# Patient Record
Sex: Male | Born: 1935
Health system: Southern US, Community
[De-identification: ages and names within clinical notes are randomized; demographics above are authoritative.]

## PROBLEM LIST (undated history)

## (undated) DIAGNOSIS — I4891 Unspecified atrial fibrillation: Secondary | ICD-10-CM

## (undated) DIAGNOSIS — R51 Headache: Secondary | ICD-10-CM

## (undated) DIAGNOSIS — K219 Gastro-esophageal reflux disease without esophagitis: Secondary | ICD-10-CM

## (undated) DIAGNOSIS — F419 Anxiety disorder, unspecified: Secondary | ICD-10-CM

## (undated) DIAGNOSIS — R200 Anesthesia of skin: Secondary | ICD-10-CM

## (undated) DIAGNOSIS — R202 Paresthesia of skin: Secondary | ICD-10-CM

## (undated) DIAGNOSIS — J984 Other disorders of lung: Secondary | ICD-10-CM

## (undated) DIAGNOSIS — I10 Essential (primary) hypertension: Secondary | ICD-10-CM

## (undated) DIAGNOSIS — Z720 Tobacco use: Secondary | ICD-10-CM

## (undated) DIAGNOSIS — J189 Pneumonia, unspecified organism: Secondary | ICD-10-CM

## (undated) DIAGNOSIS — J9601 Acute respiratory failure with hypoxia: Secondary | ICD-10-CM

## (undated) DIAGNOSIS — C3490 Malignant neoplasm of unspecified part of unspecified bronchus or lung: Secondary | ICD-10-CM

## (undated) DIAGNOSIS — J449 Chronic obstructive pulmonary disease, unspecified: Secondary | ICD-10-CM

## (undated) DIAGNOSIS — E7800 Pure hypercholesterolemia, unspecified: Secondary | ICD-10-CM

## (undated) DIAGNOSIS — C189 Malignant neoplasm of colon, unspecified: Secondary | ICD-10-CM

## (undated) DIAGNOSIS — J439 Emphysema, unspecified: Secondary | ICD-10-CM

## (undated) HISTORY — PX: EYE SURGERY: SHX253

## (undated) HISTORY — PX: APPENDECTOMY: SHX54

## (undated) HISTORY — PX: CERVICAL DISC SURGERY: SHX588

## (undated) HISTORY — DX: Malignant neoplasm of colon, unspecified: C18.9

## (undated) HISTORY — DX: Malignant neoplasm of unspecified part of unspecified bronchus or lung: C34.90

## (undated) HISTORY — PX: COLON SURGERY: SHX602

## (undated) HISTORY — PX: TONSILLECTOMY: SUR1361

---

## 1991-03-04 DIAGNOSIS — C189 Malignant neoplasm of colon, unspecified: Secondary | ICD-10-CM

## 1991-03-04 HISTORY — DX: Malignant neoplasm of colon, unspecified: C18.9

## 2002-08-16 ENCOUNTER — Ambulatory Visit (HOSPITAL_COMMUNITY): Admission: RE | Admit: 2002-08-16 | Discharge: 2002-08-16 | Payer: Self-pay | Admitting: Internal Medicine

## 2002-09-21 ENCOUNTER — Ambulatory Visit (HOSPITAL_COMMUNITY): Admission: RE | Admit: 2002-09-21 | Discharge: 2002-09-21 | Payer: Self-pay | Admitting: Family Medicine

## 2002-09-21 ENCOUNTER — Encounter: Payer: Self-pay | Admitting: Family Medicine

## 2002-10-17 ENCOUNTER — Encounter: Payer: Self-pay | Admitting: Neurosurgery

## 2002-10-18 ENCOUNTER — Inpatient Hospital Stay (HOSPITAL_COMMUNITY): Admission: RE | Admit: 2002-10-18 | Discharge: 2002-10-20 | Payer: Self-pay | Admitting: Neurosurgery

## 2002-10-18 ENCOUNTER — Encounter: Payer: Self-pay | Admitting: Neurosurgery

## 2003-09-29 ENCOUNTER — Ambulatory Visit (HOSPITAL_COMMUNITY): Admission: RE | Admit: 2003-09-29 | Discharge: 2003-09-29 | Payer: Self-pay | Admitting: Neurosurgery

## 2005-09-15 ENCOUNTER — Ambulatory Visit (HOSPITAL_COMMUNITY): Admission: RE | Admit: 2005-09-15 | Discharge: 2005-09-15 | Payer: Self-pay | Admitting: Family Medicine

## 2007-09-30 ENCOUNTER — Ambulatory Visit: Payer: Self-pay | Admitting: Internal Medicine

## 2007-09-30 ENCOUNTER — Ambulatory Visit (HOSPITAL_COMMUNITY): Admission: RE | Admit: 2007-09-30 | Discharge: 2007-09-30 | Payer: Self-pay | Admitting: Internal Medicine

## 2007-09-30 ENCOUNTER — Encounter: Payer: Self-pay | Admitting: Internal Medicine

## 2008-01-11 ENCOUNTER — Ambulatory Visit (HOSPITAL_COMMUNITY): Admission: RE | Admit: 2008-01-11 | Discharge: 2008-01-11 | Payer: Self-pay | Admitting: Ophthalmology

## 2008-01-25 ENCOUNTER — Ambulatory Visit (HOSPITAL_COMMUNITY): Admission: RE | Admit: 2008-01-25 | Discharge: 2008-01-25 | Payer: Self-pay | Admitting: Ophthalmology

## 2009-01-17 ENCOUNTER — Ambulatory Visit (HOSPITAL_COMMUNITY): Admission: RE | Admit: 2009-01-17 | Discharge: 2009-01-17 | Payer: Self-pay | Admitting: Family Medicine

## 2009-10-23 ENCOUNTER — Ambulatory Visit (HOSPITAL_COMMUNITY): Admission: RE | Admit: 2009-10-23 | Discharge: 2009-10-23 | Payer: Self-pay | Admitting: Ophthalmology

## 2009-12-18 ENCOUNTER — Ambulatory Visit (HOSPITAL_COMMUNITY): Admission: RE | Admit: 2009-12-18 | Discharge: 2009-12-18 | Payer: Self-pay | Admitting: Ophthalmology

## 2010-07-16 NOTE — Op Note (Signed)
NAME:  Adam Ward, Adam Ward                  ACCOUNT NO.:  [id number]   MEDICAL RECORD NO.:  [id number]          PATIENT TYPE:  AMB   LOCATION:  DAY                           FACILITY:  [facility]   PHYSICIAN:  R. Roetta Sessions, M.D. DATE OF BIRTH:  November 06, 1935   DATE OF PROCEDURE:  09/30/2007  DATE OF DISCHARGE:                               OPERATIVE REPORT   INDICATIONS FOR PROCEDURE:  A 75 year old gentleman with history of left-  sided colon cancer, status post segmental resection back in 1993 by Dr.  Malvin Johns.  He has done well, had surveillance examination in 2004 by Dr.  Ferne Reus without significant findings.  He has no lower GI tract symptoms.  Colonoscopy is now being done as a surveillance maneuver.  Risks,  benefits, alternatives, and limitations have been reviewed, and  questions were answered.  He is agreeable.  Please see the documentation  in the medical record.   PROCEDURE NOTE:  O2 saturation, blood pressure, pulse, and respirations  were monitored throughout the entire procedure.   CONSCIOUS SEDATION:  IV Versed and Demerol in incremental doses.   INSTRUMENT:  Pentax video chip system.   FINDINGS:  Digital rectal exam revealed no abnormalities.  Endoscopic  findings:  Prep was adequate.  Colon:  Colonic mucosa was surveyed from  the rectosigmoid junction through the colon all the way to the cecum.  The cecum, ileocecal valve, and appendiceal orifice were well seen and  photographed for the record.  Terminal ileum was then measured at 5 cm  from this level.  The scope was slowly and cautiously withdrawn.  All  previously mentioned mucosal surfaces were again seen.  The patient had  2 diminutive polyps just distal to the ileocecal valve, which were cold  biopsied/removed.  The patient had scattered distal diverticula.  The  surgical anatomosis was identified approximately at 25 cm, appeared  entirely normal as did the remainder of the colonic mucosa.  The scope  was pulled down to the  rectum with thorough examination of rectal mucosa  including retroflexed view of the anal verge demonstrated anal papilla.  The patient tolerated the procedure well and was reacted in Endoscopy.   IMPRESSION:  1. Anal papilla, otherwise normal rectum.  2. Distal colonic diverticula.  Surgical anatomosis, 25 cm diminutive      colonic polyps at the ileocecal valve, status post cold biopsy      removal.  3. Residual colonic mucosa and terminal ileum mucosa appeared normal.   RECOMMENDATIONS:  1. Diverticulosis literature provided to Adam Ward.  2. Follow up on path.  3. Further recommendations to follow.      Adam Ward, M.D.  Electronically Signed     RMR/MEDQ  D:  09/30/2007  T:  10/01/2007  Job:  [id number]   cc:   Patrica Duel, M.D.  Fax: [phone]   Pattricia Boss, M.D.  Fax: [phone]

## 2010-07-19 NOTE — Op Note (Signed)
NAME:  Adam Ward, Adam Ward                            ACCOUNT NO.:  [id number]   MEDICAL RECORD NO.:  [id number]                   PATIENT TYPE:  INP   LOCATION:  3008                                 FACILITY:  [facility]   PHYSICIAN:  Clydene Fake, M.D.               DATE OF BIRTH:  27-May-1935   DATE OF PROCEDURE:  10/18/2002  DATE OF DISCHARGE:                                 OPERATIVE REPORT   PREOPERATIVE DIAGNOSIS:  Herniated nucleus pulposus, spondylosis, stenosis  and cord compression with myelopathy, C3 through C7.   POSTOPERATIVE DIAGNOSIS:  Herniated nucleus pulposus, spondylosis, stenosis  and cord compression with myelopathy, C3 through C7.   OPERATION PERFORMED:  Anterior cervical decompression diskectomy and fusion  C3-4, 4-5, 5-6, 6-7 with LifeNet allograft bone and Premiere anterior  cervical plate from C3 through 7.   SURGEON:  Clydene Fake, M.D.   ASSISTANT:  Payton Doughty, M.D.   ANESTHESIA:  General endotracheal.   ESTIMATED BLOOD LOSS:  Minimal.   BLOOD REPLACED:  None.   DRAINS:  None.   COMPLICATIONS:  None.   INDICATIONS FOR PROCEDURE:  The patient is a 75 year old gentleman who has  been having trouble using the arms to walk with numbness and tingling in the  arms and legs progressing over the last couple of years with sloppiness in  his hands.  He did have a prior injury 30 years ago having 30 minutes of  paralysis of the legs.  Even after that he was always a little clumsy but he  was able to work and these new problems have been slowly progressive over  the last couple of years and over the last six months but progressively  more.  MRI was done showing spondylosis, cervical stenosis, cord  compression, with some cord change at 3-4 and significant changes also and  stenosis also at 4-5, 5-6 and 6-7.  Patient brought in for decompression and  fusion of the cervical spine.  Patient was myelopathic on exam.   DESCRIPTION OF PROCEDURE:  The patient was  brought to the operating room.  General anesthesia was induced.  Patient was placed in 10 pounds of halter  traction, prepped and draped in sterile fashion.  The site of incision was  injected with 19mL of 1% lidocaine with epinephrine.  Incision was then made  over the anterior border of the sternocleidomastoid muscle on the left side  of the neck in an oblique fashion.  Incision taken down to the platysma.  Hemostasis obtained with Bovie cautery.  Platysma was incised with a Bovie  and blunt dissection was taken through the anterior cervical fascia to the  anterior cervical spine.  Needle was placed in the two interspaces.  X-ray  was performed showing this was the 4-5 and 5-6 interspaces.  Disk spaces  were incised and partial diskectomy was performed with pituitary rongeurs as  the  needles were removed to mark these interspaces.  The longus colli muscle  was then reflected laterally on each side using the Bovie and self-retaining  retractor system was placed centered over the 3-4 and 4-5 interspaces to  start with.  Distraction pins were placed in C3 and C5, interspace  distracted, osteophytes removed with Leksell rongeurs and osteophyte  removers and high speed drill.  Interspaces then had diskectomy performed  with pituitary rongeurs and curets and high speed drill.  Posterior  osteophytes and very thickened posterior longitudinal ligament was removed  with 1 and 2 mm Kerrison punches and bilateral foraminotomies were  performed.  This was done at 3-4 and then at 4-5.  When we were finished, we  had good central decompression of the cord and bilateral foraminotomies.  A  high speed drill was used to remove the cartilaginous end plates at both  levels.  Interspaces were measured with LifeNet bone trials and they  measured to be 7 mm wide at both levels.  Wound irrigated with antibiotic  solution.  Two 7 mm LifeNet bone grafts were then tapped into place  countersunk a couple of  millimeters in both the 3-4 and 4-5 levels.  We  checked behind the bone graft and there was plenty of room between the bone  graft and dura at both levels.  Distraction pin was removed from C3 and  placed into C7, removed the retractor to recenter it over the 5-6 and 6-7  interspaces and then distracted over those two interspaces.  We incised  those two disk spaces with a 15 blade and partial diskectomy performed with  a pituitary rongeurs and curets.  A high speed drill was used to remove  cartilaginous end plates and drill  down to the posterior longitudinal  ligament then the 1 and 2 mm Kerrison punches were used to complete the  diskectomy and decompression to remove the posterior ligaments and perform  bilateral foraminotomies. When we were finished we had good central  decompression and good foraminotomies bilaterally at both levels.  We  measured the interspaces for size of bone grafts and they measured for 6 mm  grafts. A 6 mm graft was then tapped into the 5-6 space and then into the 6-  7 space, countersunk a couple of millimeters and we did check behind the  bone graft and there was plenty of room between bone and dura bilaterally.  We irrigated with antibiotic solution.  Hemostasis obtained with Gelfoam and  Thrombin and irrigated out.  Distraction pins were removed.  Hemostasis in  the bone obtained with Gelfoam and thrombin.  We removed some more  osteophytes so the plate would lie flat and then a Premiere anterior  cervical plate was placed over the anterior cervical spine and two screws  were placed in C7 and two screws were placed into C3.  We then placed two  screws into C4 and C6.  The locking mechanism was pushed back cephalad an  the locking screws tightened.  Wound was irrigated with antibiotic solution,  retraction devices were removed.  Hemostasis obtained with bipolar cautery. Wound was irrigated with antibiotic solution.  X-rays obtained showing good  position of   plate, screws, bone graft at C3-4, 4-5, 5-6 and 6-7.  When we  had good hemostasis the platysma was closed with 3-0 Vicryl interrupted  suture.  The subcutaneous tissue was closed with the same and the skin  closed with benzoin and Steri-Strips.  Dressing was placed.  The patient was  placed into a hard cervical collar, awakened from anesthesia and transferred  to the recovery room in stable condition.                                                Clydene Fake, M.D.    JRH/MEDQ  D:  10/18/2002  T:  10/18/2002  Job:  [id number]

## 2010-07-19 NOTE — Op Note (Signed)
NAME:  Adam Ward, Adam Ward                            ACCOUNT NO.:  [id number]   MEDICAL RECORD NO.:  [id number]                   PATIENT TYPE:  AMB   LOCATION:  DAY                                  FACILITY:  [facility]   PHYSICIAN:  Lionel December, M.D.                 DATE OF BIRTH:  18-May-1935   DATE OF PROCEDURE:  08/16/2002  DATE OF DISCHARGE:                                 OPERATIVE REPORT   PROCEDURE:  Total colonoscopy.   INDICATIONS FOR PROCEDURE:  Kole is a 75 year old Caucasian male who is  here for surveillance colonoscopy.  He has history of colon carcinoma.  He  had a sigmoid colon resection back in 1993.  He also had a few small polyps  removed.  His last exam was in May, 2000.  He is presently free of GI  symptoms.  The procedure was reviewed with the patient, and informed consent  was obtained.   PREOPERATIVE MEDICATIONS:  Demerol 45 mg IV, Versed 3 mg IV in divided  doses.   FINDINGS:  The procedure was performed in the endoscopy suite.  The  patient's vital signs and O2 saturations were monitored during the procedure  and remained stable.  The patient was placed in the left lateral recumbent  position and rectal examination performed.  No abnormality noted on external  or digital exam.  The Olympus videoduodenoscope was placed into the rectum  and advanced to the region of the sigmoid colon.  The colonic anastomosis  was noted at 25 cm from the anal margin and was wide open.  He had a few  scattered diverticula mainly at his sigmoid, descending, and transverse  colon.  The preparation was satisfactory.  The scope was passed into the  cecum which was identified by the appendiceal orifice and ileocecal valve.  Pictures were taken for the record.  As the scope was withdrawn, the colonic  mucosa was once again carefully examined.  There were no polyps or tumor  masses.  The rectal mucosa similarly was normal.  The scope was retroflexed  to examine the anorectal junction.  Prominent anal papillae were noted along  with hemorrhoids below the dentate line.  The endoscope was straightened and  withdrawn.  The patient tolerated the procedure well.   FINAL DIAGNOSES:  1. Examination performed to the cecum.  2. Scattered diverticula mainly at sigmoid, descending and transverse colon.  3. Wide open colonic anastomosis at 25 cm from the anal margin.  4. Prominent anal papillae and external hemorrhoids.   RECOMMENDATIONS:  1. High fiber diet.  2.     Citrucel or equivalent, one tablespoon full daily.  3. He will resume his ASA and other medications as before.  4. He may consider his next colonoscopy in five years.  Lionel December, M.D.    NR/MEDQ  D:  08/16/2002  T:  08/16/2002  Job:  [id number]   cc:   Barbaraann Barthel, M.D.  [address]  [place]  [place] [address]  Fax: [phone]   Patrica Duel, M.D.  [address], [address]  [place]  [place] [address]  Fax: [phone]

## 2010-12-03 LAB — HEMOGLOBIN AND HEMATOCRIT, BLOOD: HCT: 49.5

## 2010-12-03 LAB — BASIC METABOLIC PANEL
CO2: 32
Calcium: 9.4
Creatinine, Ser: 0.96
GFR calc Af Amer: >60

## 2011-07-26 ENCOUNTER — Inpatient Hospital Stay (HOSPITAL_COMMUNITY)
Admission: EM | Admit: 2011-07-26 | Discharge: 2011-08-01 | DRG: 871 | Disposition: A | Discharge status: Home / Self Care | Payer: Medicare Other | Attending: Internal Medicine | Admitting: Internal Medicine

## 2011-07-26 ENCOUNTER — Encounter (HOSPITAL_COMMUNITY): Payer: Self-pay | Admitting: *Deleted

## 2011-07-26 ENCOUNTER — Emergency Department (HOSPITAL_COMMUNITY): Payer: Medicare Other

## 2011-07-26 DIAGNOSIS — I10 Essential (primary) hypertension: Secondary | ICD-10-CM | POA: Diagnosis present

## 2011-07-26 DIAGNOSIS — J984 Other disorders of lung: Secondary | ICD-10-CM | POA: Diagnosis present

## 2011-07-26 DIAGNOSIS — R509 Fever, unspecified: Secondary | ICD-10-CM | POA: Diagnosis present

## 2011-07-26 DIAGNOSIS — J690 Pneumonitis due to inhalation of food and vomit: Secondary | ICD-10-CM | POA: Diagnosis not present

## 2011-07-26 DIAGNOSIS — A419 Sepsis, unspecified organism: Secondary | ICD-10-CM | POA: Diagnosis not present

## 2011-07-26 DIAGNOSIS — R0902 Hypoxemia: Secondary | ICD-10-CM

## 2011-07-26 DIAGNOSIS — F419 Anxiety disorder, unspecified: Secondary | ICD-10-CM | POA: Diagnosis present

## 2011-07-26 DIAGNOSIS — J9601 Acute respiratory failure with hypoxia: Secondary | ICD-10-CM

## 2011-07-26 DIAGNOSIS — J9600 Acute respiratory failure, unspecified whether with hypoxia or hypercapnia: Secondary | ICD-10-CM | POA: Diagnosis present

## 2011-07-26 DIAGNOSIS — R739 Hyperglycemia, unspecified: Secondary | ICD-10-CM

## 2011-07-26 DIAGNOSIS — J189 Pneumonia, unspecified organism: Secondary | ICD-10-CM | POA: Diagnosis not present

## 2011-07-26 DIAGNOSIS — J4489 Other specified chronic obstructive pulmonary disease: Secondary | ICD-10-CM | POA: Diagnosis not present

## 2011-07-26 DIAGNOSIS — J439 Emphysema, unspecified: Secondary | ICD-10-CM | POA: Diagnosis not present

## 2011-07-26 DIAGNOSIS — J159 Unspecified bacterial pneumonia: Secondary | ICD-10-CM

## 2011-07-26 DIAGNOSIS — F411 Generalized anxiety disorder: Secondary | ICD-10-CM | POA: Diagnosis present

## 2011-07-26 DIAGNOSIS — R059 Cough, unspecified: Secondary | ICD-10-CM | POA: Diagnosis present

## 2011-07-26 DIAGNOSIS — Z7982 Long term (current) use of aspirin: Secondary | ICD-10-CM | POA: Diagnosis not present

## 2011-07-26 DIAGNOSIS — I2789 Other specified pulmonary heart diseases: Secondary | ICD-10-CM | POA: Diagnosis present

## 2011-07-26 DIAGNOSIS — F17200 Nicotine dependence, unspecified, uncomplicated: Secondary | ICD-10-CM | POA: Diagnosis present

## 2011-07-26 DIAGNOSIS — R918 Other nonspecific abnormal finding of lung field: Secondary | ICD-10-CM

## 2011-07-26 DIAGNOSIS — I4891 Unspecified atrial fibrillation: Secondary | ICD-10-CM | POA: Diagnosis present

## 2011-07-26 DIAGNOSIS — R5381 Other malaise: Secondary | ICD-10-CM | POA: Diagnosis present

## 2011-07-26 DIAGNOSIS — Z85038 Personal history of other malignant neoplasm of large intestine: Secondary | ICD-10-CM

## 2011-07-26 DIAGNOSIS — J440 Chronic obstructive pulmonary disease with acute lower respiratory infection: Secondary | ICD-10-CM | POA: Diagnosis not present

## 2011-07-26 DIAGNOSIS — C3410 Malignant neoplasm of upper lobe, unspecified bronchus or lung: Secondary | ICD-10-CM | POA: Diagnosis present

## 2011-07-26 DIAGNOSIS — R7989 Other specified abnormal findings of blood chemistry: Secondary | ICD-10-CM

## 2011-07-26 DIAGNOSIS — R7309 Other abnormal glucose: Secondary | ICD-10-CM | POA: Diagnosis present

## 2011-07-26 DIAGNOSIS — J209 Acute bronchitis, unspecified: Secondary | ICD-10-CM | POA: Diagnosis not present

## 2011-07-26 DIAGNOSIS — R0602 Shortness of breath: Secondary | ICD-10-CM

## 2011-07-26 DIAGNOSIS — Z720 Tobacco use: Secondary | ICD-10-CM

## 2011-07-26 DIAGNOSIS — R05 Cough: Secondary | ICD-10-CM | POA: Diagnosis present

## 2011-07-26 DIAGNOSIS — J988 Other specified respiratory disorders: Secondary | ICD-10-CM | POA: Diagnosis not present

## 2011-07-26 DIAGNOSIS — J449 Chronic obstructive pulmonary disease, unspecified: Secondary | ICD-10-CM | POA: Diagnosis not present

## 2011-07-26 DIAGNOSIS — J438 Other emphysema: Secondary | ICD-10-CM | POA: Diagnosis not present

## 2011-07-26 HISTORY — DX: Essential (primary) hypertension: I10

## 2011-07-26 HISTORY — DX: Chronic obstructive pulmonary disease, unspecified: J44.9

## 2011-07-26 HISTORY — DX: Other disorders of lung: J98.4

## 2011-07-26 HISTORY — DX: Acute respiratory failure with hypoxia: J96.01

## 2011-07-26 HISTORY — DX: Tobacco use: Z72.0

## 2011-07-26 HISTORY — DX: Pure hypercholesterolemia, unspecified: E78.00

## 2011-07-26 HISTORY — DX: Unspecified atrial fibrillation: I48.91

## 2011-07-26 HISTORY — DX: Emphysema, unspecified: J43.9

## 2011-07-26 HISTORY — DX: Anxiety disorder, unspecified: F41.9

## 2011-07-26 LAB — COMPREHENSIVE METABOLIC PANEL
ALT: 12 U/L (ref 0–53)
AST: 16 U/L (ref 0–37)
Albumin: 2.9 g/dL — ABNORMAL LOW (ref 3.5–5.2)
Alkaline Phosphatase: 70 U/L (ref 39–117)
Chloride: 95 mEq/L — ABNORMAL LOW (ref 96–112)
Potassium: 4 mEq/L (ref 3.5–5.1)
Sodium: 134 mEq/L — ABNORMAL LOW (ref 135–145)
Total Bilirubin: 0.2 mg/dL — ABNORMAL LOW (ref 0.3–1.2)
Total Protein: 6.9 g/dL (ref 6.0–8.3)

## 2011-07-26 LAB — DIFFERENTIAL
Basophils Absolute: 0 10*3/uL (ref 0.0–0.1)
Basophils Relative: 0 % (ref 0–1)
Eosinophils Absolute: 0 10*3/uL (ref 0.0–0.7)
Eosinophils Relative: 0 % (ref 0–5)
Monocytes Absolute: 1.6 10*3/uL — ABNORMAL HIGH (ref 0.1–1.0)
Neutro Abs: 8.7 10*3/uL — ABNORMAL HIGH (ref 1.7–7.7)

## 2011-07-26 LAB — CBC
HCT: 45.3 % (ref 39.0–52.0)
MCH: 31.6 pg (ref 26.0–34.0)
MCHC: 32.9 g/dL (ref 30.0–36.0)
RDW: 13 % (ref 11.5–15.5)

## 2011-07-26 LAB — TROPONIN I: Troponin I: <0.3 ng/mL (ref <0.30)

## 2011-07-26 LAB — URINE MICROSCOPIC-ADD ON

## 2011-07-26 LAB — PRO B NATRIURETIC PEPTIDE: Pro B Natriuretic peptide (BNP): 2495 pg/mL — ABNORMAL HIGH (ref 0–125)

## 2011-07-26 LAB — URINALYSIS, ROUTINE W REFLEX MICROSCOPIC
Bilirubin Urine: NEGATIVE
Specific Gravity, Urine: >1.03 — ABNORMAL HIGH (ref 1.005–1.030)
pH: 6 (ref 5.0–8.0)

## 2011-07-26 LAB — CK TOTAL AND CKMB (NOT AT ARMC): Relative Index: INVALID (ref 0.0–2.5)

## 2011-07-26 MED ORDER — SIMVASTATIN 20 MG PO TABS
20.0000 mg | ORAL_TABLET | Freq: Every day | ORAL | Status: DC
  Administered 2011-07-27 – 2011-07-28 (×2): 20 mg via ORAL
  Filled 2011-07-26 (×2): qty 1

## 2011-07-26 MED ORDER — SODIUM CHLORIDE 0.9 % IV SOLN
1000.0000 mL | INTRAVENOUS | Status: DC
  Administered 2011-07-26: 1000 mL via INTRAVENOUS

## 2011-07-26 MED ORDER — SODIUM CHLORIDE 0.9 % IV SOLN
1000.0000 mL | Freq: Once | INTRAVENOUS | Status: AC
  Administered 2011-07-26: 1000 mL via INTRAVENOUS

## 2011-07-26 MED ORDER — IPRATROPIUM BROMIDE 0.02 % IN SOLN
0.5000 mg | Freq: Once | RESPIRATORY_TRACT | Status: AC
  Administered 2011-07-26: 0.5 mg via RESPIRATORY_TRACT
  Filled 2011-07-26: qty 2.5

## 2011-07-26 MED ORDER — DEXTROSE 5 % IV SOLN
500.0000 mg | Freq: Once | INTRAVENOUS | Status: AC
  Administered 2011-07-26: 500 mg via INTRAVENOUS
  Filled 2011-07-26: qty 500

## 2011-07-26 MED ORDER — ACETAMINOPHEN 500 MG PO TABS
1000.0000 mg | ORAL_TABLET | Freq: Once | ORAL | Status: AC
  Administered 2011-07-26: 1000 mg via ORAL
  Filled 2011-07-26: qty 2

## 2011-07-26 MED ORDER — CEFTRIAXONE SODIUM IN DEXTROSE 40 MG/ML IV SOLN
2.0000 g | Freq: Once | INTRAVENOUS | Status: AC
  Administered 2011-07-26: 2 g via INTRAVENOUS
  Filled 2011-07-26: qty 50

## 2011-07-26 MED ORDER — POTASSIUM CHLORIDE IN NACL 20-0.9 MEQ/L-% IV SOLN
INTRAVENOUS | Status: DC
  Administered 2011-07-26: 1000 mL via INTRAVENOUS

## 2011-07-26 MED ORDER — DEXTROSE 5 % IV SOLN
500.0000 mg | INTRAVENOUS | Status: DC
  Administered 2011-07-27 – 2011-07-28 (×2): 500 mg via INTRAVENOUS
  Filled 2011-07-26 (×2): qty 500

## 2011-07-26 MED ORDER — METHYLPREDNISOLONE SODIUM SUCC 125 MG IJ SOLR
125.0000 mg | Freq: Once | INTRAMUSCULAR | Status: AC
  Administered 2011-07-26: 125 mg via INTRAVENOUS
  Filled 2011-07-26: qty 2

## 2011-07-26 MED ORDER — DEXTROSE 5 % IV SOLN
INTRAVENOUS | Status: AC
  Filled 2011-07-26: qty 2

## 2011-07-26 MED ORDER — ASPIRIN EC 81 MG PO TBEC
81.0000 mg | DELAYED_RELEASE_TABLET | Freq: Every day | ORAL | Status: DC
  Administered 2011-07-27 – 2011-08-01 (×6): 81 mg via ORAL
  Filled 2011-07-26 (×6): qty 1

## 2011-07-26 MED ORDER — DEXTROSE 5 % IV SOLN
1.0000 g | INTRAVENOUS | Status: DC
  Administered 2011-07-27: 1 g via INTRAVENOUS
  Filled 2011-07-26 (×2): qty 10

## 2011-07-26 MED ORDER — ALBUTEROL SULFATE (5 MG/ML) 0.5% IN NEBU
2.5000 mg | INHALATION_SOLUTION | RESPIRATORY_TRACT | Status: DC | PRN
Start: 2011-07-26 — End: 2011-07-27
  Administered 2011-07-27: 2.5 mg via RESPIRATORY_TRACT
  Filled 2011-07-26: qty 0.5

## 2011-07-26 MED ORDER — ALBUTEROL SULFATE (5 MG/ML) 0.5% IN NEBU
5.0000 mg | INHALATION_SOLUTION | Freq: Once | RESPIRATORY_TRACT | Status: AC
  Administered 2011-07-26: 5 mg via RESPIRATORY_TRACT
  Filled 2011-07-26: qty 1

## 2011-07-26 MED ORDER — METOPROLOL TARTRATE 50 MG PO TABS
50.0000 mg | ORAL_TABLET | Freq: Two times a day (BID) | ORAL | Status: DC
  Administered 2011-07-27: 50 mg via ORAL
  Filled 2011-07-26: qty 1

## 2011-07-26 NOTE — ED Notes (Signed)
In and out cath performed using aseptic teqnique. Pt tolerated well. urine obtained. Sample sent to lab

## 2011-07-26 NOTE — ED Notes (Signed)
Pt c/o chest congestion and weakness for a couple days. Has congested cough at times.

## 2011-07-26 NOTE — H&P (Signed)
Chief Complaint:  Shortness of breath  HPI: Pleasant 76 year old male who is not very forthcoming with any history and says that he is feeling perfectly fine those forced to come to the hospital by his wife because of worsening shortness of breath and cough for several days now. He denies any shortness of breath, chest pain, fevers, feeling poorly. However when you look at him he obviously appears sick and short of breath and was quite hypoxic in the emergency department with O2 sats in the 60s on room air. Per his wife he has been progressively getting weaker has been running fever she states that his mental status has been normal. But he definitely has not been himself and finally made him come to the hospital today. Review of systems is unreliable from the patient and negative due to a believe the patient's stoic nature.  Review of Systems:  Negative but unreliable  Past Medical History: Past Medical History  Diagnosis Date  . Hypertension   . High cholesterol   . Anxiety   . Cancer    Past Surgical History  Procedure Date  . Colon surgery   . Cervical disc surgery   . Tonsillectomy   . Appendectomy     Medications: Prior to Admission medications   Medication Sig Start Date End Date Taking? Authorizing Provider  alprazolam Prudy Feeler) 2 MG tablet Take 2 mg by mouth 3 (three) times daily.   Yes Historical Provider, MD  aspirin EC 81 MG tablet Take 81 mg by mouth daily.   Yes Historical Provider, MD  metoprolol (LOPRESSOR) 50 MG tablet Take 50 mg by mouth 2 (two) times daily.   Yes Historical Provider, MD  Multiple Vitamins-Minerals (MULTIVITAMIN WITH MINERALS) tablet Take 1 tablet by mouth daily.   Yes Historical Provider, MD  Omega-3 Fatty Acids (FISH OIL) 1000 MG CAPS Take 1 capsule by mouth daily.   Yes Historical Provider, MD  simvastatin (ZOCOR) 20 MG tablet Take 20 mg by mouth daily.   Yes Historical Provider, MD    Allergies:   Allergies  Allergen Reactions  . Celebrex  (Celecoxib)   . Tetracyclines & Related Hives    Social History:  reports that he has been smoking.  He does not have any smokeless tobacco history on file. He reports that he does not drink alcohol or use illicit drugs.  Family History: History reviewed. No pertinent family history.  Physical Exam: Filed Vitals:   07/26/11 2000 07/26/11 2012 07/26/11 2100 07/26/11 2200  BP: 161/69 161/69 175/68 154/70  Pulse: 120 133 121 113  Temp:    101.6 F (38.7 C)  TempSrc:    Rectal  Resp: 24 17 24 16   Height:      Weight:      SpO2: 97% 96% 96% 92%   BP 154/70  Pulse 113  Temp(Src) 100.2 F (37.9 C) (Oral)  Resp 16  Ht 6\' 2"  (1.88 m)  Wt 77.111 kg (170 lb)  BMI 21.83 kg/m2  SpO2 92% General appearance: alert, cooperative and mild distress Lungs: rhonchi bilaterally Heart: regular rate and rhythm, S1, S2 normal, no murmur, click, rub or gallop Abdomen: soft, non-tender; bowel sounds normal; no masses,  no organomegaly Extremities: extremities normal, atraumatic, no cyanosis or edema Pulses: 2+ and symmetric Skin: Skin color, texture, turgor normal. No rashes or lesions Neurologic: Grossly normal    Labs on Admission:   [facility] 07/26/11 1920  NA 134*  K 4.0  CL 95*  CO2 33*  GLUCOSE 165*  BUN 11  CREATININE 0.94  CALCIUM 8.7  MG --  PHOS --    Basename 07/26/11 1920  AST 16  ALT 12  ALKPHOS 70  BILITOT 0.2*  PROT 6.9  ALBUMIN 2.9*    Basename 07/26/11 1846  WBC 11.4*  NEUTROABS 8.7*  HGB 14.9  HCT 45.3  MCV 96.0  PLT 170    Basename 07/26/11 1920  CKTOTAL --  CKMB --  CKMBINDEX --  TROPONINI <0.30    Radiological Exams on Admission: Dg Chest Portable 1 View  07/26/2011  *RADIOLOGY REPORT*  Clinical Data: Sepsis.  Weakness.  PORTABLE CHEST - 1 VIEW  Comparison: 01/17/2009  Findings: Changes of COPD again demonstrated.  Right upper lobe scarring remains stable.  No evidence of acute infiltrate or edema. No evidence of pleural effusion.  Heart  size and mediastinal contours are normal.  IMPRESSION: Stable COPD.  No active disease.  Original Report Authenticated By: Danae Orleans, M.D.    Assessment/Plan Present on Admission:  76 year old male with likely community acquired pneumonia and early sepsis  .Fever .Cough .CAP (community acquired pneumonia) .Acute respiratory failure with hypoxia .Hypertension  Although his chest x-ray is negative he is running a fever has a significant cough and hypoxic respiratory failure and does appear somewhat toxic. Clinically he definitely has pneumonia. Blood cultures have been sent. Patient will be placed on Rocephin and azithromycin. His oxygen Sat are in the 90s on 4 L nasal cannula. He can speak in full sentences but is short of breath. We'll monitor very closely in step down. He is a full code and agrees to this if needed and only wishes temporary extreme measures. His wife and his daughter have been updated and they understand that the next 24-48 hours he can either improve or worsen. He was wheezing significantly on arrival to emergency department and did receive Solu-Medrol. He has no known history of COPD. He currently is not wheezing at all so we'll hold off on any further steroids and reassess in the morning. Due to the patient's very vague history we'll also cereal his cardiac enzymes and check a BNP he has no known cardiac issues as of yet. Further recommendations depending on response to the above.  Kambrey Hagger A [phone] 07/26/2011, 10:18 PM

## 2011-07-26 NOTE — ED Notes (Signed)
Notified dr. Fonnie Jarvis that patient's heart rate elevated up to 162. Dr. Fonnie Jarvis stated to continue giving patient fluids and continue to monitor him.

## 2011-07-26 NOTE — ED Provider Notes (Signed)
History   This chart was scribed for Adam Cousin, MD by Toya Smothers. The patient was seen in room IC08/IC08-01. Patient's care was started at 1740.  CSN: [id number]  Arrival date & time 07/26/11  1740   First MD Initiated Contact with Patient 07/26/11 1828      Chief Complaint  Patient presents with  . Weakness    The history is provided by the patient and the spouse. No language interpreter was used.    Pt. List PCP as Dr. Gordy Savers Gehling is a 76 y.o. male who presents to the Emergency Department complaining of gradual onset constant moderate severe weakness onset 3 days ago with associated SOB, moderate cough, and fatigue. Pt denies the confusion, chest pain, emesis, congestion, a h/o asthma and the use of an inhaler, along with the history of MI. The patient is currently living with his wife and list a h/o cancer. No Hx COPD or inhalers but does smoke.  Past Medical History  Diagnosis Date  . Hypertension   . High cholesterol   . Anxiety   . Cancer 1993    colon  . Rapid atrial fibrillation 07/28/2011    Past Surgical History  Procedure Date  . Colon surgery   . Cervical disc surgery   . Tonsillectomy   . Appendectomy     Family History  Problem Relation Age of Onset  . Heart disease Mother   . Stroke Father   . Pneumonia Sister   . Heart disease Brother     History  Substance Use Topics  . Smoking status: Current Everyday Smoker -- 0.5 packs/day  . Smokeless tobacco: Not on file  . Alcohol Use: No      Review of Systems  Constitutional: Negative for fever.       10 Systems reviewed and are negative for acute change except as noted in the HPI.  HENT: Negative for congestion.   Eyes: Negative for discharge and redness.  Respiratory: Negative for cough and shortness of breath.   Cardiovascular: Negative for chest pain.  Gastrointestinal: Negative for nausea, vomiting, abdominal pain and diarrhea.  Musculoskeletal: Negative for back pain.    Skin: Negative for rash.  Neurological: Positive for weakness. Negative for syncope, numbness and headaches.  Psychiatric/Behavioral: Negative for confusion.       No behavior change.    Allergies  Celebrex and Tetracyclines & related  Home Medications  No current outpatient prescriptions on file.  BP 158/61  Pulse 125  Temp(Src) 100.2 F (37.9 C) (Oral)  Resp 20  Ht 6\' 2"  (1.88 m)  Wt 170 lb (77.111 kg)  BMI 21.83 kg/m2  SpO2 66%  Physical Exam  Nursing note and vitals reviewed. Constitutional: He is oriented to person, place, and time.       Awake, alert, nontoxic appearance with baseline speech for patient.  HENT:  Head: Atraumatic.  Mouth/Throat: No oropharyngeal exudate.  Eyes: Right eye exhibits no discharge. Left eye exhibits no discharge.  Neck: Neck supple.  Cardiovascular: Regular rhythm and normal heart sounds.   No murmur heard.      Lum pulse oxidation saturation of 66%, Tachyacardic  Pulmonary/Chest: Effort normal. No stridor. No respiratory distress. He has wheezes. He has no rales. He exhibits no tenderness.  Abdominal: Soft. Bowel sounds are normal. He exhibits no mass. There is no tenderness. There is no rebound.  Musculoskeletal: He exhibits no tenderness.       Baseline ROM, moves extremities with no  obvious new focal weakness.  Lymphadenopathy:    He has no cervical adenopathy.  Neurological: He is alert and oriented to person, place, and time.  Skin: No rash noted.  Psychiatric: He has a normal mood and affect.    ED Course  Procedures (including critical care time) ECG: Sinus tachycardia, ventricular rate 122, normal axis, premature atrial complexes, incomplete right bundle branch block, no acute ischemic changes noted, compared with November 2009 rate is now faster DIAGNOSTIC STUDIES: Oxygen Saturation is 66% on [place], low by my interpretation.    COORDINATION OF CARE: 6:35PM- Discussed low oxygen saturation levels and admittance 7:37PM-  Reviewed vitals with nurses Pulse ox in 90s with nasal O2.  CRITICAL CARE Performed by: Hurman Horn   Total critical care time:  Critical care time was exclusive of separately billable procedures and treating other patients.  Critical care was necessary to treat or prevent imminent or life-threatening deterioration.  Critical care was time spent personally by me on the following activities: development of treatment plan with patient and/or surrogate as well as nursing, discussions with consultants, evaluation of patient's response to treatment, examination of patient, obtaining history from patient or surrogate, ordering and performing treatments and interventions, ordering and review of laboratory studies, ordering and review of radiographic studies, pulse oximetry and re-evaluation of patient's condition. Labs Reviewed  CBC - Abnormal; Notable for the following:    WBC 11.4 (*)    All other components within normal limits  DIFFERENTIAL - Abnormal; Notable for the following:    Neutro Abs 8.7 (*)    Lymphocytes Relative 9 (*)    Monocytes Relative 14 (*)    Monocytes Absolute 1.6 (*)    All other components within normal limits  COMPREHENSIVE METABOLIC PANEL - Abnormal; Notable for the following:    Sodium 134 (*)    Chloride 95 (*)    CO2 33 (*)    Glucose, Bld 165 (*)    Albumin 2.9 (*)    Total Bilirubin 0.2 (*)    GFR calc non Af Amer 81 (*)    All other components within normal limits  URINALYSIS, ROUTINE W REFLEX MICROSCOPIC - Abnormal; Notable for the following:    APPearance HAZY (*)    Specific Gravity, Urine >1.030 (*)    Glucose, UA 100 (*)    Hgb urine dipstick SMALL (*)    Protein, ur 100 (*)    All other components within normal limits  URINE MICROSCOPIC-ADD ON - Abnormal; Notable for the following:    Bacteria, UA MANY (*)    Casts HYALINE CASTS (*)    All other components within normal limits  BASIC METABOLIC PANEL - Abnormal; Notable for the  following:    Glucose, Bld 196 (*)    Calcium 7.9 (*)    GFR calc non Af Amer 87 (*)    All other components within normal limits  PRO B NATRIURETIC PEPTIDE - Abnormal; Notable for the following:    Pro B Natriuretic peptide (BNP) 2495.0 (*)    All other components within normal limits  D-DIMER, QUANTITATIVE - Abnormal; Notable for the following:    D-Dimer, Quant 2.50 (*)    All other components within normal limits  HEMOGLOBIN A1C - Abnormal; Notable for the following:    Hemoglobin A1C 6.0 (*)    Mean Plasma Glucose 126 (*)    All other components within normal limits  CBC - Abnormal; Notable for the following:    WBC 11.3 (*)  RBC 4.10 (*)    Hemoglobin 12.7 (*)    All other components within normal limits  BASIC METABOLIC PANEL - Abnormal; Notable for the following:    CO2 33 (*)    Glucose, Bld 141 (*)    GFR calc non Af Amer 87 (*)    All other components within normal limits  GLUCOSE, CAPILLARY - Abnormal; Notable for the following:    Glucose-Capillary 146 (*)    All other components within normal limits  GLUCOSE, CAPILLARY - Abnormal; Notable for the following:    Glucose-Capillary 124 (*)    All other components within normal limits  GLUCOSE, CAPILLARY - Abnormal; Notable for the following:    Glucose-Capillary 110 (*)    All other components within normal limits  GLUCOSE, CAPILLARY - Abnormal; Notable for the following:    Glucose-Capillary 106 (*)    All other components within normal limits  GLUCOSE, CAPILLARY - Abnormal; Notable for the following:    Glucose-Capillary 110 (*)    All other components within normal limits  CULTURE, BLOOD (ROUTINE X 2)  CULTURE, BLOOD (ROUTINE X 2)  LACTIC ACID, PLASMA  PROCALCITONIN  URINE CULTURE  TROPONIN I  MRSA PCR SCREENING  LEGIONELLA ANTIGEN, URINE  STREP PNEUMONIAE URINARY ANTIGEN  CARDIAC PANEL(CRET KIN+CKTOT+MB+TROPI)  CK TOTAL AND CKMB  CULTURE, RESPIRATORY  CULTURE, EXPECTORATED SPUTUM-ASSESSMENT  GRAM  STAIN  TSH  AFB CULTURE WITH SMEAR   Ct Angio Chest W/cm &/or Wo Cm  07/27/2011  *RADIOLOGY REPORT*  Clinical Data: 76 year old with hypoxia and elevated D-dimer.  CT ANGIOGRAPHY CHEST  Technique:  Multidetector CT imaging of the chest using the standard protocol during bolus administration of intravenous contrast. Multiplanar reconstructed images including MIPs were obtained and reviewed to evaluate the vascular anatomy.  Contrast: OMNIPAQUE IOHEXOL 350 MG/ML SOLN  Comparison: Chest radiograph 07/27/2011  Findings: There is no evidence for pulmonary embolism.  There is edema in the upper abdomen, particularly near the kidneys.  There is diffuse atherosclerotic disease involving the thoracic aorta but no evidence for a dissection.  There is a small amount of pleural thickening or fluid bilaterally.  Prominent prevascular lymph node measuring 0.9 cm on sequence #4, image 39.  Precarinal lymph node measures 0.9 cm in the short axis.  Subcarinal lymph node measures 1.5 cm on image 47.  The trachea and mainstem bronchi are patent. There is diffuse peribronchial thickening but this is most prominent in the right lower lobe and right middle lobe.  In fact, there is narrowing of the right lower lobe bronchus.  Diffuse interstitial thickening and parenchymal nodularity in the right lower lobe.  Subtle small nodular densities in the lower right upper lobe.  Emphysematous changes with irregular cavitary or bullous lesions in the right upper lobe.  Questionable 7 mm nodule in the right upper lobe on sequence five, image 35.  There appears to be scarring at the left lung apex.  There are pleural-based densities along the dependent aspect of the left lower lobe which is indeterminate but could be related to atelectasis.  Mild peripheral nodularity in the left lower lobe.  No acute bony abnormality.  IMPRESSION: Negative for acute pulmonary embolism.  Diffuse peribronchial thickening, particularly in the right lower  lobe where there is narrowing of the right lower lobe bronchus. There is diffuse interstitial and nodular disease in the right lower lung.  Findings are suspicious for an infectious etiology. There are similar more subtle findings in the right lower lobe and left  lower lobe.  There are scattered irregular opacities with bulla/cavitary formations throughout the right upper lung.  There is mild mediastinal lymphadenopathy, particularly in the subcarinal station.  Recommend follow-up Chest CT within 2-3 months to evaluate the irregular opacities and possible cavitary lesions in the right upper lung.  In addition, the chest lymphadenopathy should be followed.  Original Report Authenticated By: Richarda Overlie, M.D.   Dg Chest Port 1 View  07/27/2011  *RADIOLOGY REPORT*  Clinical Data: Evaluate pneumonia.  PORTABLE CHEST - 1 VIEW  Comparison: 07/26/2011 01/17/2009  Findings: There is slight respiratory motion at the lung bases. Underlying changes of emphysema are noted.  Probable stable scarring laterally in the right upper lobe, similar dating back to 2010.  There is a developing faint opacity at the right lung base, suspicious for pneumonia.  The left lung is clear.  No visible pneumothorax or pleural effusion.  Postsurgical changes of the lower cervical spine.  No acute bony abnormality.  IMPRESSION: Developing right basilar opacity suspicious for pneumonia. Emphysema.  Original Report Authenticated By: Britta Mccreedy, M.D.     1. Sepsis   2. Bronchitis with bronchospasm   3. Hypoxia      MDM  I personally performed the services described in this documentation, which was scribed in my presence. The recorded information has been reviewed and considered. Pt feels improved after observation and/or treatment in ED.Patient / Family / Caregiver informed of clinical course, understand medical decision-making process, and agree with plan.The patient appears reasonably stabilized for admission considering the current  resources, flow, and capabilities available in the ED at this time, and I doubt any other [facility] requiring further screening and/or treatment in the ED prior to admission.   Hurman Horn, MD 07/28/11 [id number]

## 2011-07-26 NOTE — ED Notes (Signed)
Attempted to call report on patient to icu, pam stated Adam Ward would call me back because she is in with a patient.

## 2011-07-27 ENCOUNTER — Inpatient Hospital Stay (HOSPITAL_COMMUNITY): Payer: Medicare Other

## 2011-07-27 DIAGNOSIS — J9600 Acute respiratory failure, unspecified whether with hypoxia or hypercapnia: Secondary | ICD-10-CM

## 2011-07-27 DIAGNOSIS — J159 Unspecified bacterial pneumonia: Secondary | ICD-10-CM

## 2011-07-27 DIAGNOSIS — R739 Hyperglycemia, unspecified: Secondary | ICD-10-CM | POA: Diagnosis present

## 2011-07-27 DIAGNOSIS — R7989 Other specified abnormal findings of blood chemistry: Secondary | ICD-10-CM | POA: Diagnosis present

## 2011-07-27 LAB — HEMOGLOBIN A1C: Mean Plasma Glucose: 126 mg/dL — ABNORMAL HIGH (ref <117)

## 2011-07-27 LAB — BASIC METABOLIC PANEL
CO2: 31 mEq/L (ref 19–32)
Calcium: 7.9 mg/dL — ABNORMAL LOW (ref 8.4–10.5)
Chloride: 100 mEq/L (ref 96–112)
Creatinine, Ser: 0.8 mg/dL (ref 0.50–1.35)
GFR calc Af Amer: >90 mL/min (ref >90)
Sodium: 135 mEq/L (ref 135–145)

## 2011-07-27 LAB — CARDIAC PANEL(CRET KIN+CKTOT+MB+TROPI)
Relative Index: INVALID (ref 0.0–2.5)
Total CK: 26 U/L (ref 7–232)

## 2011-07-27 LAB — STREP PNEUMONIAE URINARY ANTIGEN: Strep Pneumo Urinary Antigen: NEGATIVE

## 2011-07-27 MED ORDER — ALPRAZOLAM 0.25 MG PO TABS
0.2500 mg | ORAL_TABLET | Freq: Four times a day (QID) | ORAL | Status: DC | PRN
  Administered 2011-07-27 (×2): 0.25 mg via ORAL
  Filled 2011-07-27 (×2): qty 1

## 2011-07-27 MED ORDER — SODIUM CHLORIDE 0.9 % IJ SOLN
INTRAMUSCULAR | Status: AC
  Filled 2011-07-27: qty 3

## 2011-07-27 MED ORDER — FUROSEMIDE 40 MG PO TABS
40.0000 mg | ORAL_TABLET | Freq: Once | ORAL | Status: AC
  Administered 2011-07-27: 40 mg via ORAL
  Filled 2011-07-27: qty 1

## 2011-07-27 MED ORDER — ENOXAPARIN SODIUM 40 MG/0.4ML ~~LOC~~ SOLN
40.0000 mg | SUBCUTANEOUS | Status: DC
  Administered 2011-07-27 – 2011-08-01 (×6): 40 mg via SUBCUTANEOUS
  Filled 2011-07-27 (×7): qty 0.4

## 2011-07-27 MED ORDER — ALBUTEROL SULFATE (5 MG/ML) 0.5% IN NEBU
2.5000 mg | INHALATION_SOLUTION | Freq: Four times a day (QID) | RESPIRATORY_TRACT | Status: DC
  Administered 2011-07-27 (×2): 2.5 mg via RESPIRATORY_TRACT
  Filled 2011-07-27 (×2): qty 0.5

## 2011-07-27 MED ORDER — DILTIAZEM HCL 100 MG IV SOLR
5.0000 mg/h | INTRAVENOUS | Status: DC
  Administered 2011-07-27: 15 mg/h via INTRAVENOUS
  Administered 2011-07-27: 5 mg/h via INTRAVENOUS
  Administered 2011-07-28: 15 mg/h via INTRAVENOUS
  Filled 2011-07-27: qty 100

## 2011-07-27 MED ORDER — METOPROLOL TARTRATE 25 MG PO TABS
12.5000 mg | ORAL_TABLET | Freq: Two times a day (BID) | ORAL | Status: DC
  Administered 2011-07-27 (×2): 12.5 mg via ORAL
  Filled 2011-07-27 (×2): qty 1

## 2011-07-27 MED ORDER — LEVALBUTEROL HCL 0.63 MG/3ML IN NEBU
0.6300 mg | INHALATION_SOLUTION | Freq: Four times a day (QID) | RESPIRATORY_TRACT | Status: DC
  Administered 2011-07-27 – 2011-08-01 (×16): 0.63 mg via RESPIRATORY_TRACT
  Filled 2011-07-27 (×16): qty 3

## 2011-07-27 MED ORDER — DILTIAZEM LOAD VIA INFUSION
10.0000 mg | Freq: Once | INTRAVENOUS | Status: AC
  Administered 2011-07-27: 10 mg via INTRAVENOUS
  Filled 2011-07-27: qty 10

## 2011-07-27 MED ORDER — IOHEXOL 350 MG/ML SOLN
100.0000 mL | Freq: Once | INTRAVENOUS | Status: AC | PRN
  Administered 2011-07-27: 100 mL via INTRAVENOUS

## 2011-07-27 NOTE — Progress Notes (Signed)
Chart reviewed.  Subjective: Denies shortness of breath, cough, wheeze, chest pain or any other complaints.  Objective: Vital signs in last 24 hours: Filed Vitals:   07/27/11 0400 07/27/11 0500 07/27/11 0600 07/27/11 0817  BP: 101/58 100/53 108/59 127/60  Pulse: 77 73 75 83  Temp: 98 F (36.7 C)     TempSrc: Axillary     Resp: 17 15 15 15   Height:      Weight:      SpO2: 96% 95% 95% 93%   Weight change:   Intake/Output Summary (Last 24 hours) at 07/27/11 0858 Last data filed at 07/27/11 0600  Gross per 24 hour  Intake  592.5 ml  Output      0 ml  Net  592.5 ml   General: Asleep. Arousable. Weak appearing. Voice quiet. Will not provide much history. Lungs: Diminished throughout. Slight inspiratory and expiratory wheezes. No rhonchi or rales. Cardiovascular regular rate rhythm without murmurs gallops rubs Abdomen soft nontender nondistended Extremities sequential compression devices no clubbing cyanosis or edema  Lab Results: Basic Metabolic Panel:  Lab 07/27/11 4098 07/26/11 1920  NA 135 134*  K 5.0 4.0  CL 100 95*  CO2 31 33*  GLUCOSE 196* 165*  BUN 9 11  CREATININE 0.80 0.94  CALCIUM 7.9* 8.7  MG -- --  PHOS -- --   Liver Function Tests:  Lab 07/26/11 1920  AST 16  ALT 12  ALKPHOS 70  BILITOT 0.2*  PROT 6.9  ALBUMIN 2.9*   No results found for this basename: LIPASE:2,AMYLASE:2 in the last 168 hours No results found for this basename: AMMONIA:2 in the last 168 hours CBC:  Lab 07/26/11 1846  WBC 11.4*  NEUTROABS 8.7*  HGB 14.9  HCT 45.3  MCV 96.0  PLT 170   Cardiac Enzymes:  Lab 07/27/11 0329 07/26/11 1920  CKTOTAL 26 31  CKMB 2.2 1.9  CKMBINDEX -- --  TROPONINI <0.30 <0.30   BNP:  Lab 07/26/11 1920  PROBNP 2495.0*   D-Dimer: No results found for this basename: DDIMER:2 in the last 168 hours CBG: No results found for this basename: GLUCAP:6 in the last 168 hours Hemoglobin A1C: No results found for this basename: HGBA1C in the  last 168 hours Fasting Lipid Panel: No results found for this basename: CHOL,HDL,LDLCALC,TRIG,CHOLHDL,LDLDIRECT in the last 119 hours Thyroid Function Tests: No results found for this basename: TSH,T4TOTAL,FREET4,T3FREE,THYROIDAB in the last 168 hours Coagulation: No results found for this basename: LABPROT:4,INR:4 in the last 168 hours Anemia Panel: No results found for this basename: VITAMINB12,FOLATE,FERRITIN,TIBC,IRON,RETICCTPCT in the last 168 hours Urine Drug Screen: Drugs of Abuse  No results found for this basename: labopia, cocainscrnur, labbenz, amphetmu, thcu, labbarb    Alcohol Level: No results found for this basename: ETH:2 in the last 168 hours Urinalysis:  Lab 07/26/11 2144  COLORURINE YELLOW  LABSPEC >1.030*  PHURINE 6.0  GLUCOSEU 100*  HGBUR SMALL*  BILIRUBINUR NEGATIVE  KETONESUR NEGATIVE  PROTEINUR 100*  UROBILINOGEN 0.2  NITRITE NEGATIVE  LEUKOCYTESUR NEGATIVE   Micro Results: Recent Results (from the past 240 hour(s))  CULTURE, BLOOD (ROUTINE X 2)     Status: Normal (Preliminary result)   Collection Time   07/26/11  6:47 PM      Component Value Range Status Comment   Specimen Description BLOOD LEFT ARM   Final    Special Requests BOTTLES DRAWN AEROBIC AND ANAEROBIC 4CC EAC   Final    Culture NO GROWTH 1 DAY   Final  Report Status PENDING   Incomplete   CULTURE, BLOOD (ROUTINE X 2)     Status: Normal (Preliminary result)   Collection Time   07/26/11  6:49 PM      Component Value Range Status Comment   Specimen Description BLOOD LEFT ANTECUBITAL   Final    Special Requests BOTTLES DRAWN AEROBIC ONLY 8CC   Final    Culture NO GROWTH 1 DAY   Final    Report Status PENDING   Incomplete   MRSA PCR SCREENING     Status: Normal   Collection Time   07/27/11 12:30 AM      Component Value Range Status Comment   MRSA by PCR NEGATIVE  NEGATIVE  Final    Studies/Results: Dg Chest Portable 1 View  07/26/2011  *RADIOLOGY REPORT*  Clinical Data: Sepsis.   Weakness.  PORTABLE CHEST - 1 VIEW  Comparison: 01/17/2009  Findings: Changes of COPD again demonstrated.  Right upper lobe scarring remains stable.  No evidence of acute infiltrate or edema. No evidence of pleural effusion.  Heart size and mediastinal contours are normal.  IMPRESSION: Stable COPD.  No active disease.  Original Report Authenticated By: Danae Orleans, M.D.   Scheduled Meds:   . sodium chloride  1,000 mL Intravenous Once   Followed by  . sodium chloride  1,000 mL Intravenous Once  . acetaminophen  1,000 mg Oral Once  . albuterol  2.5 mg Nebulization QID  . albuterol  5 mg Nebulization Once  . aspirin EC  81 mg Oral Daily  . azithromycin  500 mg Intravenous Once  . azithromycin  500 mg Intravenous Q24H  . cefTRIAXone (ROCEPHIN)  IV  1 g Intravenous Q24H  . cefTRIAXone  2 g Intravenous Once  . enoxaparin  40 mg Subcutaneous Q24H  . furosemide  40 mg Oral Once  . ipratropium  0.5 mg Nebulization Once  . methylPREDNISolone (SOLU-MEDROL) injection  125 mg Intravenous Once  . metoprolol  12.5 mg Oral BID  . simvastatin  20 mg Oral Daily  . DISCONTD: metoprolol  50 mg Oral BID   Continuous Infusions:   . DISCONTD: sodium chloride 1,000 mL (07/26/11 1917)  . DISCONTD: 0.9 % NaCl with KCl 20 mEq / L 75 mL/hr at 07/27/11 0600   PRN Meds:.alprazolam, DISCONTD: albuterol Assessment/Plan: Principal Problem:  *Acute respiratory failure with hypoxia: Etiology has not entirely clear. Patient is unable or unwilling to provide much history. His pro calcitonin is negligible, making pneumonia diagnosis questionable. He did have a low-grade fever and mild leukocytosis. Also reports a cough, though he denies this to me. He is wheezing a bit and may have some bronchospasm. However, his BNP is elevated, so CHF is within the differential. His chest x-ray however shows no pulmonary edema, and he has no rales on exam. Also, given the amount of hypoxia on admission with a fairly benign chest x-ray,  I'm concerned about pulmonary embolus. I will schedule bronchodilators, continue IV antibiotics for community-acquired pneumonia coverage, try a dose of Lasix, get an echocardiogram to evaluate for heart failure, also check a d-dimer. If elevated he will need a CT angiogram to evaluate for pulmonary embolus.   Hypertension: Will decrease metoprolol to avoid hypotension until the clinical picture is more clear.   Hyperglycemia: No reported history of diabetes. Will check hemoglobin A1c and monitor blood glucoses.   Elevated brain natriuretic peptide (BNP) level: see above   Anxiety: Resume Xanax at lower dose when necessary. Watch for withdrawal  History of colon cancer   LOS: 1 day   Veeda Virgo L 07/27/2011, 8:58 AM

## 2011-07-27 NOTE — Plan of Care (Signed)
Problem: ICU Phase Progression Outcomes Goal: O2 sats trending toward baseline Outcome: Progressing sats are staying at 91-92%

## 2011-07-27 NOTE — Plan of Care (Signed)
Problem: ICU Phase Progression Outcomes Goal: Voiding-avoid urinary catheter unless indicated Outcome: Completed/Met Date Met:  07/27/11 At this time patient does not have a foley catheter.

## 2011-07-27 NOTE — Progress Notes (Signed)
Pt's heart rate elevated into the 120-130's. Pt has no symptoms of chest pain or SOB. Pt sitting up eating dinner. EKG recorded Afib with RVR. MD notified.

## 2011-07-27 NOTE — Progress Notes (Signed)
Developed A fib with RVR. Start cardizem gtt. CTA negative for PE. Await echo. Check TSH. Change albuterol to xopenex

## 2011-07-28 ENCOUNTER — Encounter (HOSPITAL_COMMUNITY): Payer: Self-pay | Admitting: Internal Medicine

## 2011-07-28 DIAGNOSIS — I4891 Unspecified atrial fibrillation: Secondary | ICD-10-CM

## 2011-07-28 DIAGNOSIS — J159 Unspecified bacterial pneumonia: Secondary | ICD-10-CM

## 2011-07-28 DIAGNOSIS — J209 Acute bronchitis, unspecified: Secondary | ICD-10-CM

## 2011-07-28 DIAGNOSIS — C3410 Malignant neoplasm of upper lobe, unspecified bronchus or lung: Secondary | ICD-10-CM | POA: Diagnosis present

## 2011-07-28 DIAGNOSIS — R918 Other nonspecific abnormal finding of lung field: Secondary | ICD-10-CM

## 2011-07-28 DIAGNOSIS — J439 Emphysema, unspecified: Secondary | ICD-10-CM

## 2011-07-28 DIAGNOSIS — J9600 Acute respiratory failure, unspecified whether with hypoxia or hypercapnia: Secondary | ICD-10-CM

## 2011-07-28 DIAGNOSIS — J984 Other disorders of lung: Secondary | ICD-10-CM | POA: Diagnosis present

## 2011-07-28 HISTORY — DX: Emphysema, unspecified: J43.9

## 2011-07-28 HISTORY — DX: Unspecified atrial fibrillation: I48.91

## 2011-07-28 HISTORY — DX: Other disorders of lung: J98.4

## 2011-07-28 LAB — GLUCOSE, CAPILLARY
Glucose-Capillary: 124 mg/dL — ABNORMAL HIGH (ref 70–99)
Glucose-Capillary: 110 mg/dL — ABNORMAL HIGH (ref 70–99)
Glucose-Capillary: 106 mg/dL — ABNORMAL HIGH (ref 70–99)
Glucose-Capillary: 110 mg/dL — ABNORMAL HIGH (ref 70–99)

## 2011-07-28 LAB — LEGIONELLA ANTIGEN, URINE

## 2011-07-28 LAB — BASIC METABOLIC PANEL
BUN: 12 mg/dL (ref 6–23)
CO2: 33 mEq/L — ABNORMAL HIGH (ref 19–32)
GFR calc non Af Amer: 87 mL/min — ABNORMAL LOW (ref >90)
Glucose, Bld: 141 mg/dL — ABNORMAL HIGH (ref 70–99)
Potassium: 3.9 mEq/L (ref 3.5–5.1)

## 2011-07-28 LAB — CBC
HCT: 39.4 % (ref 39.0–52.0)
Hemoglobin: 12.7 g/dL — ABNORMAL LOW (ref 13.0–17.0)
MCHC: 32.2 g/dL (ref 30.0–36.0)
RBC: 4.1 MIL/uL — ABNORMAL LOW (ref 4.22–5.81)

## 2011-07-28 LAB — URINE CULTURE
Culture  Setup Time: 201305261944
Culture: NO GROWTH

## 2011-07-28 MED ORDER — PIPERACILLIN-TAZOBACTAM 3.375 G IVPB
3.3750 g | Freq: Three times a day (TID) | INTRAVENOUS | Status: DC
  Administered 2011-07-28 – 2011-08-01 (×12): 3.375 g via INTRAVENOUS
  Filled 2011-07-28 (×22): qty 50

## 2011-07-28 MED ORDER — TUBERCULIN PPD 5 UNIT/0.1ML ID SOLN
5.0000 [IU] | Freq: Once | INTRADERMAL | Status: AC
  Administered 2011-07-28: 5 [IU] via INTRADERMAL
  Filled 2011-07-28: qty 0.1

## 2011-07-28 MED ORDER — POTASSIUM CHLORIDE IN NACL 20-0.9 MEQ/L-% IV SOLN: INTRAVENOUS | Status: DC

## 2011-07-28 MED ORDER — DILTIAZEM HCL ER COATED BEADS 180 MG PO CP24
180.0000 mg | ORAL_CAPSULE | Freq: Every day | ORAL | Status: DC
  Administered 2011-07-28: 180 mg via ORAL
  Filled 2011-07-28: qty 1

## 2011-07-28 MED ORDER — ALPRAZOLAM 1 MG PO TABS
2.0000 mg | ORAL_TABLET | Freq: Four times a day (QID) | ORAL | Status: DC | PRN
  Administered 2011-07-28 – 2011-08-01 (×8): 2 mg via ORAL
  Filled 2011-07-28 (×3): qty 2
  Filled 2011-07-28: qty 4
  Filled 2011-07-28 (×3): qty 2
  Filled 2011-07-28: qty 4
  Filled 2011-07-28: qty 2

## 2011-07-28 MED ORDER — ATORVASTATIN CALCIUM 10 MG PO TABS
10.0000 mg | ORAL_TABLET | Freq: Every day | ORAL | Status: DC
  Administered 2011-07-29 – 2011-07-31 (×3): 10 mg via ORAL
  Filled 2011-07-28 (×3): qty 1

## 2011-07-28 NOTE — Progress Notes (Signed)
PHARMACIST - PHYSICIAN COMMUNICATION DR:   Lendell Caprice CONCERNING:  Drug interaction with Cardizem and Zocor  FDA recommendation is for a maximum dose of Zocor 10mg  in combination with Diltiazem d/t increased risk for rhabdomyalgias   RECOMMENDATION: Have changed Zocor 20mg  to Lipitor 10mg  while patient is in hospital per hospital policy. Consider alternate agent at discharge if patient continues on Cardizem.     Junita Push, PharmD, BCPS 07/28/2011@3 :48 PM

## 2011-07-28 NOTE — Progress Notes (Signed)
ANTIBIOTIC CONSULT NOTE - INITIAL  Pharmacy Consult for Zosyn Indication: pneumonia  Allergies  Allergen Reactions  . Celebrex (Celecoxib)   . Tetracyclines & Related Hives    Patient Measurements: Height: 6\' 1"  (185.4 cm) Weight: 145 lb 15.1 oz (66.2 kg) IBW/kg (Calculated) : 79.9   Vital Signs: Temp: 98.4 F (36.9 C) (05/27 0400) Temp src: Oral (05/27 0400) BP: 122/67 mmHg (05/27 0600) Pulse Rate: 85  (05/27 0600) Intake/Output from previous day: 05/26 0701 - 05/27 0700 In: 1487.4 [P.O.:1140; I.V.:297.4; IV Piggyback:50] Out: 1200 [Urine:1200] Intake/Output from this shift:    Labs:  Basename 07/28/11 0414 07/27/11 0330 07/26/11 1920 07/26/11 1846  WBC 11.3* -- -- 11.4*  HGB 12.7* -- -- 14.9  PLT 198 -- -- 170  LABCREA -- -- -- --  CREATININE 0.80 0.80 0.94 --   Estimated Creatinine Clearance: 77 ml/min (by C-G formula based on Cr of 0.8). No results found for this basename: VANCOTROUGH:2,VANCOPEAK:2,VANCORANDOM:2,GENTTROUGH:2,GENTPEAK:2,GENTRANDOM:2,TOBRATROUGH:2,TOBRAPEAK:2,TOBRARND:2,AMIKACINPEAK:2,AMIKACINTROU:2,AMIKACIN:2, in the last 72 hours   Microbiology: Recent Results (from the past 720 hour(s))  CULTURE, BLOOD (ROUTINE X 2)     Status: Normal (Preliminary result)   Collection Time   07/26/11  6:47 PM      Component Value Range Status Comment   Specimen Description BLOOD LEFT ARM   Final    Special Requests BOTTLES DRAWN AEROBIC AND ANAEROBIC 4CC EAC   Final    Culture NO GROWTH 1 DAY   Final    Report Status PENDING   Incomplete   CULTURE, BLOOD (ROUTINE X 2)     Status: Normal (Preliminary result)   Collection Time   07/26/11  6:49 PM      Component Value Range Status Comment   Specimen Description BLOOD LEFT ANTECUBITAL   Final    Special Requests BOTTLES DRAWN AEROBIC ONLY 8CC   Final    Culture NO GROWTH 1 DAY   Final    Report Status PENDING   Incomplete   MRSA PCR SCREENING     Status: Normal   Collection Time   07/27/11 12:30 AM   Component Value Range Status Comment   MRSA by PCR NEGATIVE  NEGATIVE  Final   CULTURE, RESPIRATORY     Status: Normal (Preliminary result)   Collection Time   07/27/11 12:30 AM      Component Value Range Status Comment   Specimen Description SPU   Final    Special Requests NONE   Final    Gram Stain PENDING   Incomplete    Culture Culture reincubated for better growth   Final    Report Status PENDING   Incomplete     Medical History: Past Medical History  Diagnosis Date  . Hypertension   . High cholesterol   . Anxiety   . Cancer 1993    colon  . Rapid atrial fibrillation 07/28/2011    Medications:  Scheduled:    . aspirin EC  81 mg Oral Daily  . azithromycin  500 mg Intravenous Q24H  . diltiazem  180 mg Oral Daily  . diltiazem  10 mg Intravenous Once  . enoxaparin  40 mg Subcutaneous Q24H  . furosemide  40 mg Oral Once  . levalbuterol  0.63 mg Nebulization Q6H  . piperacillin-tazobactam (ZOSYN)  IV  3.375 g Intravenous Q8H  . simvastatin  20 mg Oral Daily  . sodium chloride      . tuberculin  5 Units Intradermal Once  . DISCONTD: albuterol  2.5 mg Nebulization QID  .  DISCONTD: cefTRIAXone (ROCEPHIN)  IV  1 g Intravenous Q24H  . DISCONTD: metoprolol  50 mg Oral BID  . DISCONTD: metoprolol  12.5 mg Oral BID   Assessment: 76 yo M admitted for CAP on 5/25 and started on Rocephin and Zithromax.  Now concerned for possible aspiration PNA or tuberculosis.  Changing Rocephin to Zosyn for broader coverage.   Pt remains afebrile with normal renal function. Cx data is pending.  Goal of Therapy:  Eradicate infection.  Plan:  1) Zosyn 3.375gm IV Q8h to be infused over 4hrs. 2) Monitor renal function and cx data. 3) Continue Zithromax 500mg  IV Q24h.  Elson Clan 07/28/2011,8:41 AM
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
## 2011-07-28 NOTE — Progress Notes (Signed)
TB skin test placed in RIGHT arm at 1100, area circled with black marker. Needs to be read Wednesday.

## 2011-07-28 NOTE — Consult Note (Signed)
Consult requested by: Dr. Sherrie Mustache Consult requested for abnormal chest CT:  HPI: This is a 76 year old who apparently been in fairly good health but developed increasing cough and shortness of breath. He says he feels okay. He does say that he's lost some weight which she blames on the fact that he worries about his son. He says he's not been eating well. It is not clear these had any fever or chills. He has been coughing up some sputum. He has a history of exposure to tuberculosis many years ago when he was living in [facility]  Past Medical History  Diagnosis Date  . Hypertension   . High cholesterol   . Anxiety   . Cancer 1993    colon     Family History  Problem Relation Age of Onset  . Heart disease Mother   . Stroke Father   . Pneumonia Sister   . Heart disease Brother      History   Social History  . Marital Status: Married    Spouse Name: N/A    Number of Children: N/A  . Years of Education: N/A   Social History Main Topics  . Smoking status: Current Everyday Smoker -- 0.5 packs/day  . Smokeless tobacco: None  . Alcohol Use: No  . Drug Use: No  . Sexually Active:    Other Topics Concern  . None   Social History Narrative  . None     ROS: He does say he's lost some weight. His cough up some sputum. He has not had night sweats definite fever or chills or hemoptysis    Objective: Vital signs in last 24 hours: Temp:  [97.7 F (36.5 C)-98.4 F (36.9 C)] 98.4 F (36.9 C) (05/27 0400) Pulse Rate:  [68-132] 85  (05/27 0600) Resp:  [13-22] 19  (05/27 0600) BP: (76-167)/(44-87) 122/67 mmHg (05/27 0600) SpO2:  [91 %-100 %] 94 % (05/27 0718) Weight:  [66.2 kg (145 lb 15.1 oz)] 66.2 kg (145 lb 15.1 oz) (05/27 0500) Weight change: -10.912 kg (-24 lb 0.9 oz) Last BM Date: 07/26/11  Intake/Output from previous day: 05/26 0701 - 05/27 0700 In: 1487.4 [P.O.:1140; I.V.:297.4; IV Piggyback:50] Out: 1200 [Urine:1200]  PHYSICAL EXAM He is a thin  somewhat chronically ill-appearing male in no acute distress. His pupils are reactive. His nose and throat clear. His neck is supple without masses. His chest is relatively clear with some rhonchi more on the right than on the left. His heart is somewhat irregular. There is no gallop. His abdomen is soft with no masses. Extremities showed no clubbing cyanosis or edema. Central nervous system examination is grossly intact. I do not feel any lymphadenopathy  Lab Results: Basic Metabolic Panel:  Basename 07/28/11 0414 07/27/11 0330  NA 137 135  K 3.9 5.0  CL 98 100  CO2 33* 31  GLUCOSE 141* 196*  BUN 12 9  CREATININE 0.80 0.80  CALCIUM 8.5 7.9*  MG -- --  PHOS -- --   Liver Function Tests:  [facility] 07/26/11 1920  AST 16  ALT 12  ALKPHOS 70  BILITOT 0.2*  PROT 6.9  ALBUMIN 2.9*   No results found for this basename: LIPASE:2,AMYLASE:2 in the last 72 hours No results found for this basename: AMMONIA:2 in the last 72 hours CBC:  Basename 07/28/11 0414 07/26/11 1846  WBC 11.3* 11.4*  NEUTROABS -- 8.7*  HGB 12.7* 14.9  HCT 39.4 45.3  MCV 96.1 96.0  PLT 198 170   Cardiac  Enzymes:  Basename 07/27/11 0329 07/26/11 1920  CKTOTAL 26 31  CKMB 2.2 1.9  CKMBINDEX -- --  TROPONINI <0.30 <0.30   BNP:  Basename 07/26/11 1920  PROBNP 2495.0*   D-Dimer:  Alvira Philips 07/27/11 0900  DDIMER 2.50*   CBG:  Basename 07/27/11 1644  GLUCAP 146*   Hemoglobin A1C:  Basename 07/27/11 0900  HGBA1C 6.0*   Fasting Lipid Panel: No results found for this basename: CHOL,HDL,LDLCALC,TRIG,CHOLHDL,LDLDIRECT in the last 72 hours Thyroid Function Tests: No results found for this basename: TSH,T4TOTAL,FREET4,T3FREE,THYROIDAB in the last 72 hours Anemia Panel: No results found for this basename: VITAMINB12,FOLATE,FERRITIN,TIBC,IRON,RETICCTPCT in the last 72 hours Coagulation: No results found for this basename: LABPROT:2,INR:2 in the last 72 hours Urine Drug Screen: Drugs of Abuse  No  results found for this basename: labopia, cocainscrnur, labbenz, amphetmu, thcu, labbarb    Alcohol Level: No results found for this basename: ETH:2 in the last 72 hours Urinalysis:  Basename 07/26/11 2144  COLORURINE YELLOW  LABSPEC >1.030*  PHURINE 6.0  GLUCOSEU 100*  HGBUR SMALL*  BILIRUBINUR NEGATIVE  KETONESUR NEGATIVE  PROTEINUR 100*  UROBILINOGEN 0.2  NITRITE NEGATIVE  LEUKOCYTESUR NEGATIVE   Misc. Lab   ABGS: No results found for this basename: PHART,PCO2,PO2ART,TCO2,HCO3 in the last 72 hours   MICROBIOLOGY: Recent Results (from the past 240 hour(s))  CULTURE, BLOOD (ROUTINE X 2)     Status: Normal (Preliminary result)   Collection Time   07/26/11  6:47 PM      Component Value Range Status Comment   Specimen Description BLOOD LEFT ARM   Final    Special Requests BOTTLES DRAWN AEROBIC AND ANAEROBIC 4CC EAC   Final    Culture NO GROWTH 1 DAY   Final    Report Status PENDING   Incomplete   CULTURE, BLOOD (ROUTINE X 2)     Status: Normal (Preliminary result)   Collection Time   07/26/11  6:49 PM      Component Value Range Status Comment   Specimen Description BLOOD LEFT ANTECUBITAL   Final    Special Requests BOTTLES DRAWN AEROBIC ONLY 8CC   Final    Culture NO GROWTH 1 DAY   Final    Report Status PENDING   Incomplete   MRSA PCR SCREENING     Status: Normal   Collection Time   07/27/11 12:30 AM      Component Value Range Status Comment   MRSA by PCR NEGATIVE  NEGATIVE  Final     Studies/Results: Ct Angio Chest W/cm &/or Wo Cm  07/27/2011  *RADIOLOGY REPORT*  Clinical Data: 76 year old with hypoxia and elevated D-dimer.  CT ANGIOGRAPHY CHEST  Technique:  Multidetector CT imaging of the chest using the standard protocol during bolus administration of intravenous contrast. Multiplanar reconstructed images including MIPs were obtained and reviewed to evaluate the vascular anatomy.  Contrast: OMNIPAQUE IOHEXOL 350 MG/ML SOLN  Comparison: Chest radiograph  07/27/2011  Findings: There is no evidence for pulmonary embolism.  There is edema in the upper abdomen, particularly near the kidneys.  There is diffuse atherosclerotic disease involving the thoracic aorta but no evidence for a dissection.  There is a small amount of pleural thickening or fluid bilaterally.  Prominent prevascular lymph node measuring 0.9 cm on sequence #4, image 39.  Precarinal lymph node measures 0.9 cm in the short axis.  Subcarinal lymph node measures 1.5 cm on image 47.  The trachea and mainstem bronchi are patent. There is diffuse peribronchial thickening but this is  most prominent in the right lower lobe and right middle lobe.  In fact, there is narrowing of the right lower lobe bronchus.  Diffuse interstitial thickening and parenchymal nodularity in the right lower lobe.  Subtle small nodular densities in the lower right upper lobe.  Emphysematous changes with irregular cavitary or bullous lesions in the right upper lobe.  Questionable 7 mm nodule in the right upper lobe on sequence five, image 35.  There appears to be scarring at the left lung apex.  There are pleural-based densities along the dependent aspect of the left lower lobe which is indeterminate but could be related to atelectasis.  Mild peripheral nodularity in the left lower lobe.  No acute bony abnormality.  IMPRESSION: Negative for acute pulmonary embolism.  Diffuse peribronchial thickening, particularly in the right lower lobe where there is narrowing of the right lower lobe bronchus. There is diffuse interstitial and nodular disease in the right lower lung.  Findings are suspicious for an infectious etiology. There are similar more subtle findings in the right lower lobe and left lower lobe.  There are scattered irregular opacities with bulla/cavitary formations throughout the right upper lung.  There is mild mediastinal lymphadenopathy, particularly in the subcarinal station.  Recommend follow-up Chest CT within 2-3 months  to evaluate the irregular opacities and possible cavitary lesions in the right upper lung.  In addition, the chest lymphadenopathy should be followed.  Original Report Authenticated By: Richarda Overlie, M.D.   Dg Chest Port 1 View  07/27/2011  *RADIOLOGY REPORT*  Clinical Data: Evaluate pneumonia.  PORTABLE CHEST - 1 VIEW  Comparison: 07/26/2011 01/17/2009  Findings: There is slight respiratory motion at the lung bases. Underlying changes of emphysema are noted.  Probable stable scarring laterally in the right upper lobe, similar dating back to 2010.  There is a developing faint opacity at the right lung base, suspicious for pneumonia.  The left lung is clear.  No visible pneumothorax or pleural effusion.  Postsurgical changes of the lower cervical spine.  No acute bony abnormality.  IMPRESSION: Developing right basilar opacity suspicious for pneumonia. Emphysema.  Original Report Authenticated By: Britta Mccreedy, M.D.   Dg Chest Portable 1 View  07/26/2011  *RADIOLOGY REPORT*  Clinical Data: Sepsis.  Weakness.  PORTABLE CHEST - 1 VIEW  Comparison: 01/17/2009  Findings: Changes of COPD again demonstrated.  Right upper lobe scarring remains stable.  No evidence of acute infiltrate or edema. No evidence of pleural effusion.  Heart size and mediastinal contours are normal.  IMPRESSION: Stable COPD.  No active disease.  Original Report Authenticated By: Danae Orleans, M.D.    Medications:  Scheduled:   . aspirin EC  81 mg Oral Daily  . azithromycin  500 mg Intravenous Q24H  . diltiazem  180 mg Oral Daily  . diltiazem  10 mg Intravenous Once  . enoxaparin  40 mg Subcutaneous Q24H  . furosemide  40 mg Oral Once  . levalbuterol  0.63 mg Nebulization Q6H  . piperacillin-tazobactam (ZOSYN)  IV  3.375 g Intravenous Q8H  . simvastatin  20 mg Oral Daily  . sodium chloride      . tuberculin  5 Units Intradermal Once  . DISCONTD: albuterol  2.5 mg Nebulization QID  . DISCONTD: cefTRIAXone (ROCEPHIN)  IV  1 g  Intravenous Q24H  . DISCONTD: metoprolol  50 mg Oral BID  . DISCONTD: metoprolol  12.5 mg Oral BID   Continuous:   . diltiazem (CARDIZEM) infusion 15 mg/hr (07/28/11 0600)  .  DISCONTD: 0.9 % NaCl with KCl 20 mEq / L 75 mL/hr at 07/27/11 0600   ZOX:WRUEAVWUJW, iohexol, DISCONTD: albuterol  Assesment: He has a number of abnormalities on his CT that I think are related probably to aspiration. He does have a history of TB exposure. Principal Problem:  *Acute respiratory failure with hypoxia Active Problems:  CAP (community acquired pneumonia)  Hypertension  Anxiety  Hyperglycemia  Elevated brain natriuretic peptide (BNP) level  Acute bronchitis  Lung nodules  Bulla of lung  Cavitary lesion of lung    Plan: I agree with Dr. Theodis Aguas plans. He's going to be switched to a negative pressure room. He he's going to be on more anaerobic coverage.    LOS: 2 days   Lorena Benham L 07/28/2011, 8:25 AM

## 2011-07-28 NOTE — Evaluation (Signed)
Physical Therapy Evaluation Patient Details Name: Adam Ward MRN: [id number] DOB: [birth year] Today's Date: 07/28/2011 Time: 1015-1040 PT Time Calculation (min): 25 min  PT Assessment / Plan / Recommendation Clinical Impression  Pt with declining physcial ability per patient's daughter who will benefit from skilled PT to address issues of decreased mobility and improve I and safety.    PT Assessment  Patient needs continued PT services    Follow Up Recommendations  Home health PT    Barriers to Discharge None      lEquipment Recommendations  Rolling walker with 5" wheels    Recommendations for Other Services     Frequency Min 3X/week    Precautions / Restrictions Precautions Precautions: Fall Restrictions Weight Bearing Restrictions: No         Mobility  Bed Mobility Bed Mobility: Not assessed (Pt states he has been up all day and wants to wait )    Exercises General Exercises - Lower Extremity Ankle Circles/Pumps: AROM;Strengthening;10 reps;Both;Supine (therapist gave manual resistance) Quad Sets: Strengthening;10 reps Gluteal Sets: Strengthening;10 reps Hip ABduction/ADduction: Strengthening;Supine;10 reps Straight Leg Raises: Strengthening;10 reps;Supine   PT Diagnosis: Difficulty walking;Generalized weakness  PT Problem List: Decreased strength;Decreased activity tolerance;Decreased mobility PT Treatment Interventions: DME instruction;Gait training;Therapeutic exercise   PT Goals Acute Rehab PT Goals PT Goal Formulation: With patient/family Time For Goal Achievement: 08/04/11 Potential to Achieve Goals: Good Pt will go Sit to Supine/Side: with modified independence PT Goal: Sit to Supine/Side - Progress: Goal set today Pt will go Sit to Stand: with modified independence PT Goal: Sit to Stand - Progress: Goal set today Pt will Ambulate: 51 - 150 feet;with modified independence;with rolling walker PT Goal: Ambulate - Progress: Goal set today Pt will Go Up  / Down Stairs: 6-9 stairs;with least restrictive assistive device;with rail(s) (possibility of cane and handrail to increase safety of stair) PT Goal: Up/Down Stairs - Progress: Goal set today  Visit Information  Last PT Received On: 07/28/11 Reason Eval/Treat Not Completed: Patient refused    Subjective Data  Subjective: Pt states he walks at home without a walker.   (Daughter told therapist that he is depressed only walks from his bedroom upstairs,needing assistance with stairs, to his couch and stays on the couch all day.  States steps are small and shuffling). Patient Stated Goal: Go home   Prior Functioning  Home Living Lives With: Spouse;Son (son in MVA that left him a quadriplegic) Available Help at Discharge: Family Type of Home: House Home Access: Stairs to enter Secretary/administrator of Steps: 3 Entrance Stairs-Rails: Right Home Layout: Two level Alternate Level Stairs-Number of Steps: 8 Alternate Level Stairs-Rails: Right Bathroom Shower/Tub: Nurse, adult Accessibility: Yes Home Adaptive Equipment: None Prior Function Level of Independence: Needs assistance Needs Assistance: Gait Gait Assistance: wife assissts Pt downstairs with hand held assist. Able to Take Stairs?: No Driving: No Vocation: Retired Musician: No difficulties    Cognition  Overall Cognitive Status: Appears within functional limits for tasks assessed/performed Arousal/Alertness: Awake/alert Orientation Level: Appears intact for tasks assessed Behavior During Session: [facility] for tasks performed    Extremity/Trunk Assessment     Balance    End of Session PT - End of Session Activity Tolerance: Treatment limited secondary to agitation Patient left: in bed;with call bell/phone within reach;with family/visitor present   Dustin Bumbaugh,CINDY 07/28/2011, 10:49 AM

## 2011-07-29 DIAGNOSIS — J209 Acute bronchitis, unspecified: Secondary | ICD-10-CM

## 2011-07-29 DIAGNOSIS — R918 Other nonspecific abnormal finding of lung field: Secondary | ICD-10-CM

## 2011-07-29 DIAGNOSIS — J159 Unspecified bacterial pneumonia: Secondary | ICD-10-CM

## 2011-07-29 DIAGNOSIS — I4891 Unspecified atrial fibrillation: Secondary | ICD-10-CM

## 2011-07-29 LAB — CULTURE, RESPIRATORY W GRAM STAIN
Culture: NORMAL
Gram Stain: NONE SEEN

## 2011-07-29 LAB — GLUCOSE, CAPILLARY
Glucose-Capillary: 119 mg/dL — ABNORMAL HIGH (ref 70–99)
Glucose-Capillary: 122 mg/dL — ABNORMAL HIGH (ref 70–99)
Glucose-Capillary: 115 mg/dL — ABNORMAL HIGH (ref 70–99)
Glucose-Capillary: 121 mg/dL — ABNORMAL HIGH (ref 70–99)

## 2011-07-29 MED ORDER — AZITHROMYCIN 250 MG PO TABS
500.0000 mg | ORAL_TABLET | Freq: Every day | ORAL | Status: DC
  Administered 2011-07-29 – 2011-07-31 (×3): 500 mg via ORAL
  Filled 2011-07-29 (×3): qty 2

## 2011-07-29 MED ORDER — GUAIFENESIN ER 600 MG PO TB12
600.0000 mg | ORAL_TABLET | Freq: Two times a day (BID) | ORAL | Status: DC
  Administered 2011-07-29 – 2011-08-01 (×7): 600 mg via ORAL
  Filled 2011-07-29 (×3): qty 1
  Filled 2011-07-29: qty 2
  Filled 2011-07-29 (×3): qty 1

## 2011-07-29 MED ORDER — DIGOXIN 125 MCG PO TABS
0.1250 mg | ORAL_TABLET | Freq: Every day | ORAL | Status: DC
  Administered 2011-07-29 – 2011-07-30 (×2): 0.125 mg via ORAL
  Filled 2011-07-29 (×2): qty 1

## 2011-07-29 MED ORDER — DILTIAZEM HCL ER COATED BEADS 240 MG PO CP24
240.0000 mg | ORAL_CAPSULE | Freq: Every day | ORAL | Status: DC
  Administered 2011-07-29 – 2011-08-01 (×4): 240 mg via ORAL
  Filled 2011-07-29 (×4): qty 1

## 2011-07-29 NOTE — Progress Notes (Signed)
*  PRELIMINARY RESULTS* Echocardiogram 2D Echocardiogram has been performed.  Caswell Corwin 07/29/2011, 11:37 AM

## 2011-07-29 NOTE — Progress Notes (Signed)
An attempt was made to work with pt, but he declined.  He states that the ICU lines and wires are upsetting to him, especially when getting OOB.  I reminded him that he needed to maintain his strength in order to be able to go home, but he had some friends visiting and still declined.  Will try again tomorrow.

## 2011-07-29 NOTE — Progress Notes (Signed)
Subjective: The patient says he slept better last night. He feels as if he is breathing a little better this morning. He still has a productive cough with tan color sputum.   Objective: Vital signs in last 24 hours: Filed Vitals:   07/29/11 0300 07/29/11 0400 07/29/11 0500 07/29/11 0600  BP: 142/78 163/125 112/68 133/85  Pulse: 118 144 102 122  Temp:  97.9 F (36.6 C)    TempSrc:  Oral    Resp: 22 27 21 31   Height:      Weight:   67.8 kg (149 lb 7.6 oz)   SpO2: 93% 90% 95% 94%    Intake/Output Summary (Last 24 hours) at 07/29/11 0757 Last data filed at 07/29/11 0600  Gross per 24 hour  Intake 2100.33 ml  Output   1325 ml  Net 775.33 ml    Weight change: 1.6 kg (3 lb 8.4 oz)  Physical exam: General: Pleasant alert 76 year old Caucasian man, lying in bed in no acute distress. Lungs: Rare crackles noted on the right and mild expiratory wheezes. Breathing is nonlabored. Heart: Irregular, irregular, with tachycardia. Abdomen: Positive bowel sounds, soft, nontender, nondistended. Extremities: No pedal edema. Neurologic/psychiatric: He has a flat affect. He is alert and oriented x2. Cranial nerves II through XII are grossly intact.  Lab Results: Basic Metabolic Panel:  Basename 07/28/11 0414 07/27/11 0330  NA 137 135  K 3.9 5.0  CL 98 100  CO2 33* 31  GLUCOSE 141* 196*  BUN 12 9  CREATININE 0.80 0.80  CALCIUM 8.5 7.9*  MG -- --  PHOS -- --   Liver Function Tests:  [facility] 07/26/11 1920  AST 16  ALT 12  ALKPHOS 70  BILITOT 0.2*  PROT 6.9  ALBUMIN 2.9*   No results found for this basename: LIPASE:2,AMYLASE:2 in the last 72 hours No results found for this basename: AMMONIA:2 in the last 72 hours CBC:  Basename 07/28/11 0414 07/26/11 1846  WBC 11.3* 11.4*  NEUTROABS -- 8.7*  HGB 12.7* 14.9  HCT 39.4 45.3  MCV 96.1 96.0  PLT 198 170   Cardiac Enzymes:  Basename 07/27/11 0329 07/26/11 1920  CKTOTAL 26 31  CKMB 2.2 1.9  CKMBINDEX -- --  TROPONINI  <0.30 <0.30   BNP:  Basename 07/26/11 1920  PROBNP 2495.0*   D-Dimer:  Basename 07/27/11 0900  DDIMER 2.50*   CBG:  Basename 07/28/11 2124 07/28/11 1703 07/28/11 1201 07/28/11 0742 07/27/11 1644  GLUCAP 110* 106* 110* 124* 146*   Hemoglobin A1C:  Basename 07/27/11 0900  HGBA1C 6.0*   Fasting Lipid Panel: No results found for this basename: CHOL,HDL,LDLCALC,TRIG,CHOLHDL,LDLDIRECT in the last 72 hours Thyroid Function Tests:  Basename 07/28/11 0414  TSH 1.631  T4TOTAL --  Domingo Cocking --  T3FREE --  THYROIDAB --   Anemia Panel: No results found for this basename: VITAMINB12,FOLATE,FERRITIN,TIBC,IRON,RETICCTPCT in the last 72 hours Coagulation: No results found for this basename: LABPROT:2,INR:2 in the last 72 hours Urine Drug Screen: Drugs of Abuse  No results found for this basename: labopia,  cocainscrnur,  labbenz,  amphetmu,  thcu,  labbarb    Alcohol Level: No results found for this basename: ETH:2 in the last 72 hours Urinalysis:  Basename 07/26/11 2144  COLORURINE YELLOW  LABSPEC >1.030*  PHURINE 6.0  GLUCOSEU 100*  HGBUR SMALL*  BILIRUBINUR NEGATIVE  KETONESUR NEGATIVE  PROTEINUR 100*  UROBILINOGEN 0.2  NITRITE NEGATIVE  LEUKOCYTESUR NEGATIVE   Misc. Labs:   Micro: Recent Results (from the past 240 hour(s))  CULTURE,  BLOOD (ROUTINE X 2)     Status: Normal (Preliminary result)   Collection Time   07/26/11  6:47 PM      Component Value Range Status Comment   Specimen Description BLOOD LEFT ARM   Final    Special Requests BOTTLES DRAWN AEROBIC AND ANAEROBIC 4CC EAC   Final    Culture NO GROWTH 2 DAYS   Final    Report Status PENDING   Incomplete   CULTURE, BLOOD (ROUTINE X 2)     Status: Normal (Preliminary result)   Collection Time   07/26/11  6:49 PM      Component Value Range Status Comment   Specimen Description BLOOD LEFT ANTECUBITAL   Final    Special Requests BOTTLES DRAWN AEROBIC ONLY 8CC   Final    Culture NO GROWTH 2 DAYS   Final     Report Status PENDING   Incomplete   URINE CULTURE     Status: Normal   Collection Time   07/26/11  9:44 PM      Component Value Range Status Comment   Specimen Description URINE, CATHETERIZED   Final    Special Requests NONE   Final    Culture  Setup Time [id number]   Final    Colony Count NO GROWTH   Final    Culture NO GROWTH   Final    Report Status 07/28/2011 FINAL   Final   MRSA PCR SCREENING     Status: Normal   Collection Time   07/27/11 12:30 AM      Component Value Range Status Comment   MRSA by PCR NEGATIVE  NEGATIVE  Final   CULTURE, RESPIRATORY     Status: Normal (Preliminary result)   Collection Time   07/27/11 12:30 AM      Component Value Range Status Comment   Specimen Description SPU   Final    Special Requests NONE   Final    Gram Stain     Final    Value: NO WBC SEEN     FEW SQUAMOUS EPITHELIAL CELLS PRESENT     RARE GRAM POSITIVE COCCI IN PAIRS     RARE GRAM POSITIVE RODS     RARE GRAM NEGATIVE RODS   Culture Culture reincubated for better growth   Final    Report Status PENDING   Incomplete     Studies/Results: Ct Angio Chest W/cm &/or Wo Cm  07/27/2011  *RADIOLOGY REPORT*  Clinical Data: 76 year old with hypoxia and elevated D-dimer.  CT ANGIOGRAPHY CHEST  Technique:  Multidetector CT imaging of the chest using the standard protocol during bolus administration of intravenous contrast. Multiplanar reconstructed images including MIPs were obtained and reviewed to evaluate the vascular anatomy.  Contrast: OMNIPAQUE IOHEXOL 350 MG/ML SOLN  Comparison: Chest radiograph 07/27/2011  Findings: There is no evidence for pulmonary embolism.  There is edema in the upper abdomen, particularly near the kidneys.  There is diffuse atherosclerotic disease involving the thoracic aorta but no evidence for a dissection.  There is a small amount of pleural thickening or fluid bilaterally.  Prominent prevascular lymph node measuring 0.9 cm on sequence #4, image 39.  Precarinal  lymph node measures 0.9 cm in the short axis.  Subcarinal lymph node measures 1.5 cm on image 47.  The trachea and mainstem bronchi are patent. There is diffuse peribronchial thickening but this is most prominent in the right lower lobe and right middle lobe.  In fact, there is narrowing of the right  lower lobe bronchus.  Diffuse interstitial thickening and parenchymal nodularity in the right lower lobe.  Subtle small nodular densities in the lower right upper lobe.  Emphysematous changes with irregular cavitary or bullous lesions in the right upper lobe.  Questionable 7 mm nodule in the right upper lobe on sequence five, image 35.  There appears to be scarring at the left lung apex.  There are pleural-based densities along the dependent aspect of the left lower lobe which is indeterminate but could be related to atelectasis.  Mild peripheral nodularity in the left lower lobe.  No acute bony abnormality.  IMPRESSION: Negative for acute pulmonary embolism.  Diffuse peribronchial thickening, particularly in the right lower lobe where there is narrowing of the right lower lobe bronchus. There is diffuse interstitial and nodular disease in the right lower lung.  Findings are suspicious for an infectious etiology. There are similar more subtle findings in the right lower lobe and left lower lobe.  There are scattered irregular opacities with bulla/cavitary formations throughout the right upper lung.  There is mild mediastinal lymphadenopathy, particularly in the subcarinal station.  Recommend follow-up Chest CT within 2-3 months to evaluate the irregular opacities and possible cavitary lesions in the right upper lung.  In addition, the chest lymphadenopathy should be followed.  Original Report Authenticated By: Richarda Overlie, M.D.    Medications:  Scheduled:    . aspirin EC  81 mg Oral Daily  . atorvastatin  10 mg Oral q1800  . azithromycin  500 mg Intravenous Q24H  . digoxin  0.125 mg Oral Daily  . diltiazem  240  mg Oral Daily  . enoxaparin  40 mg Subcutaneous Q24H  . levalbuterol  0.63 mg Nebulization Q6H  . piperacillin-tazobactam (ZOSYN)  IV  3.375 g Intravenous Q8H  . tuberculin  5 Units Intradermal Once  . DISCONTD: cefTRIAXone (ROCEPHIN)  IV  1 g Intravenous Q24H  . DISCONTD: diltiazem  180 mg Oral Daily  . DISCONTD: metoprolol  12.5 mg Oral BID  . DISCONTD: simvastatin  20 mg Oral Daily   Continuous:    . 0.9 % NaCl with KCl 20 mEq / L 10 mL/hr at 07/29/11 0600  . DISCONTD: diltiazem (CARDIZEM) infusion Stopped (07/28/11 1130)   ZOX:[id number], DISCONTD: alprazolam  Assessment: Principal Problem:  *Acute respiratory failure with hypoxia Active Problems:  CAP (community acquired pneumonia)  Hypertension  Anxiety  Hyperglycemia  Elevated brain natriuretic peptide (BNP) level  Acute bronchitis  Lung nodules  Bulla of lung  Cavitary lesion of lung  Rapid atrial fibrillation   1. Abnormal CT scan. Noted are nodularity, bullous lesions/cavitary lesions, particularly in the right long. Also noted is extensive peribronchial thickening in the right lung. We're treating this as pneumonia and acute bronchitis. However, given his exposure to TB in the past, tuberculosis as a concern. Also, these CT findings may be secondary to her recent aspiration from mouthwash. CT was negative for PE. Antibiotics changed to Zosyn with continued azithromycin to cover anaerobes and atypical bacteria.  Acute hypoxic respiratory failure, secondary to the above. He is being treated with oxygen supplementation. His oxygen saturations are somewhat better.  Elevated BNP with no evidence of pulmonary edema radiographically. He was given IV Lasix once. 2-D echocardiogram pending.  (Newly diagnosed) Atrial fibrillation with rapid ventricular response. Apparently this is a new diagnosis. Status post Cardizem drip. His rate is trending back up on oral Cardizem started yesterday. Echocardiogram was ordered and is  pending. He is on aspirin for now.  Hyperglycemia. His hemoglobin A1c is 6.0. He may have prediabetes. This will just be followed.    Plan:  1. Sputum for AFB smears and cultures pending. We'll check the results of the PPD pending. 2. 2-D echocardiogram pending. 3. We'll start Mucinex to help with expectoration of sputum. 4. Will increase the dose of Cardizem to 240 mg daily. Will add daily dosing of digoxin. Consider restarting metoprolol when bronchospasms completely resolve. Continue aspirin for antiplatelet therapy.        LOS: 3 days   Cannan Beeck 07/29/2011, 7:57 AM

## 2011-07-29 NOTE — Progress Notes (Signed)
PHARMACIST - PHYSICIAN COMMUNICATION DR:   Sherrie Mustache CONCERNING: Antibiotic IV to Oral Route Change Policy  RECOMMENDATION: This patient is receiving Zithromax by the intravenous route.  Based on criteria approved by the Pharmacy and Therapeutics Committee, the antibiotic(s) is/are being converted to the equivalent oral dose form(s).   DESCRIPTION: These criteria include:  Patient being treated for a respiratory tract infection, urinary tract infection, or cellulitis  The patient is not neutropenic and does not exhibit a GI malabsorption state  The patient is eating (either orally or via tube) and/or has been taking other orally administered medications for a least 24 hours  The patient is improving clinically and has a Tmax < 100.5  If you have questions about this conversion, please contact the Pharmacy Department  [x]   [phone] )  [facility] []   [phone] )  [facility]  []   [phone] )  [facility] []   [phone] )  [facility]    Junita Push, PharmD, BCPS 07/29/2011@9 :03 AM

## 2011-07-29 NOTE — Progress Notes (Signed)
Subjective: He says he feels better. He has no new complaints. He is still in atrial fibrillation with a fairly rapid ventricular response. He's not coughing anything up.  Objective: Vital signs in last 24 hours: Temp:  [96.4 F (35.8 C)-98.4 F (36.9 C)] 97.9 F (36.6 C) (05/28 0400) Pulse Rate:  [93-144] 122  (05/28 0600) Resp:  [17-31] 31  (05/28 0600) BP: (112-175)/(45-132) 133/85 mmHg (05/28 0600) SpO2:  [90 %-96 %] 94 % (05/28 0600) Weight:  [67.8 kg (149 lb 7.6 oz)] 67.8 kg (149 lb 7.6 oz) (05/28 0500) Weight change: 1.6 kg (3 lb 8.4 oz) Last BM Date: 07/28/11  Intake/Output from previous day: 05/27 0701 - 05/28 0700 In: 2100.3 [P.O.:1300; I.V.:200.3; IV Piggyback:600] Out: 1325 [Urine:1325]  PHYSICAL EXAM General appearance: alert, cooperative and no distress Resp: rhonchi bilaterally Cardio: irregularly irregular rhythm GI: soft, non-tender; bowel sounds normal; no masses,  no organomegaly Extremities: extremities normal, atraumatic, no cyanosis or edema  Lab Results:    Basic Metabolic Panel:  Basename 07/28/11 0414 07/27/11 0330  NA 137 135  K 3.9 5.0  CL 98 100  CO2 33* 31  GLUCOSE 141* 196*  BUN 12 9  CREATININE 0.80 0.80  CALCIUM 8.5 7.9*  MG -- --  PHOS -- --   Liver Function Tests:  [facility] 07/26/11 1920  AST 16  ALT 12  ALKPHOS 70  BILITOT 0.2*  PROT 6.9  ALBUMIN 2.9*   No results found for this basename: LIPASE:2,AMYLASE:2 in the last 72 hours No results found for this basename: AMMONIA:2 in the last 72 hours CBC:  Basename 07/28/11 0414 07/26/11 1846  WBC 11.3* 11.4*  NEUTROABS -- 8.7*  HGB 12.7* 14.9  HCT 39.4 45.3  MCV 96.1 96.0  PLT 198 170   Cardiac Enzymes:  Basename 07/27/11 0329 07/26/11 1920  CKTOTAL 26 31  CKMB 2.2 1.9  CKMBINDEX -- --  TROPONINI <0.30 <0.30   BNP:  Basename 07/26/11 1920  PROBNP 2495.0*   D-Dimer:  Basename 07/27/11 0900  DDIMER 2.50*   CBG:  Basename 07/28/11 2124 07/28/11 1703  07/28/11 1201 07/28/11 0742 07/27/11 1644  GLUCAP 110* 106* 110* 124* 146*   Hemoglobin A1C:  Basename 07/27/11 0900  HGBA1C 6.0*   Fasting Lipid Panel: No results found for this basename: CHOL,HDL,LDLCALC,TRIG,CHOLHDL,LDLDIRECT in the last 72 hours Thyroid Function Tests:  Basename 07/28/11 0414  TSH 1.631  T4TOTAL --  FREET4 --  T3FREE --  THYROIDAB --   Anemia Panel: No results found for this basename: VITAMINB12,FOLATE,FERRITIN,TIBC,IRON,RETICCTPCT in the last 72 hours Coagulation: No results found for this basename: LABPROT:2,INR:2 in the last 72 hours Urine Drug Screen: Drugs of Abuse  No results found for this basename: labopia, cocainscrnur, labbenz, amphetmu, thcu, labbarb    Alcohol Level: No results found for this basename: ETH:2 in the last 72 hours Urinalysis:  Basename 07/26/11 2144  COLORURINE YELLOW  LABSPEC >1.030*  PHURINE 6.0  GLUCOSEU 100*  HGBUR SMALL*  BILIRUBINUR NEGATIVE  KETONESUR NEGATIVE  PROTEINUR 100*  UROBILINOGEN 0.2  NITRITE NEGATIVE  LEUKOCYTESUR NEGATIVE   Misc. Labs:  ABGS No results found for this basename: PHART,PCO2,PO2ART,TCO2,HCO3 in the last 72 hours CULTURES Recent Results (from the past 240 hour(s))  CULTURE, BLOOD (ROUTINE X 2)     Status: Normal (Preliminary result)   Collection Time   07/26/11  6:47 PM      Component Value Range Status Comment   Specimen Description BLOOD LEFT ARM   Final    Special Requests  BOTTLES DRAWN AEROBIC AND ANAEROBIC 4CC EAC   Final    Culture NO GROWTH 2 DAYS   Final    Report Status PENDING   Incomplete   CULTURE, BLOOD (ROUTINE X 2)     Status: Normal (Preliminary result)   Collection Time   07/26/11  6:49 PM      Component Value Range Status Comment   Specimen Description BLOOD LEFT ANTECUBITAL   Final    Special Requests BOTTLES DRAWN AEROBIC ONLY 8CC   Final    Culture NO GROWTH 2 DAYS   Final    Report Status PENDING   Incomplete   URINE CULTURE     Status: Normal    Collection Time   07/26/11  9:44 PM      Component Value Range Status Comment   Specimen Description URINE, CATHETERIZED   Final    Special Requests NONE   Final    Culture  Setup Time [id number]   Final    Colony Count NO GROWTH   Final    Culture NO GROWTH   Final    Report Status 07/28/2011 FINAL   Final   MRSA PCR SCREENING     Status: Normal   Collection Time   07/27/11 12:30 AM      Component Value Range Status Comment   MRSA by PCR NEGATIVE  NEGATIVE  Final   CULTURE, RESPIRATORY     Status: Normal (Preliminary result)   Collection Time   07/27/11 12:30 AM      Component Value Range Status Comment   Specimen Description SPU   Final    Special Requests NONE   Final    Gram Stain     Final    Value: NO WBC SEEN     FEW SQUAMOUS EPITHELIAL CELLS PRESENT     RARE GRAM POSITIVE COCCI IN PAIRS     RARE GRAM POSITIVE RODS     RARE GRAM NEGATIVE RODS   Culture Culture reincubated for better growth   Final    Report Status PENDING   Incomplete    Studies/Results: Ct Angio Chest W/cm &/or Wo Cm  07/27/2011  *RADIOLOGY REPORT*  Clinical Data: 76 year old with hypoxia and elevated D-dimer.  CT ANGIOGRAPHY CHEST  Technique:  Multidetector CT imaging of the chest using the standard protocol during bolus administration of intravenous contrast. Multiplanar reconstructed images including MIPs were obtained and reviewed to evaluate the vascular anatomy.  Contrast: OMNIPAQUE IOHEXOL 350 MG/ML SOLN  Comparison: Chest radiograph 07/27/2011  Findings: There is no evidence for pulmonary embolism.  There is edema in the upper abdomen, particularly near the kidneys.  There is diffuse atherosclerotic disease involving the thoracic aorta but no evidence for a dissection.  There is a small amount of pleural thickening or fluid bilaterally.  Prominent prevascular lymph node measuring 0.9 cm on sequence #4, image 39.  Precarinal lymph node measures 0.9 cm in the short axis.  Subcarinal lymph node  measures 1.5 cm on image 47.  The trachea and mainstem bronchi are patent. There is diffuse peribronchial thickening but this is most prominent in the right lower lobe and right middle lobe.  In fact, there is narrowing of the right lower lobe bronchus.  Diffuse interstitial thickening and parenchymal nodularity in the right lower lobe.  Subtle small nodular densities in the lower right upper lobe.  Emphysematous changes with irregular cavitary or bullous lesions in the right upper lobe.  Questionable 7 mm nodule in the right  upper lobe on sequence five, image 35.  There appears to be scarring at the left lung apex.  There are pleural-based densities along the dependent aspect of the left lower lobe which is indeterminate but could be related to atelectasis.  Mild peripheral nodularity in the left lower lobe.  No acute bony abnormality.  IMPRESSION: Negative for acute pulmonary embolism.  Diffuse peribronchial thickening, particularly in the right lower lobe where there is narrowing of the right lower lobe bronchus. There is diffuse interstitial and nodular disease in the right lower lung.  Findings are suspicious for an infectious etiology. There are similar more subtle findings in the right lower lobe and left lower lobe.  There are scattered irregular opacities with bulla/cavitary formations throughout the right upper lung.  There is mild mediastinal lymphadenopathy, particularly in the subcarinal station.  Recommend follow-up Chest CT within 2-3 months to evaluate the irregular opacities and possible cavitary lesions in the right upper lung.  In addition, the chest lymphadenopathy should be followed.  Original Report Authenticated By: Richarda Overlie, M.D.    Medications:  Scheduled:   . aspirin EC  81 mg Oral Daily  . atorvastatin  10 mg Oral q1800  . azithromycin  500 mg Intravenous Q24H  . diltiazem  180 mg Oral Daily  . enoxaparin  40 mg Subcutaneous Q24H  . levalbuterol  0.63 mg Nebulization Q6H  .  piperacillin-tazobactam (ZOSYN)  IV  3.375 g Intravenous Q8H  . tuberculin  5 Units Intradermal Once  . DISCONTD: cefTRIAXone (ROCEPHIN)  IV  1 g Intravenous Q24H  . DISCONTD: metoprolol  12.5 mg Oral BID  . DISCONTD: simvastatin  20 mg Oral Daily   Continuous:   . 0.9 % NaCl with KCl 20 mEq / L 10 mL/hr at 07/29/11 0600  . diltiazem (CARDIZEM) infusion Stopped (07/28/11 1130)   ZOX:[id number], DISCONTD: alprazolam  Assesment: He was admitted with hypoxia. He has a number of changes on the right side of his chest on his CT and less on the left. This appears to be some sort of an infectious problem. He is being treated for infections and he has been in atrial fibrillation with rapid ventricular response he says he's able to cough up a little bit of sputum. Principal Problem:  *Acute respiratory failure with hypoxia Active Problems:  CAP (community acquired pneumonia)  Hypertension  Anxiety  Hyperglycemia  Elevated brain natriuretic peptide (BNP) level  Acute bronchitis  Lung nodules  Bulla of lung  Cavitary lesion of lung  Rapid atrial fibrillation    Plan: Continue with current treatments. I think she's on good antibiotic coverage. I would not institute TB meds at this point.    LOS: 3 days   Walther Sanagustin L 07/29/2011, 7:48 AM

## 2011-07-30 DIAGNOSIS — I4891 Unspecified atrial fibrillation: Secondary | ICD-10-CM

## 2011-07-30 DIAGNOSIS — J209 Acute bronchitis, unspecified: Secondary | ICD-10-CM

## 2011-07-30 DIAGNOSIS — J159 Unspecified bacterial pneumonia: Secondary | ICD-10-CM

## 2011-07-30 DIAGNOSIS — R918 Other nonspecific abnormal finding of lung field: Secondary | ICD-10-CM

## 2011-07-30 LAB — BASIC METABOLIC PANEL
BUN: 5 mg/dL — ABNORMAL LOW (ref 6–23)
CO2: 33 mEq/L — ABNORMAL HIGH (ref 19–32)
Calcium: 8.5 mg/dL (ref 8.4–10.5)
GFR calc non Af Amer: >90 mL/min (ref >90)
Glucose, Bld: 119 mg/dL — ABNORMAL HIGH (ref 70–99)

## 2011-07-30 LAB — GLUCOSE, CAPILLARY

## 2011-07-30 LAB — CBC
MCH: 30.8 pg (ref 26.0–34.0)
MCHC: 33.4 g/dL (ref 30.0–36.0)
MCV: 92.3 fL (ref 78.0–100.0)
Platelets: 194 10*3/uL (ref 150–400)

## 2011-07-30 MED ORDER — METOPROLOL TARTRATE 25 MG PO TABS
25.0000 mg | ORAL_TABLET | Freq: Two times a day (BID) | ORAL | Status: DC
  Administered 2011-07-30 – 2011-07-31 (×3): 25 mg via ORAL
  Filled 2011-07-30 (×4): qty 1

## 2011-07-30 MED ORDER — DIGOXIN 250 MCG PO TABS
0.2500 mg | ORAL_TABLET | Freq: Every day | ORAL | Status: AC
  Administered 2011-07-30: 0.25 mg via ORAL
  Filled 2011-07-30: qty 1

## 2011-07-30 MED ORDER — SODIUM CHLORIDE 0.9 % IJ SOLN
INTRAMUSCULAR | Status: AC
  Filled 2011-07-30: qty 3

## 2011-07-30 MED ORDER — DIGOXIN 250 MCG PO TABS
0.5000 mg | ORAL_TABLET | Freq: Every day | ORAL | Status: AC
  Administered 2011-07-30: 0.5 mg via ORAL
  Filled 2011-07-30: qty 2

## 2011-07-30 MED ORDER — DIGOXIN 250 MCG PO TABS
0.2500 mg | ORAL_TABLET | Freq: Every day | ORAL | Status: DC
  Administered 2011-07-31 – 2011-08-01 (×2): 0.25 mg via ORAL
  Filled 2011-07-30 (×2): qty 1

## 2011-07-30 NOTE — Progress Notes (Signed)
TB test on Right forarm read at approx 11AM.

## 2011-07-30 NOTE — Consult Note (Addendum)
CARDIOLOGY CONSULT NOTE  Patient ID: Adam Ward MRN: [id number] DOB/AGE: [birth year] 76 y.o.  Admit date: 07/26/2011 Referring Physician: PTH Primary PhysicianNo primary provider on file. Primary Cardiologist: (new) Bellatrix Devonshire  Reason for Consultation: Atrial Fibrillation Principal Problem:  *Acute respiratory failure with hypoxia Active Problems:  CAP (community acquired pneumonia)  Hypertension  Anxiety  Hyperglycemia  Elevated brain natriuretic peptide (BNP) level  Acute bronchitis  Lung nodules  Bulla of lung  Cavitary lesion of lung  Rapid atrial fibrillation  HPI:  76 y/o patient with no known cardiac history admitted with severe hypoxia, after several days of cough and fever. On arrival to ER O2 sat of 66%. Was checked for PE per CT, and found to be negative. CXR demonstrated COPD, but no evidence of CHF or pulmonary edema. He was treated with nebulizers, steroids and azithromycin.  One day after admission he developed atrial fib with RVR. He was placed on cardizem gtt, albuterol was changed to xopenex. He was placed on ASA.  Review of systems complete and found to be negative unless listed above   Past Medical History  Diagnosis Date  . Hypertension   . High cholesterol   . Anxiety   . Cancer 1993    colon  . Rapid atrial fibrillation 07/28/2011    Family History  Problem Relation Age of Onset  . Heart disease Mother   . Stroke Father   . Pneumonia Sister   . Heart disease Brother     History   Social History  . Marital Status: Married    Spouse Name: N/A    Number of Children: N/A  . Years of Education: N/A   Occupational History  . Not on file.   Social History Main Topics  . Smoking status: Current Everyday Smoker -- 0.5 packs/day  . Smokeless tobacco: Not on file  . Alcohol Use: No  . Drug Use: No  . Sexually Active:    Other Topics Concern  . Not on file   Social History Narrative  . No narrative on file    Past Surgical History    Procedure Date  . Colon surgery   . Cervical disc surgery   . Tonsillectomy   . Appendectomy      Prescriptions prior to admission  Medication Sig Dispense Refill  . alprazolam (XANAX) 2 MG tablet Take 2 mg by mouth 3 (three) times daily.      Marland Kitchen aspirin EC 81 MG tablet Take 81 mg by mouth daily.      . metoprolol (LOPRESSOR) 50 MG tablet Take 50 mg by mouth 2 (two) times daily.      . Multiple Vitamins-Minerals (MULTIVITAMIN WITH MINERALS) tablet Take 1 tablet by mouth daily.      . Omega-3 Fatty Acids (FISH OIL) 1000 MG CAPS Take 1 capsule by mouth daily.      . simvastatin (ZOCOR) 20 MG tablet Take 20 mg by mouth daily.       Physical Exam: Blood pressure 137/80, pulse 101, temperature 97.8 F (36.6 C), temperature source Oral, resp. rate 21, height 6\' 1"  (1.854 m), weight 146 lb 2.6 oz (66.3 kg), SpO2 95.00%.  General: Well developed, well nourished, in no acute distress Head: Eyes PERRLA, No xanthomas.   Normal cephalic and atramatic  Lungs: Expiratory wheezes, rales at the right base.  Decreased breath sounds bilaterally Heart: HRIR distant heart sounds without MRG.  Pulses are 2+ & equal.  No carotid bruit. No JVD.  No abdominal bruits. No femoral bruits. Abdomen: Bowel sounds are positive, abdomen soft and non-tender without masses or                  Hernia's noted. Msk:  Back normal, normal gait. Normal strength and tone for age. Extremities: No clubbing, cyanosis or edema.  DP +1 Neuro: Alert and oriented X 3. Psych:  Good affect, responds appropriately  Labs: Lab Results  Component Value Date   WBC 10.9* 07/30/2011   HGB 13.2 07/30/2011   HCT 39.5 07/30/2011   MCV 92.3 07/30/2011   PLT 194 07/30/2011    Lab 07/30/11 0451 07/26/11 1920  NA 133* --  K 3.8 --  CL 94* --  CO2 33* --  BUN 5* --  CREATININE 0.72 --  CALCIUM 8.5 --  PROT -- 6.9  BILITOT -- 0.2*  ALKPHOS -- 70  ALT -- 12  AST -- 16  GLUCOSE 119* --   Lab Results  Component Value Date    CKTOTAL 26 07/27/2011   CKMB 2.2 07/27/2011   TROPONINI <0.30 07/27/2011    Radiology: Chest X-Ray 07/27/2011 IMPRESSION: Developing right basilar opacity suspicious for pneumonia. Emphysema.  CT Chest 07/27/2011 IMPRESSION: Negative for acute pulmonary embolism.  Diffuse peribronchial thickening, particularly in the right lower lobe where there is narrowing of the right lower lobe bronchus. There is diffuse interstitial and nodular disease in the right lower lung. Findings are suspicious for an infectious etiology. There are similar more subtle findings in the right lower lobe and left lower lobe.  There are scattered irregular opacities with bulla/cavitary formations throughout the right upper lung.  There is mild mediastinal lymphadenopathy, particularly in the subcarinal station.  EKG: 07/26/2011 Sinus tachycardia at a rate of 122 bpm; incomplete right bundle branch block; delayed R-wave progression-cannot exclude previous septal MI; no change when compared with prior tracing performed 01/07/2008   Telemetry: Atrial fib rate in the 80-90's.  ASSESSMENT AND PLAN:   1. New onset Atrial fib with RVR: Currently rate controlled after transition to po cardizem 240 mg daily, after IV infusion, and addition of lopressor 25 mg BID. CHAD's score 2 (hypertension and age). Currently on ASA. This may have been exacerbated by acute illness. Here is no evidence of anemia or CHF. Troponin is negative. TSH is WNL.  For now, rate control and ASA are recommended.    2. COPD: Newly diagnosed with pneumonia. He is being followed by Dr.Hawkins with steriods, abx and breathing treatments. He is responding well with plans to move to telemetry. TB testing is in progress. Remains afebrile.  Bettey Mare. [name] NP [facility] 07/30/2011, 2:16 PM  Cardiology Attending Patient interviewed and examined. Discussed with Joni Reining, NP.  Above note annotated and modified based upon my findings.   Patient presents with pneumonia and atrial fibrillation, most likely with the former causing the latter. As pneumonia improves, atrial fibrillation may convert to sinus. In the interim, agree with plans for control of heart rate and modest anticoagulation with aspirin.  Complete load with digoxin. If heart rate is better controlled, discontinue metoprolol.  [place] Bing, MD 07/30/2011, 4:40 PM

## 2011-07-30 NOTE — Progress Notes (Signed)
UR chart review completed.  

## 2011-07-30 NOTE — Progress Notes (Signed)
Subjective: The patient says he is breathing somewhat better. He continues to have a productive cough with tan color sputum.   Objective: Vital signs in last 24 hours: Filed Vitals:   07/30/11 0500 07/30/11 0600 07/30/11 0700 07/30/11 0803  BP: 153/75 127/64 136/60   Pulse: 113 102 106   Temp:      TempSrc:      Resp: 24 17 19    Height:      Weight: 66.3 kg (146 lb 2.6 oz)     SpO2: 94% 91% 94% 95%    Intake/Output Summary (Last 24 hours) at 07/30/11 0842 Last data filed at 07/30/11 9562  Gross per 24 hour  Intake   1770 ml  Output   2150 ml  Net   -380 ml    Weight change: -1.5 kg (-3 lb 4.9 oz)  Physical exam: General: Pleasant alert 76 year old Caucasian man, lying in bed in no acute distress. Lungs: Rare crackles noted on the right and mild expiratory wheezes. Breathing is nonlabored. Heart: Irregular, irregular, with tachycardia. Abdomen: Positive bowel sounds, soft, nontender, nondistended. Extremities: No pedal edema. Neurologic/psychiatric: He has a flat affect. He is alert and oriented x2. Cranial nerves II through XII are grossly intact.  Lab Results: Basic Metabolic Panel:  Basename 07/30/11 0451 07/28/11 0414  NA 133* 137  K 3.8 3.9  CL 94* 98  CO2 33* 33*  GLUCOSE 119* 141*  BUN 5* 12  CREATININE 0.72 0.80  CALCIUM 8.5 8.5  MG -- --  PHOS -- --   Liver Function Tests: No results found for this basename: AST:2,ALT:2,ALKPHOS:2,BILITOT:2,PROT:2,ALBUMIN:2 in the last 72 hours No results found for this basename: LIPASE:2,AMYLASE:2 in the last 72 hours No results found for this basename: AMMONIA:2 in the last 72 hours CBC:  Basename 07/30/11 0451 07/28/11 0414  WBC 10.9* 11.3*  NEUTROABS -- --  HGB 13.2 12.7*  HCT 39.5 39.4  MCV 92.3 96.1  PLT 194 198   Cardiac Enzymes: No results found for this basename: CKTOTAL:3,CKMB:3,CKMBINDEX:3,TROPONINI:3 in the last 72 hours BNP: No results found for this basename: PROBNP:3 in the last 72  hours D-Dimer:  Basename 07/27/11 0900  DDIMER 2.50*   CBG:  Basename 07/30/11 0743 07/29/11 2130 07/29/11 1645 07/29/11 1205 07/29/11 0753 07/28/11 2124  GLUCAP 103* 121* 115* 122* 119* 110*   Hemoglobin A1C:  Basename 07/27/11 0900  HGBA1C 6.0*   Fasting Lipid Panel: No results found for this basename: CHOL,HDL,LDLCALC,TRIG,CHOLHDL,LDLDIRECT in the last 72 hours Thyroid Function Tests:  Basename 07/28/11 0414  TSH 1.631  T4TOTAL --  FREET4 --  T3FREE --  THYROIDAB --   Anemia Panel: No results found for this basename: VITAMINB12,FOLATE,FERRITIN,TIBC,IRON,RETICCTPCT in the last 72 hours Coagulation: No results found for this basename: LABPROT:2,INR:2 in the last 72 hours Urine Drug Screen: Drugs of Abuse  No results found for this basename: labopia,  cocainscrnur,  labbenz,  amphetmu,  thcu,  labbarb    Alcohol Level: No results found for this basename: ETH:2 in the last 72 hours Urinalysis: No results found for this basename: COLORURINE:2,APPERANCEUR:2,LABSPEC:2,PHURINE:2,GLUCOSEU:2,HGBUR:2,BILIRUBINUR:2,KETONESUR:2,PROTEINUR:2,UROBILINOGEN:2,NITRITE:2,LEUKOCYTESUR:2 in the last 72 hours Misc. Labs:   Micro: Recent Results (from the past 240 hour(s))  CULTURE, BLOOD (ROUTINE X 2)     Status: Normal (Preliminary result)   Collection Time   07/26/11  6:47 PM      Component Value Range Status Comment   Specimen Description BLOOD LEFT ARM   Final    Special Requests BOTTLES DRAWN AEROBIC AND ANAEROBIC 4CC EAC  Final    Culture NO GROWTH 3 DAYS   Final    Report Status PENDING   Incomplete   CULTURE, BLOOD (ROUTINE X 2)     Status: Normal (Preliminary result)   Collection Time   07/26/11  6:49 PM      Component Value Range Status Comment   Specimen Description BLOOD LEFT ANTECUBITAL   Final    Special Requests BOTTLES DRAWN AEROBIC ONLY 8CC   Final    Culture NO GROWTH 3 DAYS   Final    Report Status PENDING   Incomplete   URINE CULTURE     Status: Normal    Collection Time   07/26/11  9:44 PM      Component Value Range Status Comment   Specimen Description URINE, CATHETERIZED   Final    Special Requests NONE   Final    Culture  Setup Time [id number]   Final    Colony Count NO GROWTH   Final    Culture NO GROWTH   Final    Report Status 07/28/2011 FINAL   Final   MRSA PCR SCREENING     Status: Normal   Collection Time   07/27/11 12:30 AM      Component Value Range Status Comment   MRSA by PCR NEGATIVE  NEGATIVE  Final   CULTURE, RESPIRATORY     Status: Normal   Collection Time   07/27/11 12:30 AM      Component Value Range Status Comment   Specimen Description SPU   Final    Special Requests NONE   Final    Gram Stain     Final    Value: NO WBC SEEN     FEW SQUAMOUS EPITHELIAL CELLS PRESENT     RARE GRAM POSITIVE COCCI IN PAIRS     RARE GRAM POSITIVE RODS     RARE GRAM NEGATIVE RODS   Culture NORMAL OROPHARYNGEAL FLORA   Final    Report Status 07/29/2011 FINAL   Final     Studies/Results: No results found.  Medications:  Scheduled:    . aspirin EC  81 mg Oral Daily  . atorvastatin  10 mg Oral q1800  . azithromycin  500 mg Oral q1800  . digoxin  0.125 mg Oral Daily  . diltiazem  240 mg Oral Daily  . enoxaparin  40 mg Subcutaneous Q24H  . guaiFENesin  600 mg Oral BID  . levalbuterol  0.63 mg Nebulization Q6H  . metoprolol tartrate  25 mg Oral BID  . piperacillin-tazobactam (ZOSYN)  IV  3.375 g Intravenous Q8H  . sodium chloride      . DISCONTD: azithromycin  500 mg Intravenous Q24H   Continuous:    . 0.9 % NaCl with KCl 20 mEq / L 10 mL/hr at 07/30/11 0600   ONG:[id number]  Assessment: Principal Problem:  *Acute respiratory failure with hypoxia Active Problems:  CAP (community acquired pneumonia)  Hypertension  Anxiety  Hyperglycemia  Elevated brain natriuretic peptide (BNP) level  Acute bronchitis  Lung nodules  Bulla of lung  Cavitary lesion of lung  Rapid atrial fibrillation   1. Abnormal CT scan.  Noted are nodularity, bullous lesions/cavitary lesions, particularly in the right long. Also noted is extensive peribronchial thickening in the right lung. We're treating this as pneumonia and acute bronchitis. However, given his exposure to TB in the past, tuberculosis as a concern. Also, these CT findings may be secondary to her recent aspiration from mouthwash. CT was negative for  PE. Antibiotics changed to Zosyn with continued azithromycin to cover anaerobes and atypical bacteria. AFB smears have been ordered.  Acute hypoxic respiratory failure, secondary to the above. He is being treated with oxygen supplementation. His oxygen saturations are somewhat better.  Elevated BNP with no evidence of pulmonary edema radiographically. He was given IV Lasix once. 2-D echocardiogram pending.  (Newly diagnosed) Atrial fibrillation with rapid ventricular response. Apparently this is a new diagnosis. Status post Cardizem drip. Oral Cardizem titrated up. Digoxin started. Will restart metoprolol at a lower dose. Eocardiogram was ordered and is pending. He is on aspirin for now. CHAD2 score is 1.  Hyperglycemia. His hemoglobin A1c is 6.0. He may have prediabetes. This will just be followed.    Plan:  1. Sputum for AFB smears and cultures pending. We'll check the results of the PPD pending. 2. 2-D echocardiogram results pending. 3. Restart metoprolol at a lower dose. 4. Encourage the patient to get out of bed to the chair and work with the physical therapist.          LOS: 4 days   Afton Mikelson 07/30/2011, 8:42 AM

## 2011-07-30 NOTE — Care Management Note (Signed)
    Page 1 of 2   08/01/2011     3:14:23 PM   CARE MANAGEMENT NOTE 08/01/2011  Patient:  Adam Ward, Adam Ward   Account Number:  [id number]  Date Initiated:  07/30/2011  Documentation initiated by:  Sharrie Rothman  Subjective/Objective Assessment:   Pt admitted from home with pneumonia. Pt lives with wife and is fairly independent with ADL's PTA. Pt has no HH or equipment in the home at this time. Pt will return home at discharge.     Action/Plan:   Pt agrees to [facility] RN at discharge. Will arrange if needed.   Anticipated DC Date:  08/01/2011   Anticipated DC Plan:  HOME W HOME HEALTH SERVICES      DC Planning Services  CM consult      PAC Choice  DURABLE MEDICAL EQUIPMENT  HOME HEALTH   Choice offered to / List presented to:  C-3 Spouse      DME agency  [facility]     [facility] arranged  HH-1 RN  HH-10 DISEASE MANAGEMENT  HH-2 PT      HH agency  [facility]   Status of service:  Completed, signed off Medicare Important Message given?   (If response is "NO", the following Medicare IM given date fields will be blank) Date Medicare IM given:   Date Additional Medicare IM given:    Discharge Disposition:  HOME W HOME HEALTH SERVICES  Per UR Regulation:  Reviewed for med. necessity/level of care/duration of stay  If discussed at Long Length of Stay Meetings, dates discussed:    Comments:  08/01/11 1500 Anibal Henderson RN - Lives at home with spouse and disabled son. Son is Ward paraplegic, in Ward W/C but fairly independent.  Pt has PNA, wih respiratory failure and hypoxia, and Hx COPD. O2 sat 85% on RA and will go home on O2. He needs Ward rolling walker per PT also, and he and spouse agree with this. [facility] to deliver.  Rn to visit in 1-2 days  son 07/30/11 1458 Arlyss Queen, RN BSN CM

## 2011-07-30 NOTE — Progress Notes (Signed)
NAME:  WING, SCHOCH NO.:  [id number]  MEDICAL RECORD NO.:  [id number]  LOCATION:  A341                          FACILITY:  [facility]  PHYSICIAN:  Neziah Vogelgesang L. Juanetta Gosling, M.D.DATE OF BIRTH:  03-05-1935  DATE OF PROCEDURE: DATE OF DISCHARGE:                                PROGRESS NOTE   SUBJECTIVE:  Mr. Dykstra was admitted with hypoxia.  He has abnormal chest CT and has COPD.  He has no new complaints this morning and says he feels fairly well.  PHYSICAL EXAMINATION:  GENERAL:  He is awake and alert. HEENT:  Pupils are equal, round, reactive to light and accommodation. Nose and throat are clear.  His mucous membranes are moist. CHEST:  Some rhonchi. HEART:  Regular now.  He has been in atrial fib.  ASSESSMENT:  He has multiple problems.  PLAN:  As to continue with current treatments and follow.  No changes today.  We are awaiting his AFB smears.     Tytionna Cloyd L. Juanetta Gosling, M.D.     ELH/MEDQ  D:  07/30/2011  T:  07/30/2011  Job:  [id number]

## 2011-07-30 NOTE — Progress Notes (Signed)
(  Cont. From last entry) Pt's PPD negative. Report given to Scherrie Merritts, RN. Pt being transferred to dept 300 room 341 which is a negative pressure room. Pt alert, oriented and in stable condition. Pt to be on tele upon arrival to the floor.

## 2011-07-30 NOTE — Progress Notes (Signed)
Physical Therapy Treatment Patient Details Name: Adam Ward MRN: [id number] DOB: [birth year] Today's Date: 07/30/2011 Time: 6578-4696 PT Time Calculation (min): 38 min  PT Assessment / Plan / Recommendation Comments on Treatment Session  Pt completed all therapy well this morning. Able to complete a total of 20' of gait training with RW;supervision. Patient complaining of "all the wires" he's attached to, offered to put on portable but patient not agreeable to leave room. continue to increase endurance with gait.    Follow Up Recommendations       Barriers to Discharge        Equipment Recommendations       Recommendations for Other Services    Frequency     Plan      Precautions / Restrictions     Pertinent Vitals/Pain     Mobility  Bed Mobility Bed Mobility: Supine to Sit Supine to Sit: 7: Independent Transfers Transfers: Sit to Stand;Stand to Sit Sit to Stand: Without upper extremity assist;4: Min guard Stand to Sit: 5: Supervision Details for Transfer Assistance: verbal cues for hand placement Ambulation/Gait Ambulation/Gait Assistance: 5: Supervision Ambulation Distance (Feet): 20 Feet (10 x2 with seated rest break) Assistive device: Rolling walker Gait Pattern: Trunk flexed;Decreased stride length Gait velocity: slow Stairs: No Wheelchair Mobility Wheelchair Mobility: No    Exercises General Exercises - Upper Extremity Shoulder Horizontal ABduction: Both;10 reps Shoulder Horizontal ADduction: 10 reps;Both General Exercises - Lower Extremity Long Arc Quad: 10 reps;Both Heel Slides: Both;10 reps Hip ABduction/ADduction: Both;10 reps Straight Leg Raises: 10 reps;Both Toe Raises: Both;10 reps Heel Raises: 10 reps;Both Other Exercises Other Exercises: sit<>standx7 with and without UE support Other Exercises: standing static and dynamic activities; 2-3 mins   PT Diagnosis:    PT Problem List:   PT Treatment Interventions:     PT Goals Acute Rehab PT  Goals PT Goal: Sit to Supine/Side - Progress: Met PT Goal: Sit to Stand - Progress: Progressing toward goal PT Goal: Ambulate - Progress: Progressing toward goal  Visit Information  Last PT Received On: 07/30/11    Subjective Data      Cognition       Balance     End of Session PT - End of Session Equipment Utilized During Treatment: Gait belt Activity Tolerance: Patient tolerated treatment well Patient left: in chair;with call bell/phone within reach;with nursing in room Nurse Communication: Mobility status    [name] 07/30/2011, 12:01 PM

## 2011-07-31 DIAGNOSIS — J159 Unspecified bacterial pneumonia: Secondary | ICD-10-CM

## 2011-07-31 DIAGNOSIS — J209 Acute bronchitis, unspecified: Secondary | ICD-10-CM

## 2011-07-31 DIAGNOSIS — I4891 Unspecified atrial fibrillation: Secondary | ICD-10-CM

## 2011-07-31 DIAGNOSIS — R918 Other nonspecific abnormal finding of lung field: Secondary | ICD-10-CM

## 2011-07-31 DIAGNOSIS — J9600 Acute respiratory failure, unspecified whether with hypoxia or hypercapnia: Secondary | ICD-10-CM

## 2011-07-31 DIAGNOSIS — J189 Pneumonia, unspecified organism: Secondary | ICD-10-CM

## 2011-07-31 DIAGNOSIS — J984 Other disorders of lung: Secondary | ICD-10-CM

## 2011-07-31 LAB — CULTURE, BLOOD (ROUTINE X 2)
Culture: NO GROWTH
Culture: NO GROWTH

## 2011-07-31 LAB — GLUCOSE, CAPILLARY
Glucose-Capillary: 110 mg/dL — ABNORMAL HIGH (ref 70–99)
Glucose-Capillary: 111 mg/dL — ABNORMAL HIGH (ref 70–99)

## 2011-07-31 NOTE — Progress Notes (Addendum)
Consulting cardiologist: Dr. Dietrich Pates  SUBJECTIVE:Feeling somewhat better, continues weak. Continues productive coughing.   LABS: Basic Metabolic Panel:  Basename 07/30/11 0451  NA 133*  K 3.8  CL 94*  CO2 33*  GLUCOSE 119*  BUN 5*  CREATININE 0.72  CALCIUM 8.5  MG --  PHOS --   CBC:  Basename 07/30/11 0451  WBC 10.9*  NEUTROABS --  HGB 13.2  HCT 39.5  MCV 92.3  PLT 194   Echo: Report pending. Radiology: CT chest 07/27/2011  IMPRESSION: Negative for acute pulmonary embolism. Diffuse peribronchial thickening, particularly in the right lower lobe where there is narrowing of the right lower lobe bronchus. There is diffuse interstitial and nodular disease in the right lower lung. Findings are suspicious for an infectious etiology. There are similar more subtle findings in the right lower lobe and left lower lobe. There are scattered irregular opacities with bulla/cavitary formations throughout the right upper lung. There is mild mediastinal lymphadenopathy, particularly in the subcarinal station.Recommend follow-up Chest CT within 2-3 months to evaluate the irregular opacities and possible cavitary lesions in the right upper lung. In addition, the chest lymphadenopathy should be followed.   PHYSICAL EXAM BP 133/75  Pulse 93  Temp(Src) 98.3 F (36.8 C) (Oral)  Resp 20  Ht 6\' 1"  (1.854 m)  Wt 144 lb 3.2 oz (65.409 kg)  BMI 19.02 kg/m2  SpO2 92% General: Well developed, well nourished, in no acute distress Head: Eyes PERRLA, No xanthomas.   Normal cephalic and atramatic  Lungs: Wheezes noted on the right. None on the left.  Heart: HRIR  No MRG .  Pulses are 2+ & equal.            No carotid bruit. No JVD.  No abdominal bruits. No femoral bruits. Abdomen: Bowel sounds are positive, abdomen soft and non-tender without masses or                  Hernia's noted. Msk:  Back normal, normal gait. Normal strength and tone for age. Extremities: No clubbing, cyanosis  or edema.  DP +1 Neuro: Alert and oriented X 3. Psych:  Good affect, responds appropriately  TELEMETRY: Reviewed telemetry pt in: Atrial fibrillation rates in the 70's.  ASSESSMENT AND PLAN:  1. New Onset Atrial Fibrillation: Thought initially to be related to acute illness, pneumonia and COPD. Now being loaded with digoxin, along with continuation of diltiazem and metoprolol. Heart rate is controlled currently. CHADs score of 2. He has some lung lesions that I am not sure will be investigated further with biopsies, so holding off of coumadin or novel anticoagulant for now. Continue ASA.   2. COPD: Pneumonia is also prominent. He is followed by Dr. Juanetta Gosling for this.    Bettey Mare. [name] NP [facility] 07/31/2011, 9:00 AM  Attending note:  Patient seen and examined. Reviewed recent consultation note and clinical course. Heart rate control in atrial fibrillation has improved on medical therapy. He is not anticoagulated at this time as detailed above. Echocardiogram from 5/28 shows LVEF of 60-65% based on very limited images, inadequate assessment for focal wall motion. Moderate tricuspid regurgitation noted with evidence of mild pulmonary hypertension. Trivial pericardial effusion also noted. We are following with you.  Jonelle Sidle, M.D., F.A.C.C.

## 2011-07-31 NOTE — Progress Notes (Signed)
Physical Therapy Treatment Patient Details Name: Adam Ward MRN: [id number] DOB: [birth year] Today's Date: 07/31/2011 Time: 6440-3474 PT Time Calculation (min): 21 min  PT Assessment / Plan / Recommendation Comments on Treatment Session  Pt pleasantly irritated with "all of the people coming and going";however compliant with all therapy and did well today. Pt was able to complete 100' of gait training with RW;supervision on 3L of portable to O2. O2 sats remained at 91% and above for entire session. Patient did not want to amb in hallways "doesn't want to be seen like this" Attempt stairs next treatment if appropriate    Follow Up Recommendations       Barriers to Discharge        Equipment Recommendations       Recommendations for Other Services    Frequency     Plan      Precautions / Restrictions Restrictions Weight Bearing Restrictions: No   Pertinent Vitals/Pain     Mobility  Transfers Sit to Stand: 6: Modified independent (Device/Increase time) Stand to Sit: 6: Modified independent (Device/Increase time) Ambulation/Gait Ambulation/Gait Assistance: 5: Supervision Ambulation Distance (Feet): 100 Feet (in room;patient not wanting to wall in hallway) Gait velocity: slow    Exercises General Exercises - Upper Extremity Shoulder Horizontal ABduction: Both;10 reps Shoulder Horizontal ADduction: 10 reps;Both General Exercises - Lower Extremity Long Arc Quad: Both;10 reps Other Exercises Other Exercises: sit<>stand x10; first 5 without UE support   PT Diagnosis:    PT Problem List:   PT Treatment Interventions:     PT Goals Acute Rehab PT Goals PT Goal: Sit to Stand - Progress: Met PT Goal: Ambulate - Progress: Progressing toward goal PT Goal: Up/Down Stairs - Progress: Not met  Visit Information  Last PT Received On: 07/31/11    Subjective Data  Subjective: Pt states he is tired. People always coming and going, alarms going off all night. I just want to rest   Cognition       Balance     End of Session PT - End of Session Equipment Utilized During Treatment: Gait belt Activity Tolerance: Patient tolerated treatment well Patient left: in chair;with call bell/phone within reach    Nery Frappier ATKINSO 07/31/2011, 11:06 AM

## 2011-07-31 NOTE — Progress Notes (Signed)
ANTIBIOTIC CONSULT NOTE   Pharmacy Consult for Zosyn Indication: pneumonia  Allergies  Allergen Reactions  . Celebrex (Celecoxib)   . Tetracyclines & Related Hives   Patient Measurements: Height: 6\' 1"  (185.4 cm) Weight: 144 lb 3.2 oz (65.409 kg) IBW/kg (Calculated) : 79.9   Vital Signs: Temp: 98.3 F (36.8 C) (05/30 0629) Temp src: Oral (05/30 0629) BP: 133/75 mmHg (05/30 0629) Pulse Rate: 79  (05/30 1023) Intake/Output from previous day: 05/29 0701 - 05/30 0700 In: 1486.3 [P.O.:1200; I.V.:236.3; IV Piggyback:50] Out: 600 [Urine:600] Intake/Output from this shift: Total I/O In: -  Out: 250 [Urine:250]  Labs:  [place] 07/30/11 0451  WBC 10.9*  HGB 13.2  PLT 194  LABCREA --  CREATININE 0.72   Estimated Creatinine Clearance: 76.1 ml/min (by C-G formula based on Cr of 0.72). No results found for this basename: VANCOTROUGH:2,VANCOPEAK:2,VANCORANDOM:2,GENTTROUGH:2,GENTPEAK:2,GENTRANDOM:2,TOBRATROUGH:2,TOBRAPEAK:2,TOBRARND:2,AMIKACINPEAK:2,AMIKACINTROU:2,AMIKACIN:2, in the last 72 hours   Microbiology: Recent Results (from the past 720 hour(s))  CULTURE, BLOOD (ROUTINE X 2)     Status: Normal   Collection Time   07/26/11  6:47 PM      Component Value Range Status Comment   Specimen Description BLOOD LEFT ARM DRAWN BY RN   Final    Special Requests BOTTLES DRAWN AEROBIC AND ANAEROBIC 4CC   Final    Culture NO GROWTH 5 DAYS   Final    Report Status 07/31/2011 FINAL   Final   CULTURE, BLOOD (ROUTINE X 2)     Status: Normal   Collection Time   07/26/11  6:49 PM      Component Value Range Status Comment   Specimen Description BLOOD LEFT ANTECUBITAL DRAWN BY RN   Final    Special Requests BOTTLES DRAWN AEROBIC ONLY 8CC   Final    Culture NO GROWTH 5 DAYS   Final    Report Status 07/31/2011 FINAL   Final   URINE CULTURE     Status: Normal   Collection Time   07/26/11  9:44 PM      Component Value Range Status Comment   Specimen Description URINE, CATHETERIZED   Final     Special Requests NONE   Final    Culture  Setup Time [id number]   Final    Colony Count NO GROWTH   Final    Culture NO GROWTH   Final    Report Status 07/28/2011 FINAL   Final   MRSA PCR SCREENING     Status: Normal   Collection Time   07/27/11 12:30 AM      Component Value Range Status Comment   MRSA by PCR NEGATIVE  NEGATIVE  Final   CULTURE, RESPIRATORY     Status: Normal   Collection Time   07/27/11 12:30 AM      Component Value Range Status Comment   Specimen Description SPU   Final    Special Requests NONE   Final    Gram Stain     Final    Value: NO WBC SEEN     FEW SQUAMOUS EPITHELIAL CELLS PRESENT     RARE GRAM POSITIVE COCCI IN PAIRS     RARE GRAM POSITIVE RODS     RARE GRAM NEGATIVE RODS   Culture NORMAL OROPHARYNGEAL FLORA   Final    Report Status 07/29/2011 FINAL   Final   AFB CULTURE WITH SMEAR     Status: Normal (Preliminary result)   Collection Time   07/29/11  9:24 AM      Component Value Range  Status Comment   Specimen Description SPUTUM EXPECTORATED   Final    Special Requests NONE   Final    ACID FAST SMEAR NO ACID FAST BACILLI SEEN   Final    Culture     Final    Value: CULTURE WILL BE EXAMINED FOR 6 WEEKS BEFORE ISSUING A FINAL REPORT   Report Status PENDING   Incomplete    Medical History: Past Medical History  Diagnosis Date  . Hypertension   . High cholesterol   . Anxiety   . Cancer 1993    colon  . Rapid atrial fibrillation 07/28/2011   Medications:  Scheduled:     . aspirin EC  81 mg Oral Daily  . atorvastatin  10 mg Oral q1800  . azithromycin  500 mg Oral q1800  . digoxin  0.25 mg Oral Daily  . digoxin  0.25 mg Oral Daily  . digoxin  0.5 mg Oral Daily  . diltiazem  240 mg Oral Daily  . enoxaparin  40 mg Subcutaneous Q24H  . guaiFENesin  600 mg Oral BID  . levalbuterol  0.63 mg Nebulization Q6H  . metoprolol tartrate  25 mg Oral BID  . piperacillin-tazobactam (ZOSYN)  IV  3.375 g Intravenous Q8H  . sodium chloride      .  DISCONTD: digoxin  0.125 mg Oral Daily   Assessment: On IV Zosyn and PO Zithromax. Afebrile but has abnormal chest CT and COPD.  Goal of Therapy:  Eradicate infection.  Plan:  1) Zosyn 3.375gm IV Q8h to be infused over 4hrs. 2) Monitor renal function and cx data. 3) Continue Zithromax 500mg  PO Q24h.  Margo Aye, Josue Falconi A 07/31/2011,10:43 AM

## 2011-07-31 NOTE — Progress Notes (Signed)
Subjective: The patient says he is breathing somewhat better. He has less of a cough. No chest palpitations or chest pain.   Objective: Vital signs in last 24 hours: Filed Vitals:   07/30/11 2138 07/31/11 0629 07/31/11 0849 07/31/11 1023  BP: 132/87 133/75    Pulse: 83 93  79  Temp: 98.1 F (36.7 C) 98.3 F (36.8 C)    TempSrc: Oral Oral    Resp: 20 20    Height:      Weight:  65.409 kg (144 lb 3.2 oz)    SpO2: 96% 96% 92%     Intake/Output Summary (Last 24 hours) at 07/31/11 1333 Last data filed at 07/31/11 1023  Gross per 24 hour  Intake 696.33 ml  Output    850 ml  Net -153.67 ml    Weight change: -0.891 kg (-1 lb 15.4 oz)  Physical exam: General: Pleasant alert 76 year old Caucasian man, lying in bed in no acute distress. Lungs: less wheezing; rare crackles.  Breathing is nonlabored. Heart: Irregular, irregular, with tachycardia. Abdomen: Positive bowel sounds, soft, nontender, nondistended. Extremities: No pedal edema. Neurologic/psychiatric: He has a flat affect. He is alert and oriented x2. Cranial nerves II through XII are grossly intact.  Lab Results: Basic Metabolic Panel:  Basename 07/30/11 0451  NA 133*  K 3.8  CL 94*  CO2 33*  GLUCOSE 119*  BUN 5*  CREATININE 0.72  CALCIUM 8.5  MG --  PHOS --   Liver Function Tests: No results found for this basename: AST:2,ALT:2,ALKPHOS:2,BILITOT:2,PROT:2,ALBUMIN:2 in the last 72 hours No results found for this basename: LIPASE:2,AMYLASE:2 in the last 72 hours No results found for this basename: AMMONIA:2 in the last 72 hours CBC:  Basename 07/30/11 0451  WBC 10.9*  NEUTROABS --  HGB 13.2  HCT 39.5  MCV 92.3  PLT 194   Cardiac Enzymes: No results found for this basename: CKTOTAL:3,CKMB:3,CKMBINDEX:3,TROPONINI:3 in the last 72 hours BNP: No results found for this basename: PROBNP:3 in the last 72 hours D-Dimer: No results found for this basename: DDIMER:2 in the last 72 hours CBG:  Basename  07/31/11 1127 07/31/11 0727 07/30/11 1612 07/30/11 1142 07/30/11 0743 07/29/11 2130  GLUCAP 102* 110* 98 103* 103* 121*   Hemoglobin A1C: No results found for this basename: HGBA1C in the last 72 hours Fasting Lipid Panel: No results found for this basename: CHOL,HDL,LDLCALC,TRIG,CHOLHDL,LDLDIRECT in the last 72 hours Thyroid Function Tests: No results found for this basename: TSH,T4TOTAL,FREET4,T3FREE,THYROIDAB in the last 72 hours Anemia Panel: No results found for this basename: VITAMINB12,FOLATE,FERRITIN,TIBC,IRON,RETICCTPCT in the last 72 hours Coagulation: No results found for this basename: LABPROT:2,INR:2 in the last 72 hours Urine Drug Screen: Drugs of Abuse  No results found for this basename: labopia,  cocainscrnur,  labbenz,  amphetmu,  thcu,  labbarb    Alcohol Level: No results found for this basename: ETH:2 in the last 72 hours Urinalysis: No results found for this basename: COLORURINE:2,APPERANCEUR:2,LABSPEC:2,PHURINE:2,GLUCOSEU:2,HGBUR:2,BILIRUBINUR:2,KETONESUR:2,PROTEINUR:2,UROBILINOGEN:2,NITRITE:2,LEUKOCYTESUR:2 in the last 72 hours Misc. Labs:   Micro: Recent Results (from the past 240 hour(s))  CULTURE, BLOOD (ROUTINE X 2)     Status: Normal   Collection Time   07/26/11  6:47 PM      Component Value Range Status Comment   Specimen Description BLOOD LEFT ARM DRAWN BY RN   Final    Special Requests BOTTLES DRAWN AEROBIC AND ANAEROBIC 4CC   Final    Culture NO GROWTH 5 DAYS   Final    Report Status 07/31/2011 FINAL   Final  CULTURE, BLOOD (ROUTINE X 2)     Status: Normal   Collection Time   07/26/11  6:49 PM      Component Value Range Status Comment   Specimen Description BLOOD LEFT ANTECUBITAL DRAWN BY RN   Final    Special Requests BOTTLES DRAWN AEROBIC ONLY 8CC   Final    Culture NO GROWTH 5 DAYS   Final    Report Status 07/31/2011 FINAL   Final   URINE CULTURE     Status: Normal   Collection Time   07/26/11  9:44 PM      Component Value Range Status  Comment   Specimen Description URINE, CATHETERIZED   Final    Special Requests NONE   Final    Culture  Setup Time [id number]   Final    Colony Count NO GROWTH   Final    Culture NO GROWTH   Final    Report Status 07/28/2011 FINAL   Final   MRSA PCR SCREENING     Status: Normal   Collection Time   07/27/11 12:30 AM      Component Value Range Status Comment   MRSA by PCR NEGATIVE  NEGATIVE  Final   CULTURE, RESPIRATORY     Status: Normal   Collection Time   07/27/11 12:30 AM      Component Value Range Status Comment   Specimen Description SPU   Final    Special Requests NONE   Final    Gram Stain     Final    Value: NO WBC SEEN     FEW SQUAMOUS EPITHELIAL CELLS PRESENT     RARE GRAM POSITIVE COCCI IN PAIRS     RARE GRAM POSITIVE RODS     RARE GRAM NEGATIVE RODS   Culture NORMAL OROPHARYNGEAL FLORA   Final    Report Status 07/29/2011 FINAL   Final   AFB CULTURE WITH SMEAR     Status: Normal (Preliminary result)   Collection Time   07/29/11  9:24 AM      Component Value Range Status Comment   Specimen Description SPUTUM EXPECTORATED   Final    Special Requests NONE   Final    ACID FAST SMEAR NO ACID FAST BACILLI SEEN   Final    Culture     Final    Value: CULTURE WILL BE EXAMINED FOR 6 WEEKS BEFORE ISSUING A FINAL REPORT   Report Status PENDING   Incomplete     Studies/Results: No results found.  Medications:  Scheduled:    . aspirin EC  81 mg Oral Daily  . atorvastatin  10 mg Oral q1800  . azithromycin  500 mg Oral q1800  . digoxin  0.25 mg Oral Daily  . digoxin  0.25 mg Oral Daily  . digoxin  0.5 mg Oral Daily  . diltiazem  240 mg Oral Daily  . enoxaparin  40 mg Subcutaneous Q24H  . guaiFENesin  600 mg Oral BID  . levalbuterol  0.63 mg Nebulization Q6H  . metoprolol tartrate  25 mg Oral BID  . piperacillin-tazobactam (ZOSYN)  IV  3.375 g Intravenous Q8H  . sodium chloride      . DISCONTD: digoxin  0.125 mg Oral Daily   Continuous:    . 0.9 % NaCl with KCl  20 mEq / L 10 mL/hr at 07/30/11 0600   WJX:[id number]  Assessment: Principal Problem:  *Acute respiratory failure with hypoxia Active Problems:  CAP (community acquired pneumonia)  Hypertension  Anxiety  Hyperglycemia  Elevated brain natriuretic peptide (BNP) level  Acute bronchitis  Lung nodules  Bulla of lung  Cavitary lesion of lung  Rapid atrial fibrillation   1. Abnormal CT scan. Noted are nodularity, bullous lesions/cavitary lesions, particularly in the right long. Also noted is extensive peribronchial thickening in the right lung. We're treating this as pneumonia and acute bronchitis. However, given his exposure to TB in the past, tuberculosis as a concern. Also, these CT findings may be secondary to her recent aspiration from mouthwash. CT was negative for PE. Antibiotics changed to Zosyn with continued azithromycin to cover anaerobes and atypical bacteria. AFB smears negative x 1. PPD negative.  Acute hypoxic respiratory failure, secondary to the above. He is being treated with oxygen supplementation. His oxygen saturations are somewhat better.  Elevated BNP with no evidence of pulmonary edema radiographically. He was given IV Lasix once. 2-D echocardiogram reveals preserved LV function.  (Newly diagnosed) Atrial fibrillation with rapid ventricular response. Apparently this is a new diagnosis. Status post Cardizem drip. Oral Cardizem titrated up. Digoxin started, and loaded by cardiology. Metoprolol started at a lower dose, with no increase in wheezing. Echocardiogram revealed EF of 65%.  Hyperglycemia. His hemoglobin A1c is 6.0. He may have prediabetes. This will just be followed.    Plan:  1. Sputum for AFB smears pending x 2. 2. Narrow antibiotic in the morning. 3. Home when ARF smears x 3 have been resulted.          LOS: 5 days   Adam Ward 07/31/2011, 1:33 PM

## 2011-08-01 ENCOUNTER — Encounter (HOSPITAL_COMMUNITY): Payer: Self-pay | Admitting: Internal Medicine

## 2011-08-01 DIAGNOSIS — J209 Acute bronchitis, unspecified: Secondary | ICD-10-CM

## 2011-08-01 DIAGNOSIS — R918 Other nonspecific abnormal finding of lung field: Secondary | ICD-10-CM

## 2011-08-01 DIAGNOSIS — J159 Unspecified bacterial pneumonia: Secondary | ICD-10-CM

## 2011-08-01 DIAGNOSIS — Z720 Tobacco use: Secondary | ICD-10-CM

## 2011-08-01 DIAGNOSIS — I4891 Unspecified atrial fibrillation: Secondary | ICD-10-CM

## 2011-08-01 HISTORY — DX: Tobacco use: Z72.0

## 2011-08-01 LAB — CBC
MCH: 30.4 pg (ref 26.0–34.0)
MCHC: 32 g/dL (ref 30.0–36.0)
MCV: 94.7 fL (ref 78.0–100.0)
Platelets: ADEQUATE 10*3/uL (ref 150–400)
RDW: 12.9 % (ref 11.5–15.5)

## 2011-08-01 LAB — CARDIAC PANEL(CRET KIN+CKTOT+MB+TROPI)
CK, MB: 1.9 ng/mL (ref 0.3–4.0)
Relative Index: INVALID (ref 0.0–2.5)
Troponin I: <0.3 ng/mL (ref <0.30)

## 2011-08-01 LAB — BASIC METABOLIC PANEL
BUN: 7 mg/dL (ref 6–23)
CO2: 33 mEq/L — ABNORMAL HIGH (ref 19–32)
Calcium: 8.5 mg/dL (ref 8.4–10.5)
Creatinine, Ser: 0.85 mg/dL (ref 0.50–1.35)
GFR calc non Af Amer: 84 mL/min — ABNORMAL LOW (ref >90)
Glucose, Bld: 100 mg/dL — ABNORMAL HIGH (ref 70–99)
Sodium: 133 mEq/L — ABNORMAL LOW (ref 135–145)

## 2011-08-01 MED ORDER — DIGOXIN 250 MCG PO TABS: 0.2500 mg | ORAL_TABLET | Freq: Every day | ORAL | Status: DC

## 2011-08-01 MED ORDER — LEVALBUTEROL TARTRATE 45 MCG/ACT IN AERO: 2.0000 | INHALATION_SPRAY | Freq: Three times a day (TID) | RESPIRATORY_TRACT | Status: DC

## 2011-08-01 MED ORDER — DILTIAZEM HCL ER COATED BEADS 240 MG PO CP24: 240.0000 mg | ORAL_CAPSULE | Freq: Every day | ORAL | Status: DC

## 2011-08-01 MED ORDER — AMOXICILLIN-POT CLAVULANATE 875-125 MG PO TABS: 1.0000 | ORAL_TABLET | Freq: Two times a day (BID) | ORAL | Status: DC

## 2011-08-01 MED ORDER — LEVALBUTEROL TARTRATE 45 MCG/ACT IN AERO
2.0000 | INHALATION_SPRAY | Freq: Three times a day (TID) | RESPIRATORY_TRACT | Status: DC
  Administered 2011-08-01: 2 via RESPIRATORY_TRACT
  Filled 2011-08-01: qty 15

## 2011-08-01 MED ORDER — METOPROLOL TARTRATE 25 MG PO TABS: 12.5000 mg | ORAL_TABLET | Freq: Two times a day (BID) | ORAL | Status: DC

## 2011-08-01 NOTE — Progress Notes (Signed)
Subjective: He says he is feeling better. He has no new complaints. He is coughing some but not very much. He's not coughing much of anything up. He has 2 negative AFB smears and we are awaiting the third.  Objective: Vital signs in last 24 hours: Temp:  [98 F (36.7 C)-98.3 F (36.8 C)] 98.3 F (36.8 C) (05/31 0619) Pulse Rate:  [79-98] 98  (05/31 0619) Resp:  [20] 20  (05/31 0619) BP: (94-108)/(58-65) 94/65 mmHg (05/31 0619) SpO2:  [91 %-97 %] 95 % (05/31 0730) Weight:  [65.09 kg (143 lb 8 oz)] 65.09 kg (143 lb 8 oz) (05/31 0824) Weight change:  Last BM Date: 07/31/11  Intake/Output from previous day: 05/30 0701 - 05/31 0700 In: 580 [P.O.:480; IV Piggyback:100] Out: 2150 [Urine:2150]  PHYSICAL EXAM General appearance: alert, cooperative and no distress Resp: rhonchi RLL, RML and RUL Cardio: irregularly irregular rhythm GI: soft, non-tender; bowel sounds normal; no masses,  no organomegaly Extremities: extremities normal, atraumatic, no cyanosis or edema  Lab Results:    Basic Metabolic Panel:  Basename 08/01/11 0517 07/30/11 0451  NA 133* 133*  K 3.9 3.8  CL 96 94*  CO2 33* 33*  GLUCOSE 100* 119*  BUN 7 5*  CREATININE 0.85 0.72  CALCIUM 8.5 8.5  MG -- --  PHOS -- --   Liver Function Tests: No results found for this basename: AST:2,ALT:2,ALKPHOS:2,BILITOT:2,PROT:2,ALBUMIN:2 in the last 72 hours No results found for this basename: LIPASE:2,AMYLASE:2 in the last 72 hours No results found for this basename: AMMONIA:2 in the last 72 hours CBC:  Basename 08/01/11 0517 07/30/11 0451  WBC 11.3* 10.9*  NEUTROABS -- --  HGB 13.3 13.2  HCT 41.5 39.5  MCV 94.7 92.3  PLT PLATELET CLUMPS NOTED ON SMEAR, COUNT APPEARS ADEQUATE 194   Cardiac Enzymes: No results found for this basename: CKTOTAL:3,CKMB:3,CKMBINDEX:3,TROPONINI:3 in the last 72 hours BNP: No results found for this basename: PROBNP:3 in the last 72 hours D-Dimer: No results found for this basename:  DDIMER:2 in the last 72 hours CBG:  Basename 08/01/11 0729 07/31/11 2054 07/31/11 1631 07/31/11 1127 07/31/11 0727 07/30/11 1612  GLUCAP 95 111* 98 102* 110* 98   Hemoglobin A1C: No results found for this basename: HGBA1C in the last 72 hours Fasting Lipid Panel: No results found for this basename: CHOL,HDL,LDLCALC,TRIG,CHOLHDL,LDLDIRECT in the last 72 hours Thyroid Function Tests: No results found for this basename: TSH,T4TOTAL,FREET4,T3FREE,THYROIDAB in the last 72 hours Anemia Panel: No results found for this basename: VITAMINB12,FOLATE,FERRITIN,TIBC,IRON,RETICCTPCT in the last 72 hours Coagulation: No results found for this basename: LABPROT:2,INR:2 in the last 72 hours Urine Drug Screen: Drugs of Abuse  No results found for this basename: labopia, cocainscrnur, labbenz, amphetmu, thcu, labbarb    Alcohol Level: No results found for this basename: ETH:2 in the last 72 hours Urinalysis: No results found for this basename: COLORURINE:2,APPERANCEUR:2,LABSPEC:2,PHURINE:2,GLUCOSEU:2,HGBUR:2,BILIRUBINUR:2,KETONESUR:2,PROTEINUR:2,UROBILINOGEN:2,NITRITE:2,LEUKOCYTESUR:2 in the last 72 hours Misc. Labs:  ABGS No results found for this basename: PHART,PCO2,PO2ART,TCO2,HCO3 in the last 72 hours CULTURES Recent Results (from the past 240 hour(s))  CULTURE, BLOOD (ROUTINE X 2)     Status: Normal   Collection Time   07/26/11  6:47 PM      Component Value Range Status Comment   Specimen Description BLOOD LEFT ARM DRAWN BY RN   Final    Special Requests BOTTLES DRAWN AEROBIC AND ANAEROBIC 4CC   Final    Culture NO GROWTH 5 DAYS   Final    Report Status 07/31/2011 FINAL   Final  CULTURE, BLOOD (ROUTINE X 2)     Status: Normal   Collection Time   07/26/11  6:49 PM      Component Value Range Status Comment   Specimen Description BLOOD LEFT ANTECUBITAL DRAWN BY RN   Final    Special Requests BOTTLES DRAWN AEROBIC ONLY 8CC   Final    Culture NO GROWTH 5 DAYS   Final    Report Status  07/31/2011 FINAL   Final   URINE CULTURE     Status: Normal   Collection Time   07/26/11  9:44 PM      Component Value Range Status Comment   Specimen Description URINE, CATHETERIZED   Final    Special Requests NONE   Final    Culture  Setup Time [id number]   Final    Colony Count NO GROWTH   Final    Culture NO GROWTH   Final    Report Status 07/28/2011 FINAL   Final   MRSA PCR SCREENING     Status: Normal   Collection Time   07/27/11 12:30 AM      Component Value Range Status Comment   MRSA by PCR NEGATIVE  NEGATIVE  Final   CULTURE, RESPIRATORY     Status: Normal   Collection Time   07/27/11 12:30 AM      Component Value Range Status Comment   Specimen Description SPU   Final    Special Requests NONE   Final    Gram Stain     Final    Value: NO WBC SEEN     FEW SQUAMOUS EPITHELIAL CELLS PRESENT     RARE GRAM POSITIVE COCCI IN PAIRS     RARE GRAM POSITIVE RODS     RARE GRAM NEGATIVE RODS   Culture NORMAL OROPHARYNGEAL FLORA   Final    Report Status 07/29/2011 FINAL   Final   AFB CULTURE WITH SMEAR     Status: Normal (Preliminary result)   Collection Time   07/29/11  9:24 AM      Component Value Range Status Comment   Specimen Description SPUTUM EXPECTORATED   Final    Special Requests NONE   Final    ACID FAST SMEAR NO ACID FAST BACILLI SEEN   Final    Culture     Final    Value: CULTURE WILL BE EXAMINED FOR 6 WEEKS BEFORE ISSUING A FINAL REPORT   Report Status PENDING   Incomplete   AFB CULTURE WITH SMEAR     Status: Normal (Preliminary result)   Collection Time   07/30/11  7:52 AM      Component Value Range Status Comment   Specimen Description LUNG   Final    Special Requests Normal   Final    ACID FAST SMEAR NO ACID FAST BACILLI SEEN   Final    Culture     Final    Value: CULTURE WILL BE EXAMINED FOR 6 WEEKS BEFORE ISSUING A FINAL REPORT   Report Status PENDING   Incomplete    Studies/Results: No results found.  Medications:  Scheduled:   . aspirin EC  81  mg Oral Daily  . atorvastatin  10 mg Oral q1800  . azithromycin  500 mg Oral q1800  . digoxin  0.25 mg Oral Daily  . diltiazem  240 mg Oral Daily  . enoxaparin  40 mg Subcutaneous Q24H  . guaiFENesin  600 mg Oral BID  . levalbuterol  0.63 mg Nebulization Q6H  .  metoprolol tartrate  12.5 mg Oral BID  . piperacillin-tazobactam (ZOSYN)  IV  3.375 g Intravenous Q8H  . DISCONTD: metoprolol tartrate  25 mg Oral BID   Continuous:   . 0.9 % NaCl with KCl 20 mEq / L 10 mL/hr at 07/30/11 0600   ZOX:[id number]  Assesment: He has problems as listed below. I think he may well have aspirated. I have discussed the situation with Dr. Sherrie Mustache this morning and she is hopeful of sending him home if he has a third negative AFB smear. He will be covered with antibiotics for bacteria with anaerobic coverage since it seems he may have aspirated. He has atrial fibrillation which seems to be better controlled Principal Problem:  *Acute respiratory failure with hypoxia Active Problems:  CAP (community acquired pneumonia)  Hypertension  Anxiety  Hyperglycemia  Elevated brain natriuretic peptide (BNP) level  Acute bronchitis  Lung nodules  Bulla of lung  Cavitary lesion of lung  Rapid atrial fibrillation    Plan: No change in treatment I will plan to follow more peripherally    LOS: 6 days   Versie Fleener L 08/01/2011, 8:31 AM

## 2011-08-01 NOTE — Discharge Summary (Signed)
Physician Discharge Summary  Adam Ward MRN: [id number] DOB/AGE: [birth year] 76 y.o.  PCP: No primary provider on file.   Admit date: 07/26/2011 Discharge date: 08/01/2011  Discharge Diagnoses:  1. Right lung nodular opacities with occasional bullae/possible cavitary lesions, with pneumonia. AFB smears x3 negative. AFB SPUTUM CULTURES ARE PENDING AND WILL NEED TO BE FOLLOWED IN THE OUTPATIENT SETTING. Sputum culture negative. Etiology possibly aspiration as the patient may have aspirated mouthwash a week or 2 prior to admission. FOLLOWUP CT SCAN OF THE CHEST IS RECOMMENDED IN 2-3 MONTHS. 2. Newly diagnosed hypoxic respiratory failure. The patient was discharged to home on 3 L of nasal cannula oxygen for an oxygen saturation of 85% on the date of discharge. 2. Acute bronchitis/COPD. 3. Newly diagnosed/new onset paroxysmal atrial fibrillation with rapid ventricular response. Thought to be precipitated by pneumonia/pulmonary process.  4. Tobacco abuse. The patient was advised to stop permanently. 5. Elevated pro BNP, but no clinical findings significant for congestive heart failure. Per cardiologist, Dr. Diona Browner, the patient's ejection fraction was 60-65% with mild TR and mild pulmonary hypertension per 2-D echocardiogram on 07/29/2011. 6. Mild hyperglycemia. The patient's hemoglobin A1c was 6.0. His venous glucose will need to be monitored in the outpatient setting. 7. Chronic debility.    Medication List  As of 08/01/2011  4:27 PM   STOP taking these medications         metoprolol 50 MG tablet         TAKE these medications         alprazolam 2 MG tablet   Commonly known as: XANAX   Take 2 mg by mouth 3 (three) times daily.      amoxicillin-clavulanate 875-125 MG per tablet   Commonly known as: AUGMENTIN   Take 1 tablet by mouth 2 (two) times daily. Antibiotic to take for 7 more days.      aspirin EC 81 MG tablet   Take 81 mg by mouth daily.      digoxin 0.25 MG tablet     Commonly known as: LANOXIN   Take 1 tablet (0.25 mg total) by mouth daily. Medications for your heart rate.      diltiazem 240 MG 24 hr capsule   Commonly known as: CARDIZEM CD   Take 1 capsule (240 mg total) by mouth daily. Medication for your blood pressure and her heart rate.      Fish Oil 1000 MG Caps   Take 1 capsule by mouth daily.      levalbuterol 45 MCG/ACT inhaler   Commonly known as: XOPENEX HFA   Inhale 2 puffs into the lungs 3 (three) times daily.      multivitamin with minerals tablet   Take 1 tablet by mouth daily.      simvastatin 20 MG tablet   Commonly known as: ZOCOR   Take 20 mg by mouth daily.            Discharge Condition: Improved.  Disposition:  home.   Consults: Kari Baars, M.D.; and cardiologists Dr. Diona Browner and Dr. Dietrich Pates.   Significant Diagnostic Studies: Ct Angio Chest W/cm &/or Wo Cm  07/27/2011  *RADIOLOGY REPORT*  Clinical Data: 76 year old with hypoxia and elevated D-dimer.  CT ANGIOGRAPHY CHEST  Technique:  Multidetector CT imaging of the chest using the standard protocol during bolus administration of intravenous contrast. Multiplanar reconstructed images including MIPs were obtained and reviewed to evaluate the vascular anatomy.  Contrast: OMNIPAQUE IOHEXOL 350 MG/ML SOLN  Comparison:  Chest radiograph 07/27/2011  Findings: There is no evidence for pulmonary embolism.  There is edema in the upper abdomen, particularly near the kidneys.  There is diffuse atherosclerotic disease involving the thoracic aorta but no evidence for a dissection.  There is a small amount of pleural thickening or fluid bilaterally.  Prominent prevascular lymph node measuring 0.9 cm on sequence #4, image 39.  Precarinal lymph node measures 0.9 cm in the short axis.  Subcarinal lymph node measures 1.5 cm on image 47.  The trachea and mainstem bronchi are patent. There is diffuse peribronchial thickening but this is most prominent in the right lower lobe and  right middle lobe.  In fact, there is narrowing of the right lower lobe bronchus.  Diffuse interstitial thickening and parenchymal nodularity in the right lower lobe.  Subtle small nodular densities in the lower right upper lobe.  Emphysematous changes with irregular cavitary or bullous lesions in the right upper lobe.  Questionable 7 mm nodule in the right upper lobe on sequence five, image 35.  There appears to be scarring at the left lung apex.  There are pleural-based densities along the dependent aspect of the left lower lobe which is indeterminate but could be related to atelectasis.  Mild peripheral nodularity in the left lower lobe.  No acute bony abnormality.  IMPRESSION: Negative for acute pulmonary embolism.  Diffuse peribronchial thickening, particularly in the right lower lobe where there is narrowing of the right lower lobe bronchus. There is diffuse interstitial and nodular disease in the right lower lung.  Findings are suspicious for an infectious etiology. There are similar more subtle findings in the right lower lobe and left lower lobe.  There are scattered irregular opacities with bulla/cavitary formations throughout the right upper lung.  There is mild mediastinal lymphadenopathy, particularly in the subcarinal station.  Recommend follow-up Chest CT within 2-3 months to evaluate the irregular opacities and possible cavitary lesions in the right upper lung.  In addition, the chest lymphadenopathy should be followed.  Original Report Authenticated By: Richarda Overlie, M.D.   Dg Chest Port 1 View  07/27/2011  *RADIOLOGY REPORT*  Clinical Data: Evaluate pneumonia.  PORTABLE CHEST - 1 VIEW  Comparison: 07/26/2011 01/17/2009  Findings: There is slight respiratory motion at the lung bases. Underlying changes of emphysema are noted.  Probable stable scarring laterally in the right upper lobe, similar dating back to 2010.  There is a developing faint opacity at the right lung base, suspicious for pneumonia.   The left lung is clear.  No visible pneumothorax or pleural effusion.  Postsurgical changes of the lower cervical spine.  No acute bony abnormality.  IMPRESSION: Developing right basilar opacity suspicious for pneumonia. Emphysema.  Original Report Authenticated By: Britta Mccreedy, M.D.   Dg Chest Portable 1 View  07/26/2011  *RADIOLOGY REPORT*  Clinical Data: Sepsis.  Weakness.  PORTABLE CHEST - 1 VIEW  Comparison: 01/17/2009  Findings: Changes of COPD again demonstrated.  Right upper lobe scarring remains stable.  No evidence of acute infiltrate or edema. No evidence of pleural effusion.  Heart size and mediastinal contours are normal.  IMPRESSION: Stable COPD.  No active disease.  Original Report Authenticated By: Danae Orleans, M.D.   ECHO: Ejection fraction 60%. Trivial TR. Mild pulmonary hypertension, per the report by Dr. Diona Browner.   Microbiology: Recent Results (from the past 240 hour(s))  CULTURE, BLOOD (ROUTINE X 2)     Status: Normal   Collection Time   07/26/11  6:47 PM  Component Value Range Status Comment   Specimen Description BLOOD LEFT ARM DRAWN BY RN   Final    Special Requests BOTTLES DRAWN AEROBIC AND ANAEROBIC 4CC   Final    Culture NO GROWTH 5 DAYS   Final    Report Status 07/31/2011 FINAL   Final   CULTURE, BLOOD (ROUTINE X 2)     Status: Normal   Collection Time   07/26/11  6:49 PM      Component Value Range Status Comment   Specimen Description BLOOD LEFT ANTECUBITAL DRAWN BY RN   Final    Special Requests BOTTLES DRAWN AEROBIC ONLY 8CC   Final    Culture NO GROWTH 5 DAYS   Final    Report Status 07/31/2011 FINAL   Final   URINE CULTURE     Status: Normal   Collection Time   07/26/11  9:44 PM      Component Value Range Status Comment   Specimen Description URINE, CATHETERIZED   Final    Special Requests NONE   Final    Culture  Setup Time [id number]   Final    Colony Count NO GROWTH   Final    Culture NO GROWTH   Final    Report Status 07/28/2011 FINAL    Final   MRSA PCR SCREENING     Status: Normal   Collection Time   07/27/11 12:30 AM      Component Value Range Status Comment   MRSA by PCR NEGATIVE  NEGATIVE  Final   CULTURE, RESPIRATORY     Status: Normal   Collection Time   07/27/11 12:30 AM      Component Value Range Status Comment   Specimen Description SPU   Final    Special Requests NONE   Final    Gram Stain     Final    Value: NO WBC SEEN     FEW SQUAMOUS EPITHELIAL CELLS PRESENT     RARE GRAM POSITIVE COCCI IN PAIRS     RARE GRAM POSITIVE RODS     RARE GRAM NEGATIVE RODS   Culture NORMAL OROPHARYNGEAL FLORA   Final    Report Status 07/29/2011 FINAL   Final   AFB CULTURE WITH SMEAR     Status: Normal (Preliminary result)   Collection Time   07/29/11  9:24 AM      Component Value Range Status Comment   Specimen Description SPUTUM EXPECTORATED   Final    Special Requests NONE   Final    ACID FAST SMEAR NO ACID FAST BACILLI SEEN   Final    Culture     Final    Value: CULTURE WILL BE EXAMINED FOR 6 WEEKS BEFORE ISSUING A FINAL REPORT   Report Status PENDING   Incomplete   AFB CULTURE WITH SMEAR     Status: Normal (Preliminary result)   Collection Time   07/30/11  7:52 AM      Component Value Range Status Comment   Specimen Description LUNG   Final    Special Requests Normal   Final    ACID FAST SMEAR NO ACID FAST BACILLI SEEN   Final    Culture     Final    Value: CULTURE WILL BE EXAMINED FOR 6 WEEKS BEFORE ISSUING A FINAL REPORT   Report Status PENDING   Incomplete   AFB CULTURE WITH SMEAR     Status: Normal (Preliminary result)   Collection Time   07/31/11  5:23 AM  Component Value Range Status Comment   Specimen Description SPUTUM EXPECTORATED   Final    Special Requests NONE   Final    ACID FAST SMEAR NO ACID FAST BACILLI SEEN   Final    Culture     Final    Value: CULTURE WILL BE EXAMINED FOR 6 WEEKS BEFORE ISSUING A FINAL REPORT   Report Status PENDING   Incomplete      Labs: Results for orders placed  during the hospital encounter of 07/26/11 (from the past 48 hour(s))  AFB CULTURE WITH SMEAR     Status: Normal (Preliminary result)   Collection Time   07/31/11  5:23 AM      Component Value Range Comment   Specimen Description SPUTUM EXPECTORATED      Special Requests NONE      ACID FAST SMEAR NO ACID FAST BACILLI SEEN      Culture        Value: CULTURE WILL BE EXAMINED FOR 6 WEEKS BEFORE ISSUING A FINAL REPORT   Report Status PENDING     GLUCOSE, CAPILLARY     Status: Abnormal   Collection Time   07/31/11  7:27 AM      Component Value Range Comment   Glucose-Capillary 110 (*) 70 - 99 (mg/dL)    Comment 1 Notify RN     GLUCOSE, CAPILLARY     Status: Abnormal   Collection Time   07/31/11 11:27 AM      Component Value Range Comment   Glucose-Capillary 102 (*) 70 - 99 (mg/dL)   GLUCOSE, CAPILLARY     Status: Normal   Collection Time   07/31/11  4:31 PM      Component Value Range Comment   Glucose-Capillary 98  70 - 99 (mg/dL)   GLUCOSE, CAPILLARY     Status: Abnormal   Collection Time   07/31/11  8:54 PM      Component Value Range Comment   Glucose-Capillary 111 (*) 70 - 99 (mg/dL)    Comment 1 Documented in Chart      Comment 2 Notify RN     BASIC METABOLIC PANEL     Status: Abnormal   Collection Time   08/01/11  5:17 AM      Component Value Range Comment   Sodium 133 (*) 135 - 145 (mEq/L)    Potassium 3.9  3.5 - 5.1 (mEq/L)    Chloride 96  96 - 112 (mEq/L)    CO2 33 (*) 19 - 32 (mEq/L)    Glucose, Bld 100 (*) 70 - 99 (mg/dL)    BUN 7  6 - 23 (mg/dL)    Creatinine, Ser 0.96  0.50 - 1.35 (mg/dL)    Calcium 8.5  8.4 - 10.5 (mg/dL)    GFR calc non Af Amer 84 (*) >90 (mL/min)    GFR calc Af Amer >90  >90 (mL/min)   CBC     Status: Abnormal   Collection Time   08/01/11  5:17 AM      Component Value Range Comment   WBC 11.3 (*) 4.0 - 10.5 (K/uL)    RBC 4.38  4.22 - 5.81 (MIL/uL)    Hemoglobin 13.3  13.0 - 17.0 (g/dL)    HCT 04.5  40.9 - 81.1 (%)    MCV 94.7  78.0 - 100.0  (fL)    MCH 30.4  26.0 - 34.0 (pg)    MCHC 32.0  30.0 - 36.0 (g/dL)    RDW 12.9  11.5 - 15.5 (%)    Platelets    150 - 400 (K/uL)    Value: PLATELET CLUMPS NOTED ON SMEAR, COUNT APPEARS ADEQUATE  GLUCOSE, CAPILLARY     Status: Normal   Collection Time   08/01/11  7:29 AM      Component Value Range Comment   Glucose-Capillary 95  70 - 99 (mg/dL)   GLUCOSE, CAPILLARY     Status: Abnormal   Collection Time   08/01/11 10:57 AM      Component Value Range Comment   Glucose-Capillary 161 (*) 70 - 99 (mg/dL)   CARDIAC PANEL(CRET KIN+CKTOT+MB+TROPI)     Status: Normal   Collection Time   08/01/11  3:30 PM      Component Value Range Comment   Total CK 20  7 - 232 (U/L)    CK, MB 1.9  0.3 - 4.0 (ng/mL)    Troponin I <0.30  <0.30 (ng/mL)    Relative Index RELATIVE INDEX IS INVALID  0.0 - 2.5       HPI : The patient is a 76 year old man with a history significant for hypertension, chronic anxiety, and tobacco abuse, who presented to the emergency department on 07/26/2011 with a chief complaint of shortness of breath. On arrival to the emergency department, he was noted to be hypoxic with an oxygen saturation ranging in the 60s on room air. His chest x-ray revealed COPD and stable right upper lobe scarring. He was febrile with a temperature 100.2. He was tachycardic with a heart rate ranging from 121-133. His EKG revealed sinus tachycardia with premature atrial contractions and with a heart rate of 122 beats per minute. His white blood cell count was 11.4. His venous glucose was 165. His proBNP was 2495. His lactic acid was within normal limits at 1.5. His procalcitonin level was less than 0.10. He was admitted for further evaluation and management.  HOSPITAL COURSE: The patient was admitted to the ICU for further monitoring. Oxygen was applied to keep his oxygen saturations greater than 90%. Antibiotic therapy was started empirically for pneumonia, although, his chest x-ray did not reveal infiltrates.  Rocephin and azithromycin were started IV. He received Solu-Medrol in the emergency department. At the time of Dr. Kermit Balo assessment, the patient was not wheezing, and therefore, steroid therapy was not continued. Bronchodilator therapy was started with albuterol nebulizers. The patient has a history of smoking daily. He was strongly advised to stop smoking forever. He was very receptive initially. Because of the unclear etiology of his hypoxia, it was believed that he may have had an element of congestive heart failure because of his elevated pro BNP. Therefore, he was given 1 dose of IV Lasix. A d-dimer was ordered to assess for PE. It was elevated. CT angiogram of his chest was ordered for evaluation. It was negative for PE, however, it revealed significant abnormalities primarily in the right lung suggestive of nodular/cavitary pneumonia and pneumonia. Rocephin was discontinued and Zosyn was started for broader spectrum. Azithromycin was continued. Pulmonologist, Dr. Juanetta Gosling was consulted for a possible diagnostic bronchoscopy.  Prior to Dr. Juanetta Gosling consultation and assessment, the patient reported that he had a choking spell when he was gargling mouthwash. He stated that some of the mouthwash may have been aspirated into his long at the time. He also reported a former neighbor who had tuberculosis in the 1940s. For this reason, the patient was placed in airborne isolation. A PPD was placed. An order for AFB smears and cultures was initiated.  Following Dr. Juanetta Gosling assessment, he believed that the patient did not need a bronchoscopy. He believes that the patient's symptomatology was likely secondary to aspiration pneumonia, but he agreed with ruling out TB.  The patient became more tachycardic. His followup EKG revealed atrial fibrillation with rapid ventricular rate. IV heparin was started empirically. Cardizem infusion was started as well. For further evaluation, a number of studies were ordered. His  cardiac enzymes were within normal limits. His TSH was within normal limits at 1.6. Although it took a few days for the echocardiogram results to become available, the patient was noted to have preserved LV function with trivial TR and mild pulmonary hypertension. Heparin was discontinued. The Cardizem drip was discontinued in favor of oral Cardizem. Metoprolol was restarted as it did not appear that the oral Cardizem was controlling his rate. However, when the metoprolol was restarted, the patient began wheezing again. Therefore, digoxin was started. Eventually, cardiology was consulted. Both Dr. Diona Browner and Dr. Dietrich Pates evaluated the patient and made recommendations. Dr. Dietrich Pates loaded the patient with digoxin and increased the maintenance dose to 0.25 mg daily. Metoprolol was eventually discontinued. Anticoagulation was considered as the patient's CHAD's score was 2. However, in the setting of an acute severe pulmonary infection, it was decided that he would be treated with aspirin until his infection was completely resolved. The patient's rhythm actually converted to normal sinus on the day of discharge.  The patient was noted to be hyperglycemic on admission. His hemoglobin A1c was 6.0. The hyperglycemia may have been secondary to IV Solu-Medrol that was given in the emergency department. His venous glucose will need to be monitored in the outpatient setting.  The patient improved clinically and symptomatically. His PPD was negative. All 3 AFB smears were negative. His blood cultures remained negative. He remained hypoxic but not as hypoxic as he was on admission. His oxygen saturation on room air prior to discharge was 85%. He was prescribed home oxygen at 3 L per minute. Before discharge, he was strongly advised to stop smoking. Home health physical therapy and the registered nurse were ordered for further monitoring and strengthening. The patient was afebrile and hemodynamically stable at the time of  discharge. He was informed that he would need another CT scan of his chest in 2-3 months. His wife was informed as well. They were advised to discuss this with his primary care physician.    Discharge Exam: Blood pressure 132/101, pulse 86, temperature 97.7 F (36.5 C), temperature source Oral, resp. rate 20, height 6\' 1"  (1.854 m), weight 65.09 kg (143 lb 8 oz), SpO2 93.00%.  Lungs: Occasional wheezes and crackles, significantly decreased. Heart: S1, S2, with a soft systolic murmur. Abdomen: Positive bowel sounds, soft, nontender, nondistended. Extremities: No pedal edema.   Discharge Orders    Future Appointments: Provider: Department: Dept Phone: Center:   08/12/2011 2:20 PM Jodelle Gross, NP [facility] [phone] [id number]     Future Orders Please Complete By Expires   Diet - low sodium heart healthy      Diet general      Increase activity slowly      Discharge instructions      Comments:   YOU WILL NEED TO HAVE A FOLLOWUP CT SCAN OF YOUR CHEST IN 2-3 MONTHS. THIS CAN BE ARRANGED BY YOUR PRIMARY CARE DOCTOR. YOU SHOULD WEAR YOUR OXYGEN AS CLOSE TO 24 HOURS DAILY AS POSSIBLE.      Follow-up Information    Follow up with Joni Reining, NP  on 08/12/2011. (2:20 [place] office)    Contact information:   [address] [place] [address] [phone]       Follow up with Colette Ribas, MD on 08/06/2011. (11am)    Contact information:   [address] [place] [address] [place] [address] [phone]       Follow up with [facility] [phone].         Total discharge time: 45 minutes.   Signed: Broox Lonigro 08/01/2011, 4:27 PM

## 2011-08-01 NOTE — Progress Notes (Signed)
o2 sats 85% after being off of oxygen for 20 minutes

## 2011-08-01 NOTE — Progress Notes (Signed)
Physical Therapy Treatment Patient Details Name: Adam Ward MRN: [id number] DOB: 04/29/1935 Today's Date: 08/01/2011 Time:  4098-1191  PT Assessment / Plan / Recommendation Comments on Treatment Session  Pt tolerated well towards total treatment.  Able to ambulate with RW 100 ft within room with superivision and cueing to increase stride length and posture.  Pt did not want to leave room so stairs were not complete this session.    Follow Up Recommendations       Barriers to Discharge        Equipment Recommendations       Recommendations for Other Services    Frequency     Plan      Precautions / Restrictions Precautions Precautions: Fall Restrictions Weight Bearing Restrictions: No       Mobility  Bed Mobility Supine to Sit: 7: Independent Transfers Sit to Stand: 5: Supervision;Other (comment) (vc-ing for hand placement for safety) Stand to Sit: 5: Supervision (vc-ing for hand placement) Ambulation/Gait Ambulation Distance (Feet): 100 Feet (5 RT in room, pt not wanting to go out in hallway) Assistive device: Rolling walker Gait Pattern: Trunk flexed;Decreased stride length Gait velocity: slow Wheelchair Mobility Wheelchair Mobility: No    Exercises Total Joint Exercises Ankle Circles/Pumps: AROM;10 reps;Both;Supine Short Arc Quad: AROM;Both;15 reps;Supine Straight Leg Raises: AROM;Both;10 reps;Supine Long Arc Quad: AROM;Both;15 reps;Seated General Exercises - Upper Extremity Shoulder Flexion: AROM;Both;10 reps;Supine Shoulder ABduction: AROM;Both;10 reps;Supine Shoulder Horizontal ABduction: AROM;Both;10 reps;Supine Shoulder Horizontal ADduction: AROM;Both;10 reps;Supine   PT Diagnosis:    PT Problem List:   PT Treatment Interventions:     PT Goals Acute Rehab PT Goals PT Goal: Sit to Stand - Progress: Met PT Goal: Ambulate - Progress: Met PT Goal: Up/Down Stairs - Progress: Not met  Visit Information       Subjective Data  Subjective: Pt stated  he is tired, not wanting to go out in hallway but agreed to ambulate within room.  No c/o pain.   Cognition       Balance     End of Session PT - End of Session Equipment Utilized During Treatment: Gait belt Activity Tolerance: Patient tolerated treatment well Patient left: in chair;with call bell/phone within reach    Juel Burrow 08/01/2011, 1:48 PM

## 2011-08-01 NOTE — Progress Notes (Addendum)
SUBJECTIVE:Continues weak with coughing, but less productive now. Breathing status is essentially the same.  Modest activity walking in room, but he does not do much more at home.   *Acute respiratory failure with hypoxia  CAP (community acquired pneumonia)  Hypertension  Elevated brain natriuretic peptide (BNP) level Paroxysmal atrial fibrillation  LABS: Basic Metabolic Panel:  Basename 08/01/11 0517 07/30/11 0451  NA 133* 133*  K 3.9 3.8  CL 96 94*  CO2 33* 33*  GLUCOSE 100* 119*  BUN 7 5*  CREATININE 0.85 0.72  CALCIUM 8.5 8.5  MG -- --  PHOS -- --   CBC:  Basename 08/01/11 0517 07/30/11 0451  WBC 11.3* 10.9*  NEUTROABS -- --  HGB 13.3 13.2  HCT 41.5 39.5  MCV 94.7 92.3  PLT PLATELET CLUMPS NOTED ON SMEAR, COUNT APPEARS ADEQUATE 194  PHYSICAL EXAM BP 94/65  Pulse 98  Temp(Src) 98.3 F (36.8 C) (Oral)  Resp 20  Ht 6\' 1"  (1.854 m)  Wt 143 lb 8 oz (65.09 kg)  BMI 18.93 kg/m2  SpO2 95% General: Well developed, well nourished, in no acute distress Head: Eyes PERRLA, No xanthomas.   Normal cephalic and atramatic  Lungs: Mild inspiratory and expiratory wheezes. Frequent coughing; markedly decreased breath sounds; prolonged expiratory phase Heart: HRRR S1 S2, No MRG Pulses are 2+ & equal.            No carotid bruit. No JVD.  No abdominal bruits. No             femoral bruits. Abdomen: Bowel sounds are positive, abdomen soft and non-tender without masses  Msk:  Diminshed strength and tone for age. Extremities: No clubbing, cyanosis or edema.  DP +1 Neuro: Alert and oriented X 3. Psych:  Good affect, responds appropriately  TELEMETRY: Reviewed telemetry pt in: NSR converted at 3:30am.   EKG: Sinus rhythm with PAC.   ASSESSMENT AND PLAN:  1. PAF: Thought to be related to acute illness. He has converted to NSR on cardizem and digoxin.  Wheezing improved but still present even though lopressor has been titrated to lowest dosing. I will therefore discontinue  metoprolol. No anticoagulation is planned at this time. CHADs score is 2, which would generally suggest the need for chronic anticoagulation; however,since this was a fairly brief and self-limited episode during a bout of pneumonia, we will treat with ASA only for now. Continue digoxin and CCB. We have made a return office appointment  for August 12, 2011 for follow-up.   2, COPD with pneumonia: Improving daily with negative TB skin test and AFB smear negative X 2. Discharge is planned for today with continuation of antibiotics as per Dr. Juanetta Gosling note.   Bettey Mare. [name] NP [facility] 08/01/2011, 10:45 AM  Cardiology Attending Patient interviewed and examined. Discussed with Joni Reining, NP.  Above note annotated and modified based upon my findings.  Patient has converted to sinus rhythm, and hopefully atrial fibrillation will not recur unless he experiences another physiologic insult.  Diltiazem and digoxin will be continued to prevent excessive tachycardia should his atrial arrhythmia recur.  Anticoagulation would convey a significant risk in this debilitated gentleman who has a significant risk of sustaining a fall.  Aspirin is adequate therapy for now.  Agree with plans for discharge.  [place] Bing, MD 08/01/2011, 4:52 PM

## 2011-08-01 NOTE — Progress Notes (Signed)
NAME:  CAESON, FILIPPI NO.:  [id number]  MEDICAL RECORD NO.:  [id number]  LOCATION:  A341                          FACILITY:  [facility]  PHYSICIAN:  Arin Vanosdol L. Juanetta Gosling, M.D.DATE OF BIRTH:  Jul 29, 1935  DATE OF PROCEDURE:  07/31/2011 DATE OF DISCHARGE:                                PROGRESS NOTE   Mr. Adam Ward is a patient of the [facility] who is admitted with hypoxia and was found on CT scan to have significant changes mostly in the right lung.  There is concern that perhaps he aspirated.  He did not have anything that looks like a carcinoma but had multiple areas of what appeared to be infection.  He had some cavitary lesions and is being worked up to be sure he does not have tuberculosis since he does have an exposure.  His exam today shows that his temperature is 98, pulse 79, respirations 20, blood pressure 108/58.  Chest is clearer than it has been.  He looks more comfortable.  I cannot tell from his examination whether he is in atrial fibrillation or not but he did develop atrial fibrillation.  He has had 2 negative AFB smears now.  My assessment then is that he has what I think is chronic obstructive pulmonary disease and pneumonia.  He developed atrial fibrillation.  He is improving.  He should be able to be discharged home to finish his antibiotics as an outpatient once we have the 3rd AFB smear.     Jahdiel Krol L. Juanetta Gosling, M.D.     ELH/MEDQ  D:  08/01/2011  T:  08/01/2011  Job:  [id number]

## 2011-08-04 DIAGNOSIS — Z9981 Dependence on supplemental oxygen: Secondary | ICD-10-CM | POA: Diagnosis not present

## 2011-08-04 DIAGNOSIS — I10 Essential (primary) hypertension: Secondary | ICD-10-CM | POA: Diagnosis not present

## 2011-08-04 DIAGNOSIS — J189 Pneumonia, unspecified organism: Secondary | ICD-10-CM | POA: Diagnosis not present

## 2011-08-04 DIAGNOSIS — I4891 Unspecified atrial fibrillation: Secondary | ICD-10-CM | POA: Diagnosis not present

## 2011-08-04 DIAGNOSIS — Z85038 Personal history of other malignant neoplasm of large intestine: Secondary | ICD-10-CM | POA: Diagnosis not present

## 2011-08-04 DIAGNOSIS — J449 Chronic obstructive pulmonary disease, unspecified: Secondary | ICD-10-CM | POA: Diagnosis not present

## 2011-08-06 DIAGNOSIS — I4891 Unspecified atrial fibrillation: Secondary | ICD-10-CM | POA: Diagnosis not present

## 2011-08-06 DIAGNOSIS — J449 Chronic obstructive pulmonary disease, unspecified: Secondary | ICD-10-CM | POA: Diagnosis not present

## 2011-08-06 DIAGNOSIS — IMO0002 Reserved for concepts with insufficient information to code with codable children: Secondary | ICD-10-CM | POA: Diagnosis not present

## 2011-08-06 DIAGNOSIS — J189 Pneumonia, unspecified organism: Secondary | ICD-10-CM | POA: Diagnosis not present

## 2011-08-08 DIAGNOSIS — J449 Chronic obstructive pulmonary disease, unspecified: Secondary | ICD-10-CM | POA: Diagnosis not present

## 2011-08-08 DIAGNOSIS — Z85038 Personal history of other malignant neoplasm of large intestine: Secondary | ICD-10-CM | POA: Diagnosis not present

## 2011-08-08 DIAGNOSIS — Z9981 Dependence on supplemental oxygen: Secondary | ICD-10-CM | POA: Diagnosis not present

## 2011-08-08 DIAGNOSIS — I10 Essential (primary) hypertension: Secondary | ICD-10-CM | POA: Diagnosis not present

## 2011-08-08 DIAGNOSIS — J189 Pneumonia, unspecified organism: Secondary | ICD-10-CM | POA: Diagnosis not present

## 2011-08-08 DIAGNOSIS — I4891 Unspecified atrial fibrillation: Secondary | ICD-10-CM | POA: Diagnosis not present

## 2011-08-12 ENCOUNTER — Encounter: Payer: Self-pay | Admitting: Adult Health

## 2011-08-12 ENCOUNTER — Ambulatory Visit (INDEPENDENT_AMBULATORY_CARE_PROVIDER_SITE_OTHER): Payer: Medicare Other | Admitting: Adult Health

## 2011-08-12 VITALS — BP 140/74 | HR 74 | Ht 73.0 in | Wt 130.0 lb

## 2011-08-12 DIAGNOSIS — I4891 Unspecified atrial fibrillation: Secondary | ICD-10-CM | POA: Diagnosis not present

## 2011-08-12 DIAGNOSIS — I10 Essential (primary) hypertension: Secondary | ICD-10-CM

## 2011-08-12 MED ORDER — DILTIAZEM HCL ER COATED BEADS 240 MG PO CP24: 240.0000 mg | ORAL_CAPSULE | Freq: Every day | ORAL | Status: DC

## 2011-08-12 MED ORDER — DIGOXIN 250 MCG PO TABS: 0.2500 mg | ORAL_TABLET | Freq: Every day | ORAL | Status: DC

## 2011-08-12 NOTE — Assessment & Plan Note (Signed)
Blood pressure is moderately controlled at present. He is tolerating medications without dizziness or fatigue. Continue as directed.

## 2011-08-12 NOTE — Patient Instructions (Signed)
Your physician recommends that you schedule a follow-up appointment in: 4 months  

## 2011-08-12 NOTE — Progress Notes (Signed)
HPI: Adam Ward is a 76 y/o patient we are seeing on hospital follow-up after admission for pneumonia, hypoxic respiratory failure, CHF and newly diagnosed PAF. He was converted to NSR during hospitalization and it was felt that the Afib was related to acute illness. He was not placed on anticoagulation, but was placed on ASA daily. He was also placed on diltiazem and digoxin. He has finished up his antibiotics and states he is back to his usual self. However, he is now O2 dependent via [place] at 2/L. He denies palpitations, dizziness or chest discomfort.  Allergies  Allergen Reactions  . Celebrex (Celecoxib)   . Tetracyclines & Related Hives    Current Outpatient Prescriptions  Medication Sig Dispense Refill  . alprazolam (XANAX) 2 MG tablet Take 2 mg by mouth 3 (three) times daily.      Marland Kitchen aspirin EC 81 MG tablet Take 81 mg by mouth daily.      . digoxin (LANOXIN) 0.25 MG tablet Take 1 tablet (0.25 mg total) by mouth daily.  90 tablet  6  . diltiazem (CARDIZEM CD) 240 MG 24 hr capsule Take 1 capsule (240 mg total) by mouth daily. Medication for your blood pressure and her heart rate.  90 capsule  6  . levalbuterol (XOPENEX HFA) 45 MCG/ACT inhaler Inhale 2 puffs into the lungs 3 (three) times daily.  1 Inhaler  3  . Multiple Vitamins-Minerals (MULTIVITAMIN WITH MINERALS) tablet Take 1 tablet by mouth daily.      . Omega-3 Fatty Acids (FISH OIL) 1000 MG CAPS Take 1 capsule by mouth daily.      . simvastatin (ZOCOR) 20 MG tablet Take 20 mg by mouth daily.      Marland Kitchen DISCONTD: digoxin (LANOXIN) 0.25 MG tablet Take 1 tablet (0.25 mg total) by mouth daily. Medications for your heart rate.  30 tablet  3  . DISCONTD: diltiazem (CARDIZEM CD) 240 MG 24 hr capsule Take 1 capsule (240 mg total) by mouth daily. Medication for your blood pressure and her heart rate.  30 capsule  3    Past Medical History  Diagnosis Date  . Hypertension   . High cholesterol   . Anxiety   . Cancer 1993    colon  . Rapid  atrial fibrillation 07/28/2011    Paroxysmal, newly diagnosed.  . Cavitary lesion of lung 07/28/2011    AFB smears x3 negative.  . Acute respiratory failure with hypoxia 07/26/2011  . Bulla of lung 07/28/2011  . Tobacco abuse 08/01/2011  . COPD (chronic obstructive pulmonary disease)     Past Surgical History  Procedure Date  . Colon surgery   . Cervical disc surgery   . Tonsillectomy   . Appendectomy     ZOX:WRUEAV of systems complete and found to be negative unless listed above  PHYSICAL EXAM BP 140/74  Pulse 74  Ht 6\' 1"  (1.854 m)  Wt 130 lb (58.968 kg)  BMI 17.15 kg/m2 General: Well developed, well nourished, in no acute distress Head: Eyes PERRLA, No xanthomas.   Normal cephalic and atramatic  Lungs: Expiratory wheezes without rales or rhonchi. Prolonged expiratory phase.  Heart: HRRR S1 S2, 1/6 systolic murmur..  Pulses are 2+ & equal.            No carotid bruit. No JVD.  No abdominal bruits. No femoral bruits. Abdomen: Bowel sounds are positive, abdomen soft and non-tender without masses or  Hernia's noted. Msk:  Back normal, slow gait. Normal strength and tone for age. Extremities: No clubbing, cyanosis or edema.  DP +1 Neuro: Alert and oriented X 3. Psych:  Good affect, responds appropriately   EKG: SR with 1st degree AV block. Rate of 74 bpm.  ASSESSMENT AND PLAN

## 2011-08-12 NOTE — Assessment & Plan Note (Signed)
Now in NSR. He is on ASA only. Will continue to monitor him for recurrence. No changes in his medications at this time. He will need follow-up labs to continue assessment of kidney function on digoxin. Creatinine on discharge 0.85.

## 2011-08-13 DIAGNOSIS — Z9981 Dependence on supplemental oxygen: Secondary | ICD-10-CM | POA: Diagnosis not present

## 2011-08-13 DIAGNOSIS — J189 Pneumonia, unspecified organism: Secondary | ICD-10-CM | POA: Diagnosis not present

## 2011-08-13 DIAGNOSIS — I4891 Unspecified atrial fibrillation: Secondary | ICD-10-CM | POA: Diagnosis not present

## 2011-08-13 DIAGNOSIS — J449 Chronic obstructive pulmonary disease, unspecified: Secondary | ICD-10-CM | POA: Diagnosis not present

## 2011-08-13 DIAGNOSIS — I10 Essential (primary) hypertension: Secondary | ICD-10-CM | POA: Diagnosis not present

## 2011-08-13 DIAGNOSIS — Z85038 Personal history of other malignant neoplasm of large intestine: Secondary | ICD-10-CM | POA: Diagnosis not present

## 2011-08-15 ENCOUNTER — Telehealth: Payer: Self-pay | Admitting: Adult Health

## 2011-08-15 DIAGNOSIS — I4891 Unspecified atrial fibrillation: Secondary | ICD-10-CM | POA: Diagnosis not present

## 2011-08-15 DIAGNOSIS — I10 Essential (primary) hypertension: Secondary | ICD-10-CM | POA: Diagnosis not present

## 2011-08-15 DIAGNOSIS — J189 Pneumonia, unspecified organism: Secondary | ICD-10-CM | POA: Diagnosis not present

## 2011-08-15 DIAGNOSIS — J449 Chronic obstructive pulmonary disease, unspecified: Secondary | ICD-10-CM | POA: Diagnosis not present

## 2011-08-15 DIAGNOSIS — Z85038 Personal history of other malignant neoplasm of large intestine: Secondary | ICD-10-CM | POA: Diagnosis not present

## 2011-08-15 DIAGNOSIS — Z9981 Dependence on supplemental oxygen: Secondary | ICD-10-CM | POA: Diagnosis not present

## 2011-08-15 NOTE — Telephone Encounter (Signed)
**Note De-Identified Mareesa Gathright Obfuscation** Pt's Reference # [id number] with [employer]. Prior Auth. requested today and approval decision will be faxed to [place] within 24 to 72 hours. Pt's Aware./LV

## 2011-08-15 NOTE — Telephone Encounter (Signed)
PT WIFE CALLED TO LET us KNOW THAT WE NEED TO DO PRE-AUTHERIZATION ON THE DIGOXIN RX THROUGH [employer] [phone].

## 2011-08-18 DIAGNOSIS — I4891 Unspecified atrial fibrillation: Secondary | ICD-10-CM | POA: Diagnosis not present

## 2011-08-18 DIAGNOSIS — J189 Pneumonia, unspecified organism: Secondary | ICD-10-CM | POA: Diagnosis not present

## 2011-08-18 DIAGNOSIS — J449 Chronic obstructive pulmonary disease, unspecified: Secondary | ICD-10-CM | POA: Diagnosis not present

## 2011-08-18 DIAGNOSIS — I10 Essential (primary) hypertension: Secondary | ICD-10-CM | POA: Diagnosis not present

## 2011-08-18 DIAGNOSIS — Z85038 Personal history of other malignant neoplasm of large intestine: Secondary | ICD-10-CM | POA: Diagnosis not present

## 2011-08-18 DIAGNOSIS — Z9981 Dependence on supplemental oxygen: Secondary | ICD-10-CM | POA: Diagnosis not present

## 2011-08-20 DIAGNOSIS — Z9981 Dependence on supplemental oxygen: Secondary | ICD-10-CM | POA: Diagnosis not present

## 2011-08-20 DIAGNOSIS — J449 Chronic obstructive pulmonary disease, unspecified: Secondary | ICD-10-CM | POA: Diagnosis not present

## 2011-08-20 DIAGNOSIS — J189 Pneumonia, unspecified organism: Secondary | ICD-10-CM | POA: Diagnosis not present

## 2011-08-20 DIAGNOSIS — Z85038 Personal history of other malignant neoplasm of large intestine: Secondary | ICD-10-CM | POA: Diagnosis not present

## 2011-08-20 DIAGNOSIS — I10 Essential (primary) hypertension: Secondary | ICD-10-CM | POA: Diagnosis not present

## 2011-08-20 DIAGNOSIS — I4891 Unspecified atrial fibrillation: Secondary | ICD-10-CM | POA: Diagnosis not present

## 2011-08-20 NOTE — Telephone Encounter (Signed)
**Note De-Identified Lachrista Heslin Obfuscation** Digoxin has been approved through [facility] and will not need approval again until 03-02-12./LV

## 2011-08-21 DIAGNOSIS — J189 Pneumonia, unspecified organism: Secondary | ICD-10-CM | POA: Diagnosis not present

## 2011-08-21 DIAGNOSIS — J449 Chronic obstructive pulmonary disease, unspecified: Secondary | ICD-10-CM | POA: Diagnosis not present

## 2011-08-21 DIAGNOSIS — Z85038 Personal history of other malignant neoplasm of large intestine: Secondary | ICD-10-CM | POA: Diagnosis not present

## 2011-08-21 DIAGNOSIS — Z9981 Dependence on supplemental oxygen: Secondary | ICD-10-CM | POA: Diagnosis not present

## 2011-08-21 DIAGNOSIS — I10 Essential (primary) hypertension: Secondary | ICD-10-CM | POA: Diagnosis not present

## 2011-08-21 DIAGNOSIS — I4891 Unspecified atrial fibrillation: Secondary | ICD-10-CM | POA: Diagnosis not present

## 2011-08-22 DIAGNOSIS — J189 Pneumonia, unspecified organism: Secondary | ICD-10-CM | POA: Diagnosis not present

## 2011-08-22 DIAGNOSIS — J449 Chronic obstructive pulmonary disease, unspecified: Secondary | ICD-10-CM | POA: Diagnosis not present

## 2011-08-22 DIAGNOSIS — Z9981 Dependence on supplemental oxygen: Secondary | ICD-10-CM | POA: Diagnosis not present

## 2011-08-22 DIAGNOSIS — I4891 Unspecified atrial fibrillation: Secondary | ICD-10-CM | POA: Diagnosis not present

## 2011-08-22 DIAGNOSIS — I10 Essential (primary) hypertension: Secondary | ICD-10-CM | POA: Diagnosis not present

## 2011-08-22 DIAGNOSIS — Z85038 Personal history of other malignant neoplasm of large intestine: Secondary | ICD-10-CM | POA: Diagnosis not present

## 2011-08-25 DIAGNOSIS — J449 Chronic obstructive pulmonary disease, unspecified: Secondary | ICD-10-CM | POA: Diagnosis not present

## 2011-08-25 DIAGNOSIS — I4891 Unspecified atrial fibrillation: Secondary | ICD-10-CM | POA: Diagnosis not present

## 2011-08-25 DIAGNOSIS — Z9981 Dependence on supplemental oxygen: Secondary | ICD-10-CM | POA: Diagnosis not present

## 2011-08-25 DIAGNOSIS — Z85038 Personal history of other malignant neoplasm of large intestine: Secondary | ICD-10-CM | POA: Diagnosis not present

## 2011-08-25 DIAGNOSIS — J189 Pneumonia, unspecified organism: Secondary | ICD-10-CM | POA: Diagnosis not present

## 2011-08-25 DIAGNOSIS — I10 Essential (primary) hypertension: Secondary | ICD-10-CM | POA: Diagnosis not present

## 2011-08-26 DIAGNOSIS — I10 Essential (primary) hypertension: Secondary | ICD-10-CM | POA: Diagnosis not present

## 2011-08-26 DIAGNOSIS — Z85038 Personal history of other malignant neoplasm of large intestine: Secondary | ICD-10-CM | POA: Diagnosis not present

## 2011-08-26 DIAGNOSIS — Z9981 Dependence on supplemental oxygen: Secondary | ICD-10-CM | POA: Diagnosis not present

## 2011-08-26 DIAGNOSIS — I4891 Unspecified atrial fibrillation: Secondary | ICD-10-CM | POA: Diagnosis not present

## 2011-08-26 DIAGNOSIS — J189 Pneumonia, unspecified organism: Secondary | ICD-10-CM | POA: Diagnosis not present

## 2011-08-26 DIAGNOSIS — J449 Chronic obstructive pulmonary disease, unspecified: Secondary | ICD-10-CM | POA: Diagnosis not present

## 2011-08-27 DIAGNOSIS — I4891 Unspecified atrial fibrillation: Secondary | ICD-10-CM | POA: Diagnosis not present

## 2011-08-27 DIAGNOSIS — J189 Pneumonia, unspecified organism: Secondary | ICD-10-CM | POA: Diagnosis not present

## 2011-08-27 DIAGNOSIS — I10 Essential (primary) hypertension: Secondary | ICD-10-CM | POA: Diagnosis not present

## 2011-08-27 DIAGNOSIS — Z9981 Dependence on supplemental oxygen: Secondary | ICD-10-CM | POA: Diagnosis not present

## 2011-08-27 DIAGNOSIS — Z85038 Personal history of other malignant neoplasm of large intestine: Secondary | ICD-10-CM | POA: Diagnosis not present

## 2011-08-27 DIAGNOSIS — J449 Chronic obstructive pulmonary disease, unspecified: Secondary | ICD-10-CM | POA: Diagnosis not present

## 2011-08-28 DIAGNOSIS — I4891 Unspecified atrial fibrillation: Secondary | ICD-10-CM | POA: Diagnosis not present

## 2011-08-28 DIAGNOSIS — Z85038 Personal history of other malignant neoplasm of large intestine: Secondary | ICD-10-CM | POA: Diagnosis not present

## 2011-08-28 DIAGNOSIS — J449 Chronic obstructive pulmonary disease, unspecified: Secondary | ICD-10-CM | POA: Diagnosis not present

## 2011-08-28 DIAGNOSIS — J189 Pneumonia, unspecified organism: Secondary | ICD-10-CM | POA: Diagnosis not present

## 2011-08-28 DIAGNOSIS — Z9981 Dependence on supplemental oxygen: Secondary | ICD-10-CM | POA: Diagnosis not present

## 2011-08-28 DIAGNOSIS — I10 Essential (primary) hypertension: Secondary | ICD-10-CM | POA: Diagnosis not present

## 2011-09-01 DIAGNOSIS — R000 Tachycardia, unspecified: Secondary | ICD-10-CM | POA: Diagnosis not present

## 2011-09-01 DIAGNOSIS — I10 Essential (primary) hypertension: Secondary | ICD-10-CM | POA: Diagnosis not present

## 2011-09-01 DIAGNOSIS — Z9981 Dependence on supplemental oxygen: Secondary | ICD-10-CM | POA: Diagnosis not present

## 2011-09-01 DIAGNOSIS — J449 Chronic obstructive pulmonary disease, unspecified: Secondary | ICD-10-CM | POA: Diagnosis not present

## 2011-09-01 DIAGNOSIS — J4489 Other specified chronic obstructive pulmonary disease: Secondary | ICD-10-CM | POA: Diagnosis not present

## 2011-09-01 DIAGNOSIS — J189 Pneumonia, unspecified organism: Secondary | ICD-10-CM | POA: Diagnosis not present

## 2011-09-01 DIAGNOSIS — I4891 Unspecified atrial fibrillation: Secondary | ICD-10-CM | POA: Diagnosis not present

## 2011-09-01 DIAGNOSIS — Z85038 Personal history of other malignant neoplasm of large intestine: Secondary | ICD-10-CM | POA: Diagnosis not present

## 2011-09-03 DIAGNOSIS — J449 Chronic obstructive pulmonary disease, unspecified: Secondary | ICD-10-CM | POA: Diagnosis not present

## 2011-09-03 DIAGNOSIS — Z9981 Dependence on supplemental oxygen: Secondary | ICD-10-CM | POA: Diagnosis not present

## 2011-09-03 DIAGNOSIS — J189 Pneumonia, unspecified organism: Secondary | ICD-10-CM | POA: Diagnosis not present

## 2011-09-03 DIAGNOSIS — I4891 Unspecified atrial fibrillation: Secondary | ICD-10-CM | POA: Diagnosis not present

## 2011-09-03 DIAGNOSIS — I10 Essential (primary) hypertension: Secondary | ICD-10-CM | POA: Diagnosis not present

## 2011-09-03 DIAGNOSIS — Z85038 Personal history of other malignant neoplasm of large intestine: Secondary | ICD-10-CM | POA: Diagnosis not present

## 2011-09-12 LAB — AFB CULTURE WITH SMEAR (NOT AT ARMC)
Acid Fast Smear: NONE SEEN
Special Requests: NORMAL

## 2011-09-16 LAB — AFB CULTURE WITH SMEAR (NOT AT ARMC)

## 2011-09-22 LAB — AFB CULTURE WITH SMEAR (NOT AT ARMC): Acid Fast Smear: NONE SEEN

## 2011-09-30 ENCOUNTER — Other Ambulatory Visit (HOSPITAL_COMMUNITY): Payer: Self-pay | Admitting: Family Medicine

## 2011-09-30 DIAGNOSIS — J449 Chronic obstructive pulmonary disease, unspecified: Secondary | ICD-10-CM

## 2011-09-30 DIAGNOSIS — R7301 Impaired fasting glucose: Secondary | ICD-10-CM | POA: Diagnosis not present

## 2011-09-30 DIAGNOSIS — I10 Essential (primary) hypertension: Secondary | ICD-10-CM

## 2011-09-30 DIAGNOSIS — R918 Other nonspecific abnormal finding of lung field: Secondary | ICD-10-CM

## 2011-09-30 DIAGNOSIS — Z681 Body mass index (BMI) 19 or less, adult: Secondary | ICD-10-CM | POA: Diagnosis not present

## 2011-09-30 DIAGNOSIS — E785 Hyperlipidemia, unspecified: Secondary | ICD-10-CM | POA: Diagnosis not present

## 2011-10-01 DIAGNOSIS — R7301 Impaired fasting glucose: Secondary | ICD-10-CM | POA: Diagnosis not present

## 2011-10-01 DIAGNOSIS — Z125 Encounter for screening for malignant neoplasm of prostate: Secondary | ICD-10-CM | POA: Diagnosis not present

## 2011-10-01 DIAGNOSIS — I10 Essential (primary) hypertension: Secondary | ICD-10-CM | POA: Diagnosis not present

## 2011-10-01 DIAGNOSIS — E785 Hyperlipidemia, unspecified: Secondary | ICD-10-CM | POA: Diagnosis not present

## 2011-10-02 ENCOUNTER — Ambulatory Visit (HOSPITAL_COMMUNITY)
Admission: RE | Admit: 2011-10-02 | Discharge: 2011-10-02 | Disposition: A | Discharge status: Home / Self Care | Payer: Medicare Other | Source: Ambulatory Visit | Attending: Family Medicine | Admitting: Family Medicine

## 2011-10-02 ENCOUNTER — Other Ambulatory Visit (HOSPITAL_COMMUNITY): Payer: MEDICARE

## 2011-10-02 DIAGNOSIS — J449 Chronic obstructive pulmonary disease, unspecified: Secondary | ICD-10-CM | POA: Insufficient documentation

## 2011-10-02 DIAGNOSIS — J4489 Other specified chronic obstructive pulmonary disease: Secondary | ICD-10-CM

## 2011-10-02 DIAGNOSIS — J438 Other emphysema: Secondary | ICD-10-CM | POA: Diagnosis not present

## 2011-10-02 DIAGNOSIS — R918 Other nonspecific abnormal finding of lung field: Secondary | ICD-10-CM

## 2011-10-02 DIAGNOSIS — J984 Other disorders of lung: Secondary | ICD-10-CM | POA: Diagnosis not present

## 2011-10-07 DIAGNOSIS — R000 Tachycardia, unspecified: Secondary | ICD-10-CM | POA: Diagnosis not present

## 2011-10-07 DIAGNOSIS — J449 Chronic obstructive pulmonary disease, unspecified: Secondary | ICD-10-CM | POA: Diagnosis not present

## 2011-10-28 DIAGNOSIS — Z961 Presence of intraocular lens: Secondary | ICD-10-CM | POA: Diagnosis not present

## 2011-12-09 DIAGNOSIS — R000 Tachycardia, unspecified: Secondary | ICD-10-CM | POA: Diagnosis not present

## 2011-12-09 DIAGNOSIS — J449 Chronic obstructive pulmonary disease, unspecified: Secondary | ICD-10-CM | POA: Diagnosis not present

## 2011-12-22 ENCOUNTER — Ambulatory Visit: Payer: Medicare Other | Admitting: Physician Assistant

## 2012-01-05 ENCOUNTER — Other Ambulatory Visit (HOSPITAL_COMMUNITY): Payer: Self-pay | Admitting: Family Medicine

## 2012-01-05 DIAGNOSIS — R918 Other nonspecific abnormal finding of lung field: Secondary | ICD-10-CM

## 2012-01-05 DIAGNOSIS — E785 Hyperlipidemia, unspecified: Secondary | ICD-10-CM | POA: Diagnosis not present

## 2012-01-05 DIAGNOSIS — Z23 Encounter for immunization: Secondary | ICD-10-CM | POA: Diagnosis not present

## 2012-01-05 DIAGNOSIS — I10 Essential (primary) hypertension: Secondary | ICD-10-CM | POA: Diagnosis not present

## 2012-01-05 DIAGNOSIS — R7301 Impaired fasting glucose: Secondary | ICD-10-CM | POA: Diagnosis not present

## 2012-01-07 ENCOUNTER — Ambulatory Visit (HOSPITAL_COMMUNITY)
Admission: RE | Admit: 2012-01-07 | Discharge: 2012-01-07 | Disposition: A | Discharge status: Home / Self Care | Payer: Medicare Other | Source: Ambulatory Visit | Attending: Family Medicine | Admitting: Family Medicine

## 2012-01-07 DIAGNOSIS — R918 Other nonspecific abnormal finding of lung field: Secondary | ICD-10-CM

## 2012-01-08 ENCOUNTER — Ambulatory Visit (INDEPENDENT_AMBULATORY_CARE_PROVIDER_SITE_OTHER): Payer: Medicare Other | Admitting: Adult Health

## 2012-01-08 ENCOUNTER — Other Ambulatory Visit: Payer: Self-pay | Admitting: Adult Health

## 2012-01-08 ENCOUNTER — Encounter: Payer: Self-pay | Admitting: Adult Health

## 2012-01-08 VITALS — BP 150/90 | HR 74 | Ht 72.0 in | Wt 150.1 lb

## 2012-01-08 DIAGNOSIS — I10 Essential (primary) hypertension: Secondary | ICD-10-CM

## 2012-01-08 DIAGNOSIS — R918 Other nonspecific abnormal finding of lung field: Secondary | ICD-10-CM

## 2012-01-08 DIAGNOSIS — F17200 Nicotine dependence, unspecified, uncomplicated: Secondary | ICD-10-CM | POA: Diagnosis not present

## 2012-01-08 DIAGNOSIS — I4891 Unspecified atrial fibrillation: Secondary | ICD-10-CM | POA: Diagnosis not present

## 2012-01-08 DIAGNOSIS — Z720 Tobacco use: Secondary | ICD-10-CM

## 2012-01-08 MED ORDER — DILTIAZEM HCL ER COATED BEADS 240 MG PO CP24: 240.0000 mg | ORAL_CAPSULE | Freq: Every day | ORAL | Status: DC

## 2012-01-08 MED ORDER — DIGOXIN 250 MCG PO TABS: 0.2500 mg | ORAL_TABLET | Freq: Every day | ORAL | Status: DC

## 2012-01-08 NOTE — Assessment & Plan Note (Signed)
He remains in NSR, without complaint of recurrence of palpitations or rapid HR. He has gained 20 lbs and has much more energy. He asked about being taken off of the medication for Atrial fib. I have explained to him that with his age, it is best for him to continue on this medication to prevent and further recurrence. He had an echocardiogram ordered while in the hospital but it was inadvertently cancelled. Will have this completed.  Will also have labs for digoxin level done. He had chemistries drawn last week per Dr. Phillips Odor . Will request copies of them.

## 2012-01-08 NOTE — Assessment & Plan Note (Signed)
He has stopped smoking for one year.

## 2012-01-08 NOTE — Patient Instructions (Signed)
Your physician recommends that you schedule a follow-up appointment in: 6 months  Your physician has requested that you have an echocardiogram. Echocardiography is a painless test that uses sound waves to create images of your heart. It provides your doctor with information about the size and shape of your heart and how well your heart's chambers and valves are working. This procedure takes approximately one hour. There are no restrictions for this procedure.  Your physician recommends that you return for lab work in: Within the week

## 2012-01-08 NOTE — Progress Notes (Signed)
HPI: [name] is a very pleasant 76 y/o male patient of Dr.Rothbart we are seeing on 6 month follow up. He has a history of PAF, is on dig and diltiazem, and on ASA. He is doing much better since being seen last, with a 20 lb wt gain, more energy and better breathing.  He stopped smoking one year ago, and uses an electronic cigarette. He has a CT of his chest completed yesterday per Dr. Phillips Odor due to abnormal lung nodules. He is awaiting the results.  He has no complaints today of rapid HR, increased DOE, or fatigue. He is medically compliant.  Allergies  Allergen Reactions  . Celebrex (Celecoxib)   . Tetracyclines & Related Hives    Current Outpatient Prescriptions  Medication Sig Dispense Refill  . alprazolam (XANAX) 2 MG tablet Take 2 mg by mouth 3 (three) times daily.      Marland Kitchen aspirin EC 81 MG tablet Take 81 mg by mouth daily.      . ciprofloxacin (CIPRO) 500 MG tablet       . digoxin (LANOXIN) 0.25 MG tablet Take 1 tablet (0.25 mg total) by mouth daily.  90 tablet  6  . diltiazem (CARDIZEM CD) 240 MG 24 hr capsule Take 1 capsule (240 mg total) by mouth daily. Medication for your blood pressure and her heart rate.  90 capsule  6  . Multiple Vitamins-Minerals (MULTIVITAMIN WITH MINERALS) tablet Take 1 tablet by mouth daily.      . Omega-3 Fatty Acids (FISH OIL) 1000 MG CAPS Take 1 capsule by mouth daily.      Marland Kitchen PROAIR HFA 108 (90 BASE) MCG/ACT inhaler       . simvastatin (ZOCOR) 20 MG tablet Take 20 mg by mouth daily.      . [DISCONTINUED] digoxin (LANOXIN) 0.25 MG tablet Take 1 tablet (0.25 mg total) by mouth daily.  90 tablet  6  . [DISCONTINUED] diltiazem (CARDIZEM CD) 240 MG 24 hr capsule Take 1 capsule (240 mg total) by mouth daily. Medication for your blood pressure and her heart rate.  90 capsule  6  . ZOSTAVAX 16109 UNT/0.65ML injection         Past Medical History  Diagnosis Date  . Hypertension   . High cholesterol   . Anxiety   . Cancer 1993    colon  . Rapid atrial  fibrillation 07/28/2011    Paroxysmal, newly diagnosed.  . Cavitary lesion of lung 07/28/2011    AFB smears x3 negative.  . Acute respiratory failure with hypoxia 07/26/2011  . Bulla of lung 07/28/2011  . Tobacco abuse 08/01/2011  . COPD (chronic obstructive pulmonary disease)     Past Surgical History  Procedure Date  . Colon surgery   . Cervical disc surgery   . Tonsillectomy   . Appendectomy     UEA:[id number] of systems complete and found to be negative unless listed above  PHYSICAL EXAM BP 150/90  Pulse 74  Ht 6' (1.829 m)  Wt 150 lb 1.3 oz (68.076 kg)  BMI 20.35 kg/m2  SpO2 96%  General: Well developed, well nourished, in no acute distress Head: Eyes PERRLA, No xanthomas.   Normal cephalic and atramatic  Lungs: Clear bilaterally to auscultation and percussion. Heart: HRRR S1 S2,  1/6 systolic murmur heard best at the RSB and apex. Pulses are 2+ & equal.            No carotid bruit. No JVD.  No abdominal bruits. No femoral bruits. Abdomen:  Bowel sounds are positive, abdomen soft and non-tender without masses or                  Hernia's noted. Msk:  Back normal, normal gait. Normal strength and tone for age. Extremities: No clubbing, cyanosis or edema.  DP +1 Neuro: Alert and oriented X 3. Numbness in his feet. Psych:  Good affect, responds appropriately  EKG: NSR with rate of 68 bpm. No evidence of Atrial fib.  ASSESSMENT AND PLAN

## 2012-01-08 NOTE — Assessment & Plan Note (Signed)
CT scan completed on 01/07/2012 is reviewed.  It is unchanged from CT scan completed in May of 2013. It is recommended that he have follow up CT in 12 months. This will be managed by Dr. Phillips Odor.

## 2012-01-08 NOTE — Assessment & Plan Note (Signed)
Good control of BP today. Will not make any changes at this time to his medications. He will see [place] again in 6 months unless he is symptomatic.

## 2012-01-14 ENCOUNTER — Ambulatory Visit (HOSPITAL_COMMUNITY)
Admission: RE | Admit: 2012-01-14 | Discharge: 2012-01-14 | Disposition: A | Discharge status: Home / Self Care | Payer: Medicare Other | Source: Ambulatory Visit | Attending: Adult Health | Admitting: Adult Health

## 2012-01-14 DIAGNOSIS — I10 Essential (primary) hypertension: Secondary | ICD-10-CM | POA: Insufficient documentation

## 2012-01-14 DIAGNOSIS — Z87891 Personal history of nicotine dependence: Secondary | ICD-10-CM | POA: Diagnosis not present

## 2012-01-14 DIAGNOSIS — I4891 Unspecified atrial fibrillation: Secondary | ICD-10-CM | POA: Insufficient documentation

## 2012-01-14 NOTE — Progress Notes (Signed)
*  PRELIMINARY RESULTS* Echocardiogram 2D Echocardiogram has been performed.  Conrad Woodruff 01/14/2012, 1:16 PM

## 2012-01-23 ENCOUNTER — Encounter: Payer: Self-pay | Admitting: Cardiothoracic Surgery

## 2012-01-23 ENCOUNTER — Institutional Professional Consult (permissible substitution) (INDEPENDENT_AMBULATORY_CARE_PROVIDER_SITE_OTHER): Payer: Medicare Other | Admitting: Cardiothoracic Surgery

## 2012-01-23 VITALS — BP 152/67 | HR 69 | Resp 19 | Ht 72.0 in | Wt 150.0 lb

## 2012-01-23 DIAGNOSIS — R918 Other nonspecific abnormal finding of lung field: Secondary | ICD-10-CM | POA: Diagnosis not present

## 2012-01-23 NOTE — Patient Instructions (Signed)
Pulmonary Nodule A pulmonary (lung) nodule is small, round growth in the lung. The size of a pulmonary nodule can be as small as a pencil eraser (1/5 inch or 4 millmeters) to a little bigger than your biggest toenail (1 inch or 25 millimeters). A pulmonary nodule is usually an unplanned finding. It may be found on a chest X-ray or a computed tomography (CT) scan when you have imaging tests of your lungs done. When a pulmonary nodule is found, tests will be done to determine if the nodule is benign (not cancerous) or malignant (cancerous). Follow-up treatment or testing is based on the size of the pulmonary nodule and your risk of getting lung cancer.  CAUSES Causes of pulmonary nodules can vary.  Benign pulmonary nodules  can be caused from different things. Some of these things include:  Infection. This can be a common cause of a benign pulmonary nodule. The infection may be active (a current infection) or an old infection that is no longer active. Three types of infections can cause a pulmonary nodule. These are:  Bacterial Infection.  Fungal infection.  Viral Infections.  Hematoma. This is a bruise in the lung. A hematoma can happen from an injury to your chest.  Some common diseases can lead to benign pulmonary nodules. For example, rheumatoid arthritis can be a cause of a pulmonary nodule.  Other unusual things can cause a benign pulmonary nodule. These can include:  Having had tuberculosis.  Rare diseases, such as a lung cyst. Malignant pulmonary nodules.  These are cancerous growths. The cancer may have:  Started in the lung. Some lung cancers first detected as a pulmonary nodule.  Spread to the lung from cancer somewhere else in the body. This is called metastatic cancer.  Certain risk factors make a cancerous pulmonary nodule more likely. They include:  Age. As people get older, a pulmonary nodule is more likely to be cancerous.  Cancer history. If one of your immediate  family members has had cancer, you have a higher risk of developing cancer.  Smoking. This includes people who currently smoke and those who have quit. DIAGNOSIS To diagnose whether a pulmonary nodule is benign or malignant, a variety of tests will be done. This includes things such as:  Health history. Questions regarding your current health, past health, and family health will be asked.  Blood tests. Results of blood work can show:  Tumor markers for cancer.  Any type of infection.  A skin test called a tuberculin (TB) test may be done. This test can tell if you have been exposed to the germ that causes tuberculosis.  Imaging tests. These take pictures of your lungs. Types of imaging tests include:  Chest X-ray. This can help in several ways. An X-ray gives a close-up look at the pulmonary nodule. A new X-ray can be compared with any X-rays you have had in the past.   Computed tomography  (CT) scan. This test shows smaller pulmonary nodules more clearly than an X-ray.  Positron emission tomography  (PET) scan. This is a test that uses a radioactive substance to identify a pulmonary nodule. A safe amount of radioactive substance is injected into the blood stream. Then, the scan takes a picture of the pulmonary nodule. A malignant pulmonary nodule will absorb the substance faster than a benign pulmonary nodule. The radioactive substance is eliminated from your body in your urine.  Biopsy.  This removes a tiny piece of the pulmonary nodule so it can be checked   under a microscope. Medicine will be given to help keep you relaxed and pain free when a biopsy is done. Types of biopsies include:  Bronchoscopy . This is a surgical procedure. It can be used for pulmonary nodules that are close to the airways in the lung. It uses a scope (a thin tube) with a tiny camera and light on the end. The scope is put in the windpipe. Your caregiver can then see inside the lung. A tiny tool put through the  scope is used to take a small sample of the pulmonary nodule tissue.  Transthoracic needle aspiration . This method is used if the pulmonary nodule is far away from the air passages in the lung. A long, thin needle is put through the chest into the lung nodule. A CT scan is done at the same time which can make it easier to locate the pulmonary nodule.  Surgical lung biopsy . This is a surgical procedure in which the pulmonary nodule is removed. This is usually recommended when the pulmonary nodule is most likely malignant or a biopsy cannot be obtained by either bronchoscopy or transthoracic needle aspiration. PULMONARY NODULE FOLLOW-UP RECOMMENDATIONS The frequency of pulmonary nodule follow-up is based on your risk factors and size of the pulmonary nodule. If your caregiver suspects the pulmonary nodule is cancerous or the pulmonary nodule changes during any of the follow-up CT scans, additional testing or biopsies will be done.   If you have no or low risk of getting lung cancer (non-smoker, no personal cancer history), recommended follow-up is based on the following pulmonary nodule size:  A pulmonary nodule that is < 4 mm does not require any follow-up.  A pulmonary nodule that is 4 to 6 mm should be re-imaged by CT scan in 12 months.  A pulmonary nodule that is 6 to 8 mm should be re-imaged by CT scan at 6 to 12 months and then again at 18 to 24 months if no change in size.  A pulmonary nodule > 8 mm in size should be followed closely and re-imaged by CT scan at 3, 9, and 24 months.   If you are at risk of getting lung cancer (current or former smoker, family history of cancer), recommended follow-up is based on the following pulmonary nodule size:  A pulmonary nodule that is < 4 mm in size should be re-imaged by CT scan in 12 months.  A pulmonary nodule that is 4 to 6 mm in size should be re-imaged by CT scan at 6 to 12 months and again at 18 to 24 months.  A pulmonary nodule that is  6 to 8 mm in size should be re-imaged by CT scan at 3, 9, and 24 months.  A pulmonary nodule > 8 mm in size should be followed closely and re-imaged by CT scan at 3, 9, and 24 months. SEEK MEDICAL CARE IF: While waiting for test results to determine what type of pulmonary nodule you have, be sure to contact your caregiver if you:  Have trouble breathing when you are active.  Feel sick or unusually tired.  Do not feel like eating.  Lose weight without trying to.  Develop chills or night sweats.  Mild or moderate fevers generally have no long-term effects and often do not require treatment. There are a few exceptions (see below). SEEK IMMEDIATE MEDICAL CARE IF:  You cannot catch your breath or you begin wheezing.  You cannot stop coughing.  You cough up blood.    You feel like you are going to pass out or become dizzy.  You have sudden chest pain.  You have a fever or persistent symptoms for more than 72 hours.  You have a fever and your symptoms suddenly get worse. MAKE SURE YOU   Understand these instructions.  Will watch your condition.  Will get help right away if you are not doing well or get worse. Document Released: 12/15/2008 Document Revised: 05/12/2011 Document Reviewed: 12/15/2008 [employer] Patient Information 2013 [employer].  

## 2012-01-25 NOTE — Progress Notes (Signed)
[address]            [place] [address]          [phone]      Katheren Shams [facility] Medical Record #[id number] Date of Birth: 08-20-1935  Referring: Assunta Found, MD Primary Care: Colette Ribas, MD  Chief Complaint:    Chief Complaint  Patient presents with  . Lung Lesion    eval for pulmonary nodules    History of Present Illness:    The patient is an elderly 76 year old male, referred because of abnormal CT scan of the chest. He was treated in the spring of 2013 for a COPD exacerbation and question of tuberculosis. Cultures were negative but CT scan showed multiple bilateral ground glass densities. Follow up scans were done in August of 2013 and January 07, 2012. The patient has been a smoker for more than 50 years just recently switching to electronic cigarettes.       Current Activity/ Functional Status: Patient is independent with mobility/ambulation, transfers, ADL's, IADL's.   Past Medical History  Diagnosis Date  . Hypertension   . High cholesterol   . Anxiety   . Cancer 1993    colon  . Rapid atrial fibrillation 07/28/2011    Paroxysmal, newly diagnosed.  . Cavitary lesion of lung 07/28/2011    AFB smears x3 negative.  . Acute respiratory failure with hypoxia 07/26/2011  . Bulla of lung 07/28/2011  . Tobacco abuse 08/01/2011  . COPD (chronic obstructive pulmonary disease)     Past Surgical History  Procedure Date  . Colon surgery   . Cervical disc surgery   . Tonsillectomy   . Appendectomy     Family History  Problem Relation Age of Onset  . Heart disease Mother   . Stroke Father   . Pneumonia Sister   . Heart disease Brother     History   Social History  . Marital Status: Married    Spouse Name: N/A    Number of Children: N/A  . Years of Education: N/A   Occupational History  . Worked for [employer]   Social History Main Topics  . Smoking status: Former Smoker    Quit date:  07/16/2011  . Smokeless tobacco: Not on file     Comment: started using electric ciggerette in 06-2010  . Alcohol Use: No  . Drug Use: No  . Sexually Active: Not on file      History  Smoking status  . Former Smoker  . Quit date: 07/16/2011  Smokeless tobacco  . Not on file    Comment: started using electric ciggerette in 06-2010    History  Alcohol Use No     Allergies  Allergen Reactions  . Celebrex (Celecoxib)   . Tetracyclines & Related Hives    Current Outpatient Prescriptions  Medication Sig Dispense Refill  . alprazolam (XANAX) 2 MG tablet Take 2 mg by mouth 3 (three) times daily.      Marland Kitchen aspirin EC 81 MG tablet Take 81 mg by mouth daily.      . digoxin (LANOXIN) 0.25 MG tablet Take 1 tablet (0.25 mg total) by mouth daily.  90 tablet  6  . diltiazem (CARDIZEM CD) 240 MG 24 hr capsule Take 1 capsule (240 mg total) by mouth daily. Medication for your blood pressure and her heart rate.  90  capsule  6  . Multiple Vitamins-Minerals (MULTIVITAMIN WITH MINERALS) tablet Take 1 tablet by mouth daily.      . Omega-3 Fatty Acids (FISH OIL) 1000 MG CAPS Take 1 capsule by mouth daily.      Marland Kitchen PROAIR HFA 108 (90 BASE) MCG/ACT inhaler       . simvastatin (ZOCOR) 20 MG tablet Take 20 mg by mouth daily.           Review of Systems:     Cardiac Review of Systems: Y or N  Chest Pain [ n   ]  Resting SOB [ n  ] Exertional SOB  [ y ]  Pollyann Kennedy Milo.Brash  ]   Pedal Edema [  n ]    Palpitations [n  ] Syncope  [ n ]   Presyncope n   ]  General Review of Systems: [Y] = yes [  ]=no Constitional: recent weight change [n  ]; anorexia [ n ]; fatigue [ n ]; nausea [  ]; night sweats [ n ]; fever [ n ]; or chills [ n ];                                                                                                                                            Eye : blurred vision [  ]; diplopia [   ]; vision changes [  ];  Amaurosis fugax[  ]; Resp: cough [ y ];  wheezing[y  ];  hemoptysis[n  ];  shortness of breath[  ]; paroxysmal nocturnal dyspnea[  ]; dyspnea on exertion[ y ]; or orthopnea[  ];  GI:  gallstones[ n ], vomiting[  ];  dysphagia[  ]; melena[  ];  hematochezia [  ]; heartburn[  ];   Hx of  Colonoscopy[  ]; GU: kidney stones [  ]; hematuria[  ];   dysuria [  ];  nocturia[  ];  history of     obstruction [  ];             Skin: rash, swelling[  ];, hair loss[  ];  peripheral edema[  ];  or itching[  ]; Musculosketetal: myalgias[  ];  joint swelling[  ];  joint erythema[  ];  joint pain[  ];  back pain[  ];  Heme/Lymph: bruising[  ];  bleeding[  ];  anemia[  ];  Neuro: TIA[  ];  headaches[  ];  stroke[  ];  vertigo[  ];  seizures[  ];   paresthesias[  ];  difficulty walking[  ];  Psych:depression[  ]; anxiety[  ];  Endocrine: diabetes[ y ];  thyroid dysfunction[  ];  Immunizations: Flu [ y ]; Pneumococcal[y  ];  Other:  Physical Exam: BP 152/67  Pulse 69  Resp 19  Ht 6' (1.829 m)  Wt 150 lb (68.04 kg)  BMI 20.34 kg/m2  SpO2  94%  General appearance: alert, cooperative, appears older than stated age, distracted and no distress Neurologic: intact Heart: regular rate and rhythm, S1, S2 normal, no murmur, click, rub or gallop and normal apical impulse Lungs: diminished breath sounds bilaterally Abdomen: soft, non-tender; bowel sounds normal; no masses,  no organomegaly Extremities: extremities normal, atraumatic, no cyanosis or edema and Homans sign is negative, no sign of DVT No carotid bruits, pedal pulses are present dp & pt bilaterial  Diagnostic Studies & Laboratory data:     Recent Radiology Findings:  Ct Chest Wo Contrast  01/07/2012  *RADIOLOGY REPORT*  Clinical Data: Follow-up pulmonary nodules.  History of chronic airway obstruction and aspiration pneumonia.  No history of malignancy.  CT CHEST WITHOUT CONTRAST  Technique:  Multidetector CT imaging of the chest was performed following the standard protocol without IV contrast.  Comparison: Prior chest CTs  dated 10/02/2011 and 07/27/2011.  Findings: The mediastinum has a stable appearance with unchanged normal sinus mediastinal lymph nodes.  There is scattered atherosclerosis of the great vessels, aorta and coronary arteries. There is no pleural or pericardial effusion.  Compared with the original study, the airway thickening and patchy peribronchial opacities within the right lower lobe have resolved. These were likely inflammatory.  However, there are several persistent mixed solid and ground-glass densities.  The largest is positioned peripherally in the right upper lobe and measures approximately 3.1 x 1.9 cm on image 19.  A 9 mm mixed ground-glass opacity in the right upper lobe on image 23 has slightly enlarged. A faint pure ground-glass opacity in the right lower lobe on image 39 is stable.  Likewise, there is a stable ground-glass opacity in the left upper lobe on image 21.  Biapical scarring is stable.  The visualized upper abdomen has a stable appearance.  There are multiple calcified lymph nodes within the porta hepatis.  IMPRESSION:  1.  Compared with the most recent examination of 3 months ago, no significant changes are identified.  No acute process is seen. 2.  There are persistent mixed solid and ground-glass opacities in the right upper lobe as well as additional pure ground-glass opacities.  Although possibly postinflammatory, adenocarcinoma cannot be excluded. Thoracic surgical consultation should be considered.  Followup by CT is recommended in 12 months, with continued annual surveillance for a minimum of 3 years.  These recommendations are taken from "Recommendations for the Management of Subsolid Pulmonary Nodules Detected at CT:  A Statement from the Fleischner Society" Radiology 2013; 266:1, 304- 317.   Original Report Authenticated By: Carey Bullocks, M.D.     Recent Lab Findings: Lab Results  Component Value Date   WBC 11.3* 08/01/2011   HGB 13.3 08/01/2011   HCT 41.5 08/01/2011   PLT  PLATELET CLUMPS NOTED ON SMEAR, COUNT APPEARS ADEQUATE 08/01/2011   GLUCOSE 100* 08/01/2011   ALT 12 07/26/2011   AST 16 07/26/2011   NA 133* 08/01/2011   K 3.9 08/01/2011   CL 96 08/01/2011   CREATININE 0.85 08/01/2011   BUN 7 08/01/2011   CO2 33* 08/01/2011   TSH 1.631 07/28/2011   HGBA1C 6.0* 07/27/2011      Assessment / Plan:    76 year old male, long term smoker with bilateral ground glass opacities stable radiographically since May of 2013. Cannot rule out bronchial alveolar carcinoma but since stable over that time, will continue to monitor.  PET scan not likely to help.   Discussed the CT findings with the patient and his wife. Will  plan to see back in 4 months with a follow up CT of the chest/super D. Will consider endobronchial navigation bronchoscopy and biopsy if there is any change on the follow up CT scan.      Delight Ovens MD  Beeper [phone] Office [phone] 01/23/2012

## 2012-03-10 DIAGNOSIS — F411 Generalized anxiety disorder: Secondary | ICD-10-CM | POA: Diagnosis not present

## 2012-03-10 DIAGNOSIS — IMO0002 Reserved for concepts with insufficient information to code with codable children: Secondary | ICD-10-CM | POA: Diagnosis not present

## 2012-04-12 ENCOUNTER — Other Ambulatory Visit: Payer: Self-pay

## 2012-04-12 DIAGNOSIS — D381 Neoplasm of uncertain behavior of trachea, bronchus and lung: Secondary | ICD-10-CM

## 2012-04-13 DIAGNOSIS — R5383 Other fatigue: Secondary | ICD-10-CM | POA: Diagnosis not present

## 2012-04-13 DIAGNOSIS — R5381 Other malaise: Secondary | ICD-10-CM | POA: Diagnosis not present

## 2012-04-13 DIAGNOSIS — J449 Chronic obstructive pulmonary disease, unspecified: Secondary | ICD-10-CM | POA: Diagnosis not present

## 2012-04-13 DIAGNOSIS — I4891 Unspecified atrial fibrillation: Secondary | ICD-10-CM | POA: Diagnosis not present

## 2012-05-13 ENCOUNTER — Ambulatory Visit: Payer: Medicare Other | Admitting: Cardiothoracic Surgery

## 2012-05-13 ENCOUNTER — Other Ambulatory Visit: Payer: Medicare Other

## 2012-05-21 ENCOUNTER — Ambulatory Visit
Admission: RE | Admit: 2012-05-21 | Discharge: 2012-05-21 | Disposition: A | Discharge status: Home / Self Care | Payer: Medicare Other | Source: Ambulatory Visit | Attending: Cardiothoracic Surgery | Admitting: Cardiothoracic Surgery

## 2012-05-21 ENCOUNTER — Encounter: Payer: Self-pay | Admitting: Cardiothoracic Surgery

## 2012-05-21 ENCOUNTER — Ambulatory Visit (INDEPENDENT_AMBULATORY_CARE_PROVIDER_SITE_OTHER): Payer: Medicare Other | Admitting: Cardiothoracic Surgery

## 2012-05-21 VITALS — BP 164/77 | HR 91 | Resp 16 | Ht 72.0 in | Wt 150.0 lb

## 2012-05-21 DIAGNOSIS — Z7189 Other specified counseling: Secondary | ICD-10-CM | POA: Diagnosis not present

## 2012-05-21 DIAGNOSIS — D381 Neoplasm of uncertain behavior of trachea, bronchus and lung: Secondary | ICD-10-CM

## 2012-05-21 DIAGNOSIS — R918 Other nonspecific abnormal finding of lung field: Secondary | ICD-10-CM

## 2012-05-21 DIAGNOSIS — J984 Other disorders of lung: Secondary | ICD-10-CM | POA: Diagnosis not present

## 2012-05-21 DIAGNOSIS — Z712 Person consulting for explanation of examination or test findings: Secondary | ICD-10-CM

## 2012-05-21 NOTE — Progress Notes (Signed)
[address]       [place] [address]             [phone]       Katheren Shams [facility] Medical Record #[id number] Date of Birth: 01/06/1936  Referring: Corrie Mckusick, MD Primary Care: Colette Ribas, MD  Chief Complaint:    Chief Complaint  Patient presents with  . Lung Lesion    4 month f/u with CT CHEST...SUPER-D    History of Present Illness:    The patient is an elderly 77 year old male, referred because of abnormal CT scan of the chest. He was treated in the spring of 2013 for a COPD exacerbation and question of tuberculosis. He notes exposure to someone with tuberculosis in 1943.  Cultures were negative but CT scan showed multiple bilateral ground glass densities. Follow up scans were done in August of 2013 and January 07, 2012. He comes in today with a mother followup CT scan of the chest . The patient has been a smoker for more than 50 years just recently switching to electronic cigarettes.   Since last seen he continues not to smoke complains mostly of his legs getting weak before he gets short of breath.  Current Activity/ Functional Status: Patient is independent with mobility/ambulation, transfers, ADL's, IADL's.   Past Medical History  Diagnosis Date  . Hypertension   . High cholesterol   . Anxiety   . Cancer 1993    colon  . Rapid atrial fibrillation 07/28/2011    Paroxysmal, newly diagnosed.  . Cavitary lesion of lung 07/28/2011    AFB smears x3 negative.  . Acute respiratory failure with hypoxia 07/26/2011  . Bulla of lung 07/28/2011  . Tobacco abuse 08/01/2011  . COPD (chronic obstructive pulmonary disease)     Past Surgical History  Procedure Laterality Date  . Colon surgery    . Cervical disc surgery    . Tonsillectomy    . Appendectomy      Family History  Problem Relation Age of Onset  . Heart disease Mother   . Stroke Father   . Pneumonia Sister   . Heart disease Brother     History   Social  History  . Marital Status: Married    Spouse Name: N/A    Number of Children: N/A  . Years of Education: N/A   Occupational History  . Worked for [employer]   Social History Main Topics  . Smoking status: Former Smoker    Quit date: 07/16/2011  . Smokeless tobacco: Not on file     Comment: started using electric ciggerette in 06-2010  . Alcohol Use: No  . Drug Use: No  . Sexually Active: Not on file      History  Smoking status  . Former Smoker  . Quit date: 07/16/2011  Smokeless tobacco  . Not on file    Comment: started using electric ciggerette in 06-2010    History  Alcohol Use No     Allergies  Allergen Reactions  . Celebrex (Celecoxib)   . Tetracyclines & Related Hives    Current Outpatient Prescriptions  Medication Sig Dispense Refill  . alprazolam (XANAX) 2 MG tablet Take 2 mg by mouth 3 (three) times daily.      Marland Kitchen aspirin EC 81 MG tablet Take 81 mg by mouth daily.      . digoxin (LANOXIN)  0.25 MG tablet Take 1 tablet (0.25 mg total) by mouth daily.  90 tablet  6  . diltiazem (CARDIZEM CD) 240 MG 24 hr capsule Take 1 capsule (240 mg total) by mouth daily. Medication for your blood pressure and her heart rate.  90 capsule  6  . Multiple Vitamins-Minerals (MULTIVITAMIN WITH MINERALS) tablet Take 1 tablet by mouth daily.      . Omega-3 Fatty Acids (FISH OIL) 1000 MG CAPS Take 1 capsule by mouth daily.      Marland Kitchen PROAIR HFA 108 (90 BASE) MCG/ACT inhaler       . simvastatin (ZOCOR) 20 MG tablet Take 20 mg by mouth daily.       No current facility-administered medications for this visit.       Review of Systems:     Cardiac Review of Systems: Y or N  Chest Pain [ n   ]  Resting SOB [ n  ] Exertional SOB  [ y ]  Pollyann Kennedy Milo.Brash  ]   Pedal Edema [  n ]    Palpitations [n  ] Syncope  [ n ]   Presyncope n   ]  General Review of Systems: [Y] = yes [  ]=no Constitional: recent weight change [n  ]; anorexia [ n ]; fatigue [ n ]; nausea [  ]; night sweats [ n ];  fever [ n ]; or chills [ n ];                                                                                                                                            Eye : blurred vision [  ]; diplopia [   ]; vision changes [  ];  Amaurosis fugax[  ]; Resp: cough [ y ];  wheezing[y  ];  hemoptysis[n  ]; shortness of breath[  ]; paroxysmal nocturnal dyspnea[  ]; dyspnea on exertion[ y ]; or orthopnea[  ];  GI:  gallstones[ n ], vomiting[  ];  dysphagia[  ]; melena[  ];  hematochezia [  ]; heartburn[  ];   Hx of  Colonoscopy[  ]; GU: kidney stones [  ]; hematuria[  ];   dysuria [  ];  nocturia[  ];  history of     obstruction [  ];             Skin: rash, swelling[  ];, hair loss[  ];  peripheral edema[  ];  or itching[  ]; Musculosketetal: myalgias[  ];  joint swelling[  ];  joint erythema[  ];  joint pain[  ];  back pain[  ];  Heme/Lymph: bruising[  ];  bleeding[  ];  anemia[  ];  Neuro: TIA[  ];  headaches[  ];  stroke[  ];  vertigo[  ];  seizures[  ];   paresthesias[  ];  difficulty walking[  ];  Psych:depression[  ]; anxiety[  ];  Endocrine: diabetes[ y ];  thyroid dysfunction[  ];  Immunizations: Flu [ y ]; Pneumococcal[y  ]; shingles Y  Other:  Physical Exam: BP 164/77  Pulse 91  Resp 16  Ht 6' (1.829 m)  Wt 150 lb (68.04 kg)  BMI 20.34 kg/m2  SpO2 93%  General appearance: alert, cooperative, appears older than stated age, distracted and no distress Neurologic: intact Heart: regular rate and rhythm, S1, S2 normal, no murmur, click, rub or gallop and normal apical impulse Lungs: diminished breath sounds bilaterally Abdomen: soft, non-tender; bowel sounds normal; no masses,  no organomegaly Extremities: extremities normal, atraumatic, no cyanosis or edema and Homans sign is negative, no sign of DVT No carotid bruits, pedal pulses are present dp & pt bilaterial  Diagnostic Studies & Laboratory data:     Recent Radiology Findings:  Ct Super D Chest Wo Contrast  05/21/2012   *RADIOLOGY REPORT*  Clinical Data:  Evaluate lung nodule.  CHEST CT WITHOUT CONTRAST  Technique:  Multidetector CT imaging of the chest was performed following the standard protocol without intravenous contrast.  Comparison:  Chest CT 01/07/2012.  Findings:  Mediastinum: Heart size is normal. There is no significant pericardial fluid, thickening or pericardial calcification. No pathologically enlarged mediastinal or hilar lymph nodes. Please note that accurate exclusion of hilar adenopathy is limited on noncontrast CT scans.  A small hiatal hernia. There is atherosclerosis of the thoracic aorta, the great vessels of the mediastinum and the coronary arteries, including calcified atherosclerotic plaque in the left anterior descending coronary artery. Calcifications of the aortic valve.  Lungs/Pleura: Again noted are multiple areas of ground-glass attenuation scattered throughout the lungs bilaterally.  Many of these are ill-defined, while others are slightly nodular in appearance.  The largest somewhat nodular area of ground-glass attenuation and reticulation is in the periphery of the right upper lobe (image 21 of series 4), which appears slightly less bulky than the prior examination 01/07/2012, currently measuring 3.3 x 1.6 cm (previously 3.1 x 1.9 cm).  Other notable lesions include a 9 mm lesion in the anterior aspect of the right upper lobe (image 25 of series 4), and a faint 1.6 x 1.7 cm lesion in the anterior aspect of the left upper lobe (image 26 of series 4), both of which appear unchanged.  There is an increasing nodular density measuring 6 mm in the posterior aspect of the right upper lobe shortly above the major fissure (image 21 of series 4), which is nonspecific, but favored to represent an area of developing pleural scarring.  Mild diffuse bronchial wall thickening with mild centrilobular and paraseptal emphysema.  Bilateral apical nodular pleural parenchymal scarring is unchanged.  No acute  consolidative airspace disease. No pleural effusions.  Upper Abdomen: Extensive atherosclerosis.  Musculoskeletal: Orthopedic fixation hardware in the lower cervical spine incompletely visualized. There are no aggressive appearing lytic or blastic lesions noted in the visualized portions of the skeleton.  A small sclerotic lesion in the inferior aspect of the right scapula is unchanged, most compatible with a bone island.  IMPRESSION: 1.  Overall, the appearance of the chest is very similar to prior examinations with multiple subsolid nodular areas scattered throughout the lungs bilaterally, as above.  The majority of these are unchanged, while the largest one in the periphery of the right upper lobe appears slightly less bulky than the prior examination. Continued attention on follow-up studies is recommended to exclude progression of disease, as slow-growing adenocarcinoma(s) are not  excluded. 2.  Atherosclerosis, including left anterior descending coronary artery disease. Assessment for potential risk factor modification, dietary therapy or pharmacologic therapy may be warranted, if clinically indicated. 3.  Mild diffuse bronchial wall thickening with mild centrilobular and paraseptal emphysema. 4.  Additional incidental findings, as above.   Original Report Authenticated By: Trudie Reed, M.D.    Ct Chest Wo Contrast  01/07/2012  *RADIOLOGY REPORT*  Clinical Data: Follow-up pulmonary nodules.  History of chronic airway obstruction and aspiration pneumonia.  No history of malignancy.  CT CHEST WITHOUT CONTRAST  Technique:  Multidetector CT imaging of the chest was performed following the standard protocol without IV contrast.  Comparison: Prior chest CTs dated 10/02/2011 and 07/27/2011.  Findings: The mediastinum has a stable appearance with unchanged normal sinus mediastinal lymph nodes.  There is scattered atherosclerosis of the great vessels, aorta and coronary arteries. There is no pleural or  pericardial effusion.  Compared with the original study, the airway thickening and patchy peribronchial opacities within the right lower lobe have resolved. These were likely inflammatory.  However, there are several persistent mixed solid and ground-glass densities.  The largest is positioned peripherally in the right upper lobe and measures approximately 3.1 x 1.9 cm on image 19.  A 9 mm mixed ground-glass opacity in the right upper lobe on image 23 has slightly enlarged. A faint pure ground-glass opacity in the right lower lobe on image 39 is stable.  Likewise, there is a stable ground-glass opacity in the left upper lobe on image 21.  Biapical scarring is stable.  The visualized upper abdomen has a stable appearance.  There are multiple calcified lymph nodes within the porta hepatis.  IMPRESSION:  1.  Compared with the most recent examination of 3 months ago, no significant changes are identified.  No acute process is seen. 2.  There are persistent mixed solid and ground-glass opacities in the right upper lobe as well as additional pure ground-glass opacities.  Although possibly postinflammatory, adenocarcinoma cannot be excluded. Thoracic surgical consultation should be considered.  Followup by CT is recommended in 12 months, with continued annual surveillance for a minimum of 3 years.  These recommendations are taken from "Recommendations for the Management of Subsolid Pulmonary Nodules Detected at CT:  A Statement from the Fleischner Society" Radiology 2013; 266:1, 304- 317.   Original Report Authenticated By: Carey Bullocks, M.D.     Recent Lab Findings: Lab Results  Component Value Date   WBC 11.3* 08/01/2011   HGB 13.3 08/01/2011   HCT 41.5 08/01/2011   PLT PLATELET CLUMPS NOTED ON SMEAR, COUNT APPEARS ADEQUATE 08/01/2011   GLUCOSE 100* 08/01/2011   ALT 12 07/26/2011   AST 16 07/26/2011   NA 133* 08/01/2011   K 3.9 08/01/2011   CL 96 08/01/2011   CREATININE 0.85 08/01/2011   BUN 7 08/01/2011   CO2 33*  08/01/2011   TSH 1.631 07/28/2011   HGBA1C 6.0* 07/27/2011      Assessment / Plan:   History of long-term tobacco use with multiple pulmonary nodules, Overall, the appearance of the chest is very similar to prior examinations with multiple subsolid nodular areas scattered throughout the lungs bilaterally 77 year old male, long term smoker with bilateral ground glass opacities stable radiographically since May of 2013. Cannot rule out bronchial alveolar carcinoma but since stable over that time, will continue to monitor.  PET scan not likely to help.   Discussed the CT findings with the patient and his wife. Will plan to see  back in 6 months with a follow up CT of the chest.     Delight Ovens MD  Beeper [phone] Office [phone] 01/23/2012

## 2012-07-06 ENCOUNTER — Ambulatory Visit (INDEPENDENT_AMBULATORY_CARE_PROVIDER_SITE_OTHER): Payer: Medicare Other | Admitting: Adult Health

## 2012-07-06 ENCOUNTER — Encounter: Payer: Self-pay | Admitting: Adult Health

## 2012-07-06 VITALS — BP 154/85 | HR 74 | Ht 72.0 in | Wt 145.0 lb

## 2012-07-06 DIAGNOSIS — I4891 Unspecified atrial fibrillation: Secondary | ICD-10-CM

## 2012-07-06 DIAGNOSIS — R799 Abnormal finding of blood chemistry, unspecified: Secondary | ICD-10-CM | POA: Diagnosis not present

## 2012-07-06 DIAGNOSIS — I10 Essential (primary) hypertension: Secondary | ICD-10-CM

## 2012-07-06 DIAGNOSIS — E7800 Pure hypercholesterolemia, unspecified: Secondary | ICD-10-CM | POA: Diagnosis not present

## 2012-07-06 DIAGNOSIS — R7989 Other specified abnormal findings of blood chemistry: Secondary | ICD-10-CM

## 2012-07-06 LAB — COMPREHENSIVE METABOLIC PANEL
AST: 17 U/L (ref 0–37)
Albumin: 4.4 g/dL (ref 3.5–5.2)
Alkaline Phosphatase: 93 U/L (ref 39–117)
Glucose, Bld: 87 mg/dL (ref 70–99)
Potassium: 4.5 mEq/L (ref 3.5–5.3)
Sodium: 135 mEq/L (ref 135–145)
Total Protein: 7.3 g/dL (ref 6.0–8.3)

## 2012-07-06 LAB — LIPID PANEL: LDL Cholesterol: 93 mg/dL (ref 0–99)

## 2012-07-06 LAB — CBC
Hemoglobin: 16.1 g/dL (ref 13.0–17.0)
MCHC: 33.7 g/dL (ref 30.0–36.0)
RDW: 12.8 % (ref 11.5–15.5)

## 2012-07-06 NOTE — Assessment & Plan Note (Signed)
He is now in NSR with 1st degree AV block. Rate is controlled. I will check a dig level as the last one was found to be subtherapuetic. He does not see Halos but is complaining of some blurred vision at times.  Continue dilt and ASA.

## 2012-07-06 NOTE — Assessment & Plan Note (Signed)
Remains on statin. Check fasting lipids and LFTs.

## 2012-07-06 NOTE — Progress Notes (Signed)
HPI [name] is a very pleasant 77 y/o male patient of Dr.Rothbart we are seeing on 6 month follow up. He has a history of PAF, is on dig and diltiazem, and on ASA. He stopped smoking one year ago, and uses an electronic cigarette.   On last visit the patient had echocardiogram completed demonstrating normal LV function range 55-60%, without significant valvular abnormalities. Digoxin level was down to below 1.3. He is followed by Dr. Lowella Fairy, ongoing assessment of lungs secondary to abnormal CT scan.Watch and wait at this time.  He offers know complaints but some blurred vision at times. No chest pain, palpitations, or DOE.   Allergies  Allergen Reactions  . Celebrex (Celecoxib)   . Tetracyclines & Related Hives    Current Outpatient Prescriptions  Medication Sig Dispense Refill  . alprazolam (XANAX) 2 MG tablet Take 2 mg by mouth 3 (three) times daily.      Marland Kitchen aspirin EC 81 MG tablet Take 81 mg by mouth daily.      . digoxin (LANOXIN) 0.25 MG tablet Take 1 tablet (0.25 mg total) by mouth daily.  90 tablet  6  . diltiazem (CARDIZEM CD) 240 MG 24 hr capsule Take 1 capsule (240 mg total) by mouth daily. Medication for your blood pressure and her heart rate.  90 capsule  6  . Multiple Vitamins-Minerals (MULTIVITAMIN WITH MINERALS) tablet Take 1 tablet by mouth daily.      . Omega-3 Fatty Acids (FISH OIL) 1000 MG CAPS Take 1 capsule by mouth daily.      Marland Kitchen PROAIR HFA 108 (90 BASE) MCG/ACT inhaler Inhale into the lungs as needed.       . simvastatin (ZOCOR) 20 MG tablet Take 20 mg by mouth daily.       No current facility-administered medications for this visit.    Past Medical History  Diagnosis Date  . Hypertension   . High cholesterol   . Anxiety   . Cancer 1993    colon  . Rapid atrial fibrillation 07/28/2011    Paroxysmal, newly diagnosed.  . Cavitary lesion of lung 07/28/2011    AFB smears x3 negative.  . Acute respiratory failure with hypoxia 07/26/2011  . Bulla of lung  07/28/2011  . Tobacco abuse 08/01/2011  . COPD (chronic obstructive pulmonary disease)     Past Surgical History  Procedure Laterality Date  . Colon surgery    . Cervical disc surgery    . Tonsillectomy    . Appendectomy      AVW:UJWJXB of systems complete and found to be negative unless listed above  PHYSICAL EXAM BP 154/85  Pulse 74  Ht 6' (1.829 m)  Wt 145 lb (65.772 kg)  BMI 19.66 kg/m2  General: Well developed, well nourished, in no acute distress Head: Eyes PERRLA, No xanthomas.   Normal cephalic and atramatic  Lungs: Intermittent wheezes cleared with cough. Heart: HRRR S1 S2, without MRG.  Pulses are 2+ & equal.            No carotid bruit. No JVD.  No abdominal bruits. No femoral bruits. Abdomen: Bowel sounds are positive, abdomen soft and non-tender without masses or                  Hernia's noted. Msk:  Back normal, normal gait. Normal strength and tone for age. Extremities: No clubbing, cyanosis non-pitting edema.  DP +1 Neuro: Alert and oriented X 3. Psych:  Good affect, responds appropriately  JYN:WGNFA rhythm with 1st  degree AV block. Rate of 76 bpm.  ASSESSMENT AND PLAN

## 2012-07-06 NOTE — Assessment & Plan Note (Signed)
Mild elevation today, but stable for him. Continue meds as directed. Labs to include CMET

## 2012-07-06 NOTE — Progress Notes (Signed)
Name: Adam Ward    DOB: 19-Apr-1935  Age: 77 y.o.  MR#: [id number]       PCP:  Colette Ribas, MD      Insurance: Payor: MEDICARE  Plan: MEDICARE PART A AND B  Product Type: *No Product type*    CC:    Chief Complaint  Patient presents with  . Atrial Fibrillation    VS Filed Vitals:   07/06/12 1126  BP: 154/85  Pulse: 74  Height: 6' (1.829 m)  Weight: 145 lb (65.772 kg)    Weights Current Weight  07/06/12 145 lb (65.772 kg)  05/21/12 150 lb (68.04 kg)  01/23/12 150 lb (68.04 kg)    Blood Pressure  BP Readings from Last 3 Encounters:  07/06/12 154/85  05/21/12 164/77  01/23/12 152/67     Admit date:  (Not on file) Last encounter with RMR:  01/08/2012   Allergy Celebrex and Tetracyclines & related  Current Outpatient Prescriptions  Medication Sig Dispense Refill  . alprazolam (XANAX) 2 MG tablet Take 2 mg by mouth 3 (three) times daily.      Marland Kitchen aspirin EC 81 MG tablet Take 81 mg by mouth daily.      . digoxin (LANOXIN) 0.25 MG tablet Take 1 tablet (0.25 mg total) by mouth daily.  90 tablet  6  . diltiazem (CARDIZEM CD) 240 MG 24 hr capsule Take 1 capsule (240 mg total) by mouth daily. Medication for your blood pressure and her heart rate.  90 capsule  6  . Multiple Vitamins-Minerals (MULTIVITAMIN WITH MINERALS) tablet Take 1 tablet by mouth daily.      . Omega-3 Fatty Acids (FISH OIL) 1000 MG CAPS Take 1 capsule by mouth daily.      Marland Kitchen PROAIR HFA 108 (90 BASE) MCG/ACT inhaler Inhale into the lungs as needed.       . simvastatin (ZOCOR) 20 MG tablet Take 20 mg by mouth daily.       No current facility-administered medications for this visit.    Discontinued Meds:   There are no discontinued medications.  Patient Active Problem List   Diagnosis Date Noted  . Tobacco abuse 08/01/2011  . Acute bronchitis 07/28/2011  . Lung nodules 07/28/2011  . Bulla of lung 07/28/2011  . Cavitary lesion of lung 07/28/2011  . Rapid atrial fibrillation 07/28/2011  .  Hyperglycemia 07/27/2011  . Elevated brain natriuretic peptide (BNP) level 07/27/2011  . CAP (community acquired pneumonia) 07/26/2011  . Acute respiratory failure with hypoxia 07/26/2011  . Hypertension   . Anxiety     LABS    Component Value Date/Time   NA 133* 08/01/2011 0517   NA 133* 07/30/2011 0451   NA 137 07/28/2011 0414   K 3.9 08/01/2011 0517   K 3.8 07/30/2011 0451   K 3.9 07/28/2011 0414   CL 96 08/01/2011 0517   CL 94* 07/30/2011 0451   CL 98 07/28/2011 0414   CO2 33* 08/01/2011 0517   CO2 33* 07/30/2011 0451   CO2 33* 07/28/2011 0414   GLUCOSE 100* 08/01/2011 0517   GLUCOSE 119* 07/30/2011 0451   GLUCOSE 141* 07/28/2011 0414   BUN 7 08/01/2011 0517   BUN 5* 07/30/2011 0451   BUN 12 07/28/2011 0414   CREATININE 0.85 08/01/2011 0517   CREATININE 0.72 07/30/2011 0451   CREATININE 0.80 07/28/2011 0414   CALCIUM 8.5 08/01/2011 0517   CALCIUM 8.5 07/30/2011 0451   CALCIUM 8.5 07/28/2011 0414   GFRNONAA 84* 08/01/2011 0517  GFRNONAA >90 07/30/2011 0451   GFRNONAA 87* 07/28/2011 0414   GFRAA >90 08/01/2011 0517   GFRAA >90 07/30/2011 0451   GFRAA >90 07/28/2011 0414   CMP     Component Value Date/Time   NA 133* 08/01/2011 0517   K 3.9 08/01/2011 0517   CL 96 08/01/2011 0517   CO2 33* 08/01/2011 0517   GLUCOSE 100* 08/01/2011 0517   BUN 7 08/01/2011 0517   CREATININE 0.85 08/01/2011 0517   CALCIUM 8.5 08/01/2011 0517   PROT 6.9 07/26/2011 1920   ALBUMIN 2.9* 07/26/2011 1920   AST 16 07/26/2011 1920   ALT 12 07/26/2011 1920   ALKPHOS 70 07/26/2011 1920   BILITOT 0.2* 07/26/2011 1920   GFRNONAA 84* 08/01/2011 0517   GFRAA >90 08/01/2011 0517       Component Value Date/Time   WBC 11.3* 08/01/2011 0517   WBC 10.9* 07/30/2011 0451   WBC 11.3* 07/28/2011 0414   HGB 13.3 08/01/2011 0517   HGB 13.2 07/30/2011 0451   HGB 12.7* 07/28/2011 0414   HCT 41.5 08/01/2011 0517   HCT 39.5 07/30/2011 0451   HCT 39.4 07/28/2011 0414   MCV 94.7 08/01/2011 0517   MCV 92.3 07/30/2011 0451   MCV 96.1 07/28/2011 0414     Lipid Panel  No results found for this basename: chol, trig, hdl, cholhdl, vldl, ldlcalc    ABG No results found for this basename: phart, pco2, pco2art, po2, po2art, hco3, tco2, acidbasedef, o2sat     Lab Results  Component Value Date   TSH 1.631 07/28/2011   BNP (last 3 results)  Recent Labs  07/26/11 1920  PROBNP 2495.0*   Cardiac Panel (last 3 results) No results found for this basename: CKTOTAL, CKMB, TROPONINI, RELINDX,  in the last 72 hours  Iron/TIBC/Ferritin No results found for this basename: iron, tibc, ferritin     EKG Orders placed in visit on 07/06/12  . EKG 12-LEAD     Prior Assessment and Plan Problem List as of 07/06/2012     ICD-9-CM   CAP (community acquired pneumonia)   Acute respiratory failure with hypoxia   Hypertension   Last Assessment & Plan   01/08/2012 Office Visit Written 01/08/2012  3:02 PM by Jodelle Gross, NP     Good control of BP today. Will not make any changes at this time to his medications. He will see [place] again in 6 months unless he is symptomatic.    Anxiety   Hyperglycemia   Elevated brain natriuretic peptide (BNP) level   Acute bronchitis   Lung nodules   Last Assessment & Plan   01/08/2012 Office Visit Written 01/08/2012  3:04 PM by Jodelle Gross, NP     CT scan completed on 01/07/2012 is reviewed.  It is unchanged from CT scan completed in May of 2013. It is recommended that he have follow up CT in 12 months. This will be managed by Dr. Phillips Odor.    Bulla of lung   Cavitary lesion of lung   Rapid atrial fibrillation   Last Assessment & Plan   01/08/2012 Office Visit Written 01/08/2012  3:01 PM by Jodelle Gross, NP     He remains in NSR, without complaint of recurrence of palpitations or rapid HR. He has gained 20 lbs and has much more energy. He asked about being taken off of the medication for Atrial fib. I have explained to him that with his age, it is best for him to continue on this  medication to prevent and  further recurrence. He had an echocardiogram ordered while in the hospital but it was inadvertently cancelled. Will have this completed.  Will also have labs for digoxin level done. He had chemistries drawn last week per Dr. Phillips Odor . Will request copies of them.     Tobacco abuse   Last Assessment & Plan   01/08/2012 Office Visit Written 01/08/2012  3:02 PM by Jodelle Gross, NP     He has stopped smoking for one year.          Imaging: No results found.

## 2012-07-06 NOTE — Patient Instructions (Addendum)
Your physician recommends that you schedule a follow-up appointment in: 6 MONTHS  Your physician recommends that you return for lab work in: TODAY- (DIG LEVEL,CMET,LIPID PANEL, CBC-SLIPS GIVEN)

## 2012-07-07 LAB — DIGOXIN LEVEL: Digoxin Level: 2 ng/mL (ref 0.8–2.0)

## 2012-07-23 DIAGNOSIS — E785 Hyperlipidemia, unspecified: Secondary | ICD-10-CM | POA: Diagnosis not present

## 2012-07-23 DIAGNOSIS — R7301 Impaired fasting glucose: Secondary | ICD-10-CM | POA: Diagnosis not present

## 2012-07-23 DIAGNOSIS — I10 Essential (primary) hypertension: Secondary | ICD-10-CM | POA: Diagnosis not present

## 2012-07-23 DIAGNOSIS — IMO0002 Reserved for concepts with insufficient information to code with codable children: Secondary | ICD-10-CM | POA: Diagnosis not present

## 2012-10-19 DIAGNOSIS — Z85038 Personal history of other malignant neoplasm of large intestine: Secondary | ICD-10-CM | POA: Diagnosis not present

## 2012-10-19 DIAGNOSIS — R634 Abnormal weight loss: Secondary | ICD-10-CM | POA: Diagnosis not present

## 2012-11-02 ENCOUNTER — Other Ambulatory Visit: Payer: Self-pay

## 2012-11-02 DIAGNOSIS — Z961 Presence of intraocular lens: Secondary | ICD-10-CM | POA: Diagnosis not present

## 2012-11-02 DIAGNOSIS — D381 Neoplasm of uncertain behavior of trachea, bronchus and lung: Secondary | ICD-10-CM

## 2012-11-25 ENCOUNTER — Encounter: Payer: Self-pay | Admitting: Cardiothoracic Surgery

## 2012-11-25 ENCOUNTER — Ambulatory Visit (INDEPENDENT_AMBULATORY_CARE_PROVIDER_SITE_OTHER): Payer: Medicare Other | Admitting: Cardiothoracic Surgery

## 2012-11-25 ENCOUNTER — Ambulatory Visit
Admission: RE | Admit: 2012-11-25 | Discharge: 2012-11-25 | Disposition: A | Discharge status: Home / Self Care | Payer: Medicare Other | Source: Ambulatory Visit | Attending: Cardiothoracic Surgery | Admitting: Cardiothoracic Surgery

## 2012-11-25 VITALS — BP 145/77 | HR 70 | Resp 18 | Ht 72.0 in | Wt 145.0 lb

## 2012-11-25 DIAGNOSIS — D381 Neoplasm of uncertain behavior of trachea, bronchus and lung: Secondary | ICD-10-CM

## 2012-11-25 DIAGNOSIS — R918 Other nonspecific abnormal finding of lung field: Secondary | ICD-10-CM | POA: Diagnosis not present

## 2012-11-25 DIAGNOSIS — J438 Other emphysema: Secondary | ICD-10-CM | POA: Diagnosis not present

## 2012-11-25 NOTE — Progress Notes (Signed)
[address]       [place] [address]             [phone]           Katheren Shams [facility]ong [facility] Medical Record #[id number] Date of Birth: December 26, 1935  Referring: Corrie Mckusick, MD Primary Care: Colette Ribas, MD  Chief Complaint:    Chief Complaint  Patient presents with  . Lung Lesion    6 month f/u with Chest CT    History of Present Illness:    The patient is an elderly 77 year old male, referred because of abnormal CT scan of the chest. He was treated in the spring of 2013 for a COPD exacerbation and question of tuberculosis. He notes exposure to someone with tuberculosis in 1943.  Cultures were negative but CT scan showed multiple bilateral ground glass densities. Follow up scans were done in August of 2013 and January 07, 2012. He comes in today with  followup CT scan of the chest . The patient has been a smoker for more than 50 years just recently switching to electronic cigarettes.   Since last seen he continues not to smoke complains mostly of his legs getting weak before he gets short of breath. Patient pulmonary reserve these in great limited, physically he has difficulty with balance and ambulation, he appears to be a very poor candidate for any surgical intervention or pulmonary resection.  Current Activity/ Functional Status: Patient is independent with mobility/ambulation, transfers, ADL's, IADL's.    Past Medical History  Diagnosis Date  . Hypertension   . High cholesterol   . Anxiety   . Cancer 1993    colon  . Rapid atrial fibrillation 07/28/2011    Paroxysmal, newly diagnosed.  . Cavitary lesion of lung 07/28/2011    AFB smears x3 negative.  . Acute respiratory failure with hypoxia 07/26/2011  . Bulla of lung 07/28/2011  . Tobacco abuse 08/01/2011  . COPD (chronic obstructive pulmonary disease)     Past Surgical History  Procedure Laterality Date  . Colon surgery    . Cervical disc surgery    .  Tonsillectomy    . Appendectomy      Family History  Problem Relation Age of Onset  . Heart disease Mother   . Stroke Father   . Pneumonia Sister   . Heart disease Brother     History   Social History  . Marital Status: Married    Spouse Name: N/A    Number of Children: N/A  . Years of Education: N/A   Occupational History  . Worked for [employer]   Social History Main Topics  . Smoking status: Former Smoker    Quit date: 07/16/2011  . Smokeless tobacco: Not on file     Comment: started using electric ciggerette in 06-2010  . Alcohol Use: No  . Drug Use: No  . Sexually Active: Not on file      History  Smoking status  . Former Smoker  . Quit date: 07/16/2011  Smokeless tobacco  . Not on file    Comment: started using electric ciggerette in 06-2010    History  Alcohol Use No     Allergies  Allergen Reactions  . Celebrex [Celecoxib]   . Tetracyclines & Related Hives    Current Outpatient Prescriptions  Medication Sig Dispense Refill  . alprazolam (  XANAX) 2 MG tablet Take 2 mg by mouth 3 (three) times daily.      Marland Kitchen aspirin EC 81 MG tablet Take 81 mg by mouth daily.      . digoxin (LANOXIN) 0.25 MG tablet Take 1 tablet (0.25 mg total) by mouth daily.  90 tablet  6  . diltiazem (CARDIZEM CD) 240 MG 24 hr capsule Take 1 capsule (240 mg total) by mouth daily. Medication for your blood pressure and her heart rate.  90 capsule  6  . Multiple Vitamins-Minerals (MULTIVITAMIN WITH MINERALS) tablet Take 1 tablet by mouth daily.      . Omega-3 Fatty Acids (FISH OIL) 1000 MG CAPS Take 1 capsule by mouth daily.      Marland Kitchen PROAIR HFA 108 (90 BASE) MCG/ACT inhaler Inhale into the lungs as needed.       . simvastatin (ZOCOR) 20 MG tablet Take 20 mg by mouth daily.       No current facility-administered medications for this visit.       Review of Systems:     Cardiac Review of Systems: Y or N  Chest Pain [ n   ]  Resting SOB [ n  ] Exertional SOB  [ y ]    Pollyann Kennedy Milo.Brash  ]   Pedal Edema [  n ]    Palpitations [n  ] Syncope  [ n ]   Presyncope n   ]  General Review of Systems: [Y] = yes [  ]=no Constitional: recent weight change [n  ]; anorexia [ n ]; fatigue [ n ]; nausea [  ]; night sweats [ n ]; fever [ n ]; or chills [ n ];                                                                                                                                            Eye : blurred vision [  ]; diplopia [   ]; vision changes [  ];  Amaurosis fugax[  ]; Resp: cough [ y ];  wheezing[y  ];  hemoptysis[n  ]; shortness of breath[  ]; paroxysmal nocturnal dyspnea[  ]; dyspnea on exertion[ y ]; or orthopnea[  ];  GI:  gallstones[ n ], vomiting[  ];  dysphagia[  ]; melena[  ];  hematochezia [  ]; heartburn[  ];   Hx of  Colonoscopy[  ]; GU: kidney stones [  ]; hematuria[  ];   dysuria [  ];  nocturia[  ];  history of     obstruction [  ];             Skin: rash, swelling[  ];, hair loss[  ];  peripheral edema[  ];  or itching[  ]; Musculosketetal: myalgias[  ];  joint swelling[  ];  joint erythema[  ];  joint pain[  ];  back pain[  ];  Heme/Lymph: bruising[  ];  bleeding[  ];  anemia[  ];  Neuro: TIA[  ];  headaches[  ];  stroke[  ];  vertigo[  ];  seizures[  ];   paresthesias[  ];  difficulty walking[  ];  Psych:depression[  ]; anxiety[  ];  Endocrine: diabetes[ y ];  thyroid dysfunction[  ];  Immunizations: Flu [ y ]; Pneumococcal[y  ]; shingles Y  Other:  Physical Exam: BP 145/77  Pulse 70  Resp 18  Ht 6' (1.829 m)  Wt 145 lb (65.772 kg)  BMI 19.66 kg/m2  SpO2 96%  General appearance: alert, cooperative, appears older than stated age, distracted and no distress Neurologic: intact Heart: regular rate and rhythm, S1, S2 normal, no murmur, click, rub or gallop and normal apical impulse Lungs: diminished breath sounds bilaterally Abdomen: soft, non-tender; bowel sounds normal; no masses,  no organomegaly Extremities: extremities normal, atraumatic,  no cyanosis or edema and Homans sign is negative, no sign of DVT No carotid bruits, pedal pulses are present dp & pt bilaterial  Diagnostic Studies & Laboratory data:     Recent Radiology Findings:  Ct Chest Wo Contrast  11/25/2012   CLINICAL DATA:  Followup of pulmonary nodules. Ex-smoker. History of remote colon cancer.  EXAM: CT CHEST WITHOUT CONTRAST  TECHNIQUE: Multidetector CT imaging of the chest was performed following the standard protocol without IV contrast.  COMPARISON:  05/21/2012 and back to 07/27/2011.  FINDINGS: Lungs/Pleura: Moderate centrilobular emphysema. Biapical pleural parenchymal scarring.  Peripheral right upper lobe ground-glass nodule measures 2.3 x 3.3 cm on image 23/series 4. 1.6 x 3.3 cm at the same level on the prior exam. Appears similar on today's sagittal image 30, and grossly similar on 01/07/2012.  Sub solid right upper lobe nodule which measures 1.0 x 1.2 cm on image 27 versus 0.9 x 1.0 cm on the prior exam. Definitely enlarged since 07/27/2011.  Left upper lobe central ground-glass nodule which measures on the order of 1.3 x 1.6 cm on image 25/series 4. 1.6 x 1.7 cm on the prior exam, suggesting stability.  Scattered areas of scarring, some of which are somewhat nodular. No pleural fluid.  Heart/Mediastinum: Dense aortic and great vessel atherosclerosis. Normal heart size, without pericardial effusion. Coronary artery atherosclerosis. Small stable middle mediastinal and prevascular nodes, without mediastinal or hilar adenopathy.  Upper abdomen: Advanced atherosclerosis. Normal adrenal glands.  Bones/Musculoskeletal: Lower cervical spine fixation. No acute osseous abnormality.  IMPRESSION: 1. Multiple sub solid pulmonary nodules. An anterior right upper lobe nodule has definitely enlarged when compared back to 07/27/2011. This is suspicious for an indolent neoplasm such is low-grade adenocarcinoma. Presuming this is not sampled or resected, recommend special attention on  followup. 2. The more peripheral right upper lobe sub solid lesion measures slightly larger today, favored to be due to differences in slice selection. This also warrants special followup attention. 3. Atherosclerosis, including within the coronary arteries. 4. Centrilobular emphysema.   Electronically Signed   By: Jeronimo Greaves   On: 11/25/2012 14:42   Ct Super D Chest Wo Contrast  05/21/2012  *RADIOLOGY REPORT*  Clinical Data:  Evaluate lung nodule.  CHEST CT WITHOUT CONTRAST  Technique:  Multidetector CT imaging of the chest was performed following the standard protocol without intravenous contrast.  Comparison:  Chest CT 01/07/2012.  Findings:  Mediastinum: Heart size is normal. There is no significant pericardial fluid, thickening or pericardial calcification. No pathologically enlarged mediastinal or hilar lymph nodes. Please note that accurate exclusion of hilar  adenopathy is limited on noncontrast CT scans.  A small hiatal hernia. There is atherosclerosis of the thoracic aorta, the great vessels of the mediastinum and the coronary arteries, including calcified atherosclerotic plaque in the left anterior descending coronary artery. Calcifications of the aortic valve.  Lungs/Pleura: Again noted are multiple areas of ground-glass attenuation scattered throughout the lungs bilaterally.  Many of these are ill-defined, while others are slightly nodular in appearance.  The largest somewhat nodular area of ground-glass attenuation and reticulation is in the periphery of the right upper lobe (image 21 of series 4), which appears slightly less bulky than the prior examination 01/07/2012, currently measuring 3.3 x 1.6 cm (previously 3.1 x 1.9 cm).  Other notable lesions include a 9 mm lesion in the anterior aspect of the right upper lobe (image 25 of series 4), and a faint 1.6 x 1.7 cm lesion in the anterior aspect of the left upper lobe (image 26 of series 4), both of which appear unchanged.  There is an increasing  nodular density measuring 6 mm in the posterior aspect of the right upper lobe shortly above the major fissure (image 21 of series 4), which is nonspecific, but favored to represent an area of developing pleural scarring.  Mild diffuse bronchial wall thickening with mild centrilobular and paraseptal emphysema.  Bilateral apical nodular pleural parenchymal scarring is unchanged.  No acute consolidative airspace disease. No pleural effusions.  Upper Abdomen: Extensive atherosclerosis.  Musculoskeletal: Orthopedic fixation hardware in the lower cervical spine incompletely visualized. There are no aggressive appearing lytic or blastic lesions noted in the visualized portions of the skeleton.  A small sclerotic lesion in the inferior aspect of the right scapula is unchanged, most compatible with a bone island.  IMPRESSION: 1.  Overall, the appearance of the chest is very similar to prior examinations with multiple subsolid nodular areas scattered throughout the lungs bilaterally, as above.  The majority of these are unchanged, while the largest one in the periphery of the right upper lobe appears slightly less bulky than the prior examination. Continued attention on follow-up studies is recommended to exclude progression of disease, as slow-growing adenocarcinoma(s) are not excluded. 2.  Atherosclerosis, including left anterior descending coronary artery disease. Assessment for potential risk factor modification, dietary therapy or pharmacologic therapy may be warranted, if clinically indicated. 3.  Mild diffuse bronchial wall thickening with mild centrilobular and paraseptal emphysema. 4.  Additional incidental findings, as above.   Original Report Authenticated By: Trudie Reed, M.D.    Ct Chest Wo Contrast  01/07/2012  *RADIOLOGY REPORT*  Clinical Data: Follow-up pulmonary nodules.  History of chronic airway obstruction and aspiration pneumonia.  No history of malignancy.  CT CHEST WITHOUT CONTRAST  Technique:   Multidetector CT imaging of the chest was performed following the standard protocol without IV contrast.  Comparison: Prior chest CTs dated 10/02/2011 and 07/27/2011.  Findings: The mediastinum has a stable appearance with unchanged normal sinus mediastinal lymph nodes.  There is scattered atherosclerosis of the great vessels, aorta and coronary arteries. There is no pleural or pericardial effusion.  Compared with the original study, the airway thickening and patchy peribronchial opacities within the right lower lobe have resolved. These were likely inflammatory.  However, there are several persistent mixed solid and ground-glass densities.  The largest is positioned peripherally in the right upper lobe and measures approximately 3.1 x 1.9 cm on image 19.  A 9 mm mixed ground-glass opacity in the right upper lobe on image 23 has slightly enlarged.  A faint pure ground-glass opacity in the right lower lobe on image 39 is stable.  Likewise, there is a stable ground-glass opacity in the left upper lobe on image 21.  Biapical scarring is stable.  The visualized upper abdomen has a stable appearance.  There are multiple calcified lymph nodes within the porta hepatis.  IMPRESSION:  1.  Compared with the most recent examination of 3 months ago, no significant changes are identified.  No acute process is seen. 2.  There are persistent mixed solid and ground-glass opacities in the right upper lobe as well as additional pure ground-glass opacities.  Although possibly postinflammatory, adenocarcinoma cannot be excluded. Thoracic surgical consultation should be considered.  Followup by CT is recommended in 12 months, with continued annual surveillance for a minimum of 3 years.  These recommendations are taken from "Recommendations for the Management of Subsolid Pulmonary Nodules Detected at CT:  A Statement from the Fleischner Society" Radiology 2013; 266:1, 304- 317.   Original Report Authenticated By: Carey Bullocks, M.D.      Recent Lab Findings: Lab Results  Component Value Date   WBC 7.5 07/06/2012   HGB 16.1 07/06/2012   HCT 47.8 07/06/2012   PLT 185 07/06/2012   GLUCOSE 87 07/06/2012   CHOL 160 07/06/2012   TRIG 97 07/06/2012   HDL 48 07/06/2012   LDLCALC 93 07/06/2012   ALT 9 07/06/2012   AST 17 07/06/2012   NA 135 07/06/2012   K 4.5 07/06/2012   CL 92* 07/06/2012   CREATININE 0.96 07/06/2012   BUN 7 07/06/2012   CO2 31 07/06/2012   TSH 1.631 07/28/2011   HGBA1C 6.0* 07/27/2011      Assessment / Plan:   History of long-term tobacco use with multiple pulmonary nodules, Overall, the appearance of the chest is very similar to prior examinations with multiple subsolid nodular areas scattered throughout the lungs bilaterally, one lesion in the right upper lobe appears approximately 2-3 mm larger than last year.  77 year old male, long term smoker with bilateral ground glass opacities present at least since May of 2013. There is some slight enlargement several millimeters and a right upper lobe lesion. I Cannot rule out bronchial alveolar carcinoma, this has been explained to the patient. After discussion with the patient was decided to wait another 6 month interval and see which nodules demonstrate the most change and then consider needle biopsy. The patient would not be a candidate for surgical resection but could be considered for stereotactic radiotherapy.  Discussed the CT findings with the patient and his wife. Will plan to see back in 6 months with a follow up CT of the chest.     Delight Ovens MD  Beeper [phone] Office [phone]

## 2012-12-22 DIAGNOSIS — Z23 Encounter for immunization: Secondary | ICD-10-CM | POA: Diagnosis not present

## 2013-01-04 ENCOUNTER — Ambulatory Visit: Payer: Medicare Other | Admitting: Adult Health

## 2013-01-04 ENCOUNTER — Ambulatory Visit (INDEPENDENT_AMBULATORY_CARE_PROVIDER_SITE_OTHER): Payer: Medicare Other | Admitting: Adult Health

## 2013-01-04 VITALS — BP 145/70 | HR 79 | Ht 73.0 in | Wt 143.0 lb

## 2013-01-04 DIAGNOSIS — N19 Unspecified kidney failure: Secondary | ICD-10-CM | POA: Diagnosis not present

## 2013-01-04 DIAGNOSIS — I4891 Unspecified atrial fibrillation: Secondary | ICD-10-CM | POA: Diagnosis not present

## 2013-01-04 NOTE — Progress Notes (Deleted)
Name: Adam Ward    DOB: Jun 02, 1935  Age: 77 y.o.  MR#: [id number]       PCP:  Colette Ribas, MD      Insurance: Payor: MEDICARE / Plan: MEDICARE PART A AND B / Product Type: *No Product type* /   CC:    Chief Complaint  Patient presents with  . Atrial Fibrillation  . Hypertension    VS Filed Vitals:   01/04/13 1541  BP: 145/70  Pulse: 79  Height: 6\' 1"  (1.854 m)  Weight: 143 lb (64.864 kg)    Weights Current Weight  01/04/13 143 lb (64.864 kg)  11/25/12 145 lb (65.772 kg)  07/06/12 145 lb (65.772 kg)    Blood Pressure  BP Readings from Last 3 Encounters:  01/04/13 145/70  11/25/12 145/77  07/06/12 154/85     Admit date:  (Not on file) Last encounter with RMR:  Visit date not found   Allergy Celebrex and Tetracyclines & related  Current Outpatient Prescriptions  Medication Sig Dispense Refill  . alprazolam (XANAX) 2 MG tablet Take 2 mg by mouth 3 (three) times daily.      Marland Kitchen aspirin EC 81 MG tablet Take 81 mg by mouth daily.      . digoxin (LANOXIN) 0.25 MG tablet Take 1 tablet (0.25 mg total) by mouth daily.  90 tablet  6  . diltiazem (CARDIZEM CD) 240 MG 24 hr capsule Take 1 capsule (240 mg total) by mouth daily. Medication for your blood pressure and her heart rate.  90 capsule  6  . Multiple Vitamins-Minerals (MULTIVITAMIN WITH MINERALS) tablet Take 1 tablet by mouth daily.      . Omega-3 Fatty Acids (FISH OIL) 1000 MG CAPS Take 1 capsule by mouth daily.      Marland Kitchen PROAIR HFA 108 (90 BASE) MCG/ACT inhaler Inhale into the lungs as needed.       . simvastatin (ZOCOR) 20 MG tablet Take 20 mg by mouth daily.       No current facility-administered medications for this visit.    Discontinued Meds:   There are no discontinued medications.  Patient Active Problem List   Diagnosis Date Noted  . Pure hypercholesterolemia 07/06/2012  . Tobacco abuse 08/01/2011  . Acute bronchitis 07/28/2011  . Lung nodules 07/28/2011  . Bulla of lung 07/28/2011  . Cavitary  lesion of lung 07/28/2011  . Rapid atrial fibrillation 07/28/2011  . Hyperglycemia 07/27/2011  . Elevated brain natriuretic peptide (BNP) level 07/27/2011  . CAP (community acquired pneumonia) 07/26/2011  . Acute respiratory failure with hypoxia 07/26/2011  . Hypertension   . Anxiety     LABS    Component Value Date/Time   NA 135 07/06/2012 1220   NA 133* 08/01/2011 0517   NA 133* 07/30/2011 0451   K 4.5 07/06/2012 1220   K 3.9 08/01/2011 0517   K 3.8 07/30/2011 0451   CL 92* 07/06/2012 1220   CL 96 08/01/2011 0517   CL 94* 07/30/2011 0451   CO2 31 07/06/2012 1220   CO2 33* 08/01/2011 0517   CO2 33* 07/30/2011 0451   GLUCOSE 87 07/06/2012 1220   GLUCOSE 100* 08/01/2011 0517   GLUCOSE 119* 07/30/2011 0451   BUN 7 07/06/2012 1220   BUN 7 08/01/2011 0517   BUN 5* 07/30/2011 0451   CREATININE 0.96 07/06/2012 1220   CREATININE 0.85 08/01/2011 0517   CREATININE 0.72 07/30/2011 0451   CREATININE 0.80 07/28/2011 0414   CALCIUM 9.5 07/06/2012 1220  CALCIUM 8.5 08/01/2011 0517   CALCIUM 8.5 07/30/2011 0451   GFRNONAA 84* 08/01/2011 0517   GFRNONAA >90 07/30/2011 0451   GFRNONAA 87* 07/28/2011 0414   GFRAA >90 08/01/2011 0517   GFRAA >90 07/30/2011 0451   GFRAA >90 07/28/2011 0414   CMP     Component Value Date/Time   NA 135 07/06/2012 1220   K 4.5 07/06/2012 1220   CL 92* 07/06/2012 1220   CO2 31 07/06/2012 1220   GLUCOSE 87 07/06/2012 1220   BUN 7 07/06/2012 1220   CREATININE 0.96 07/06/2012 1220   CREATININE 0.85 08/01/2011 0517   CALCIUM 9.5 07/06/2012 1220   PROT 7.3 07/06/2012 1220   ALBUMIN 4.4 07/06/2012 1220   AST 17 07/06/2012 1220   ALT 9 07/06/2012 1220   ALKPHOS 93 07/06/2012 1220   BILITOT 0.7 07/06/2012 1220   GFRNONAA 84* 08/01/2011 0517   GFRAA >90 08/01/2011 0517       Component Value Date/Time   WBC 7.5 07/06/2012 1220   WBC 11.3* 08/01/2011 0517   WBC 10.9* 07/30/2011 0451   HGB 16.1 07/06/2012 1220   HGB 13.3 08/01/2011 0517   HGB 13.2 07/30/2011 0451   HCT 47.8 07/06/2012 1220   HCT 41.5 08/01/2011 0517    HCT 39.5 07/30/2011 0451   MCV 90.0 07/06/2012 1220   MCV 94.7 08/01/2011 0517   MCV 92.3 07/30/2011 0451    Lipid Panel     Component Value Date/Time   CHOL 160 07/06/2012 1220   TRIG 97 07/06/2012 1220   HDL 48 07/06/2012 1220   CHOLHDL 3.3 07/06/2012 1220   VLDL 19 07/06/2012 1220   LDLCALC 93 07/06/2012 1220    ABG No results found for this basename: phart, pco2, pco2art, po2, po2art, hco3, tco2, acidbasedef, o2sat     Lab Results  Component Value Date   TSH 1.631 07/28/2011   BNP (last 3 results) No results found for this basename: PROBNP,  in the last 8760 hours Cardiac Panel (last 3 results) No results found for this basename: CKTOTAL, CKMB, TROPONINI, RELINDX,  in the last 72 hours  Iron/TIBC/Ferritin No results found for this basename: iron, tibc, ferritin     EKG Orders placed in visit on 07/06/12  . EKG 12-LEAD     Prior Assessment and Plan Problem List as of 01/04/2013     Cardiovascular and Mediastinum   Hypertension   Last Assessment & Plan   07/06/2012 Office Visit Written 07/06/2012 11:53 AM by Jodelle Gross, NP     Mild elevation today, but stable for him. Continue meds as directed. Labs to include CMET    Rapid atrial fibrillation   Last Assessment & Plan   07/06/2012 Office Visit Written 07/06/2012 11:53 AM by Jodelle Gross, NP     He is now in NSR with 1st degree AV block. Rate is controlled. I will check a dig level as the last one was found to be subtherapuetic. He does not see Halos but is complaining of some blurred vision at times.  Continue dilt and ASA.      Respiratory   CAP (community acquired pneumonia)   Acute respiratory failure with hypoxia   Acute bronchitis     Other   Anxiety   Hyperglycemia   Elevated brain natriuretic peptide (BNP) level   Lung nodules   Last Assessment & Plan   01/08/2012 Office Visit Written 01/08/2012  3:04 PM by Jodelle Gross, NP     CT scan  completed on 01/07/2012 is reviewed.  It is unchanged from CT scan  completed in May of 2013. It is recommended that he have follow up CT in 12 months. This will be managed by Dr. Phillips Odor.    Bulla of lung   Cavitary lesion of lung   Tobacco abuse   Last Assessment & Plan   01/08/2012 Office Visit Written 01/08/2012  3:02 PM by Jodelle Gross, NP     He has stopped smoking for one year.      Pure hypercholesterolemia   Last Assessment & Plan   07/06/2012 Office Visit Written 07/06/2012 11:55 AM by Jodelle Gross, NP     Remains on statin. Check fasting lipids and LFTs.        Imaging: No results found.

## 2013-01-04 NOTE — Patient Instructions (Addendum)
Your physician recommends that you schedule a follow-up appointment in: 6 months  Your physician recommends that you continue on your current medications as directed. Please refer to the Current Medication list given to you today. Start using over the counter hydrocortisone cream as directed for psorisis   Your physician recommends that you return for lab work in:   as soon as possible Digoxin level , and BMET

## 2013-01-04 NOTE — Progress Notes (Signed)
    HPI: Adam Ward is a 77 year old former patient of Dr. Dietrich Pates were following for ongoing assessment and management of hypertension, atrial fibrillation, with history of hypercholesterolemia. The patient was last seen in the office in May 2014, at that time the patient was very stable without any complaints. Blood pressure is well-controlled. He remained in normal sinus rhythm without recurrence of palpitations or rapid heart rate.   He comes complaining of blurred vision, constipation, ankle edema. He denies chest pain, DOE, or dizziness. He is medically compliant.      Allergies  Allergen Reactions  . Celebrex [Celecoxib]   . Tetracyclines & Related Hives    Current Outpatient Prescriptions  Medication Sig Dispense Refill  . alprazolam (XANAX) 2 MG tablet Take 2 mg by mouth 3 (three) times daily.      Marland Kitchen aspirin EC 81 MG tablet Take 81 mg by mouth daily.      . digoxin (LANOXIN) 0.25 MG tablet Take 1 tablet (0.25 mg total) by mouth daily.  90 tablet  6  . diltiazem (CARDIZEM CD) 240 MG 24 hr capsule Take 1 capsule (240 mg total) by mouth daily. Medication for your blood pressure and her heart rate.  90 capsule  6  . Multiple Vitamins-Minerals (MULTIVITAMIN WITH MINERALS) tablet Take 1 tablet by mouth daily.      . Omega-3 Fatty Acids (FISH OIL) 1000 MG CAPS Take 1 capsule by mouth daily.      Marland Kitchen PROAIR HFA 108 (90 BASE) MCG/ACT inhaler Inhale into the lungs as needed.       . simvastatin (ZOCOR) 20 MG tablet Take 20 mg by mouth daily.       No current facility-administered medications for this visit.    Past Medical History  Diagnosis Date  . Hypertension   . High cholesterol   . Anxiety   . Cancer 1993    colon  . Rapid atrial fibrillation 07/28/2011    Paroxysmal, newly diagnosed.  . Cavitary lesion of lung 07/28/2011    AFB smears x3 negative.  . Acute respiratory failure with hypoxia 07/26/2011  . Bulla of lung 07/28/2011  . Tobacco abuse 08/01/2011  . COPD (chronic  obstructive pulmonary disease)     Past Surgical History  Procedure Laterality Date  . Colon surgery    . Cervical disc surgery    . Tonsillectomy    . Appendectomy      WUJ:WJXBJY of systems complete and found to be negative unless listed above  PHYSICAL EXAM BP 145/70  Pulse 79  Ht 6\' 1"  (1.854 m)  Wt 143 lb (64.864 kg)  BMI 18.87 kg/m2  General: Well developed, well nourished, in no acute distress Head: Eyes PERRLA, No xanthomas.   Normal cephalic and atramatic  Lungs: Expiratory Wheezes, without rales or coughing. Heart: HRRR S1 S2, without MRG.  Pulses are 2+ & equal.            No carotid bruit. No JVD.  No abdominal bruits. No femoral bruits. Abdomen: Bowel sounds are positive, abdomen soft and non-tender without masses or                  Hernia's noted. Msk:  Back normal, normal gait. Normal strength and tone for age. Extremities: No clubbing, cyanosis, with mild in the ankles., edema. Psoratic plaques on hands. DP +1 Neuro: Alert and oriented X 3. HOH Psych:  Good affect, responds appropriately  :  ASSESSMENT AND PLAN

## 2013-01-04 NOTE — Assessment & Plan Note (Signed)
Good control of BP at this time. Continue current medication regimen

## 2013-01-04 NOTE — Assessment & Plan Note (Addendum)
Heart rate is well controlled currently. He is only on ASA. Due to blurred vision, I will check a dig level and BMET. He will be seen again in 6 months. He is constipated from CCB. I have asked him to take colace at [facility] and hold for loose stools.

## 2013-01-04 NOTE — Assessment & Plan Note (Signed)
Now using electronic vapor cigarette. He is wheezing. I have asked him to use inhaler BID for the next two days.

## 2013-01-05 LAB — BASIC METABOLIC PANEL
Chloride: 98 mEq/L (ref 96–112)
Potassium: 4.3 mEq/L (ref 3.5–5.3)
Sodium: 134 mEq/L — ABNORMAL LOW (ref 135–145)

## 2013-01-06 ENCOUNTER — Encounter: Payer: Self-pay | Admitting: *Deleted

## 2013-02-14 DIAGNOSIS — E785 Hyperlipidemia, unspecified: Secondary | ICD-10-CM | POA: Diagnosis not present

## 2013-02-14 DIAGNOSIS — I10 Essential (primary) hypertension: Secondary | ICD-10-CM | POA: Diagnosis not present

## 2013-02-14 DIAGNOSIS — IMO0002 Reserved for concepts with insufficient information to code with codable children: Secondary | ICD-10-CM | POA: Diagnosis not present

## 2013-02-14 DIAGNOSIS — R7301 Impaired fasting glucose: Secondary | ICD-10-CM | POA: Diagnosis not present

## 2013-02-14 DIAGNOSIS — N419 Inflammatory disease of prostate, unspecified: Secondary | ICD-10-CM | POA: Diagnosis not present

## 2013-04-05 ENCOUNTER — Other Ambulatory Visit: Payer: Self-pay | Admitting: *Deleted

## 2013-04-05 DIAGNOSIS — D381 Neoplasm of uncertain behavior of trachea, bronchus and lung: Secondary | ICD-10-CM

## 2013-05-05 ENCOUNTER — Other Ambulatory Visit: Payer: Self-pay | Admitting: *Deleted

## 2013-05-05 ENCOUNTER — Ambulatory Visit
Admission: RE | Admit: 2013-05-05 | Discharge: 2013-05-05 | Disposition: A | Discharge status: Home / Self Care | Payer: Medicare Other | Source: Ambulatory Visit | Attending: Cardiothoracic Surgery | Admitting: Cardiothoracic Surgery

## 2013-05-05 ENCOUNTER — Encounter: Payer: Self-pay | Admitting: Cardiothoracic Surgery

## 2013-05-05 ENCOUNTER — Ambulatory Visit (INDEPENDENT_AMBULATORY_CARE_PROVIDER_SITE_OTHER): Payer: Medicare Other | Admitting: Cardiothoracic Surgery

## 2013-05-05 VITALS — BP 157/63 | HR 78 | Resp 20 | Ht 73.0 in | Wt 147.0 lb

## 2013-05-05 DIAGNOSIS — D381 Neoplasm of uncertain behavior of trachea, bronchus and lung: Secondary | ICD-10-CM

## 2013-05-05 DIAGNOSIS — R918 Other nonspecific abnormal finding of lung field: Secondary | ICD-10-CM

## 2013-05-05 DIAGNOSIS — J984 Other disorders of lung: Secondary | ICD-10-CM | POA: Diagnosis not present

## 2013-05-05 NOTE — Progress Notes (Signed)
[address]       [place] [address]             [phone]                 Elimelech A Arroyo [place] Medical Record #[id number] Date of Birth: March 16, 1935  Referring: Halford Chessman, MD Primary Care: Purvis Kilts, MD  Chief Complaint:    Chief Complaint  Patient presents with  . Lung Lesion    5 month f/u with Chest CT, surveillance of lung nodules    History of Present Illness:    The patient is an elderly 78 year old male, referred because of abnormal CT scan of the chest. He was treated in the spring of 2013 for a COPD exacerbation and question of tuberculosis. He notes exposure to someone with tuberculosis in 1943.  Cultures were negative but CT scan showed multiple bilateral ground glass densities. Follow up scans were done in August of 2013 and January 07, 2012. I first saw him 01/25/12.  The patient has been a smoker for more than 50 years just recently switching to electronic cigarettes.   Since last seen he continues not to smoke complains mostly of his legs getting weak before he gets short of breath. Patient pulmonary reserve these in great limited, physically he has difficulty with balance and ambulation, he appears to be a very poor candidate for any surgical intervention or pulmonary resection.  Followup CT scans have been performed  Current Activity/ Functional Status: Patient is independent with mobility/ambulation, transfers, ADL's, IADL's.    Past Medical History  Diagnosis Date  . Hypertension   . High cholesterol   . Anxiety   . Cancer 1993    colon  . Rapid atrial fibrillation 07/28/2011    Paroxysmal, newly diagnosed.  . Cavitary lesion of lung 07/28/2011    AFB smears x3 negative.  . Acute respiratory failure with hypoxia 07/26/2011  . Bulla of lung 07/28/2011  . Tobacco abuse 08/01/2011  . COPD (chronic obstructive pulmonary disease)     Past Surgical History  Procedure Laterality Date  . Colon surgery    .  Cervical disc surgery    . Tonsillectomy    . Appendectomy      Family History  Problem Relation Age of Onset  . Heart disease Mother   . Stroke Father   . Pneumonia Sister   . Heart disease Brother     History   Social History  . Marital Status: Married    Spouse Name: N/A    Number of Children: N/A  . Years of Education: N/A   Occupational History  . Worked for [employer]   Social History Main Topics  . Smoking status: Former Smoker    Quit date: 07/16/2011  . Smokeless tobacco: Not on file     Comment: started using electric ciggerette in 06-2010  . Alcohol Use: No  . Drug Use: No  . Sexually Active: Not on file      History  Smoking status  . Former Smoker  . Quit date: 07/16/2011  Smokeless tobacco  . Not on file    Comment: started using electric ciggerette in 06-2010    History  Alcohol Use No     Allergies  Allergen Reactions  . Celebrex [Celecoxib]   . Tetracyclines & Related Hives    Current Outpatient Prescriptions  Medication Sig Dispense Refill  . alprazolam (XANAX) 2 MG tablet  Take 2 mg by mouth 3 (three) times daily.      Marland Kitchen aspirin EC 81 MG tablet Take 81 mg by mouth daily.      Marland Kitchen docusate sodium (COLACE) 100 MG capsule Take 100 mg by mouth at bedtime.      . hydrocortisone cream 1 % Apply 1 application topically 2 (two) times daily. Apply to areas of psoriasis      . Multiple Vitamins-Minerals (MULTIVITAMIN WITH MINERALS) tablet Take 1 tablet by mouth daily.      . Omega-3 Fatty Acids (FISH OIL) 1000 MG CAPS Take 1 capsule by mouth daily.      Marland Kitchen PROAIR HFA 108 (90 BASE) MCG/ACT inhaler Inhale into the lungs as needed.       . simvastatin (ZOCOR) 20 MG tablet Take 20 mg by mouth daily.      . digoxin (LANOXIN) 0.25 MG tablet Take 1 tablet (0.25 mg total) by mouth daily.  90 tablet  6  . diltiazem (CARDIZEM CD) 240 MG 24 hr capsule Take 1 capsule (240 mg total) by mouth daily. Medication for your blood pressure and her heart rate.   90 capsule  6   No current facility-administered medications for this visit.       Review of Systems:     Cardiac Review of Systems: Y or N  Chest Pain [ n   ]  Resting SOB [ n  ] Exertional SOB  [ y ]  Vertell Limber Florencio.Farrier  ]   Pedal Edema [  n ]    Palpitations [n  ] Syncope  [ n ]   Presyncope n   ]  General Review of Systems: [Y] = yes [  ]=no Constitional: recent weight change [n  ]; anorexia [ n ]; fatigue [ n ]; nausea [  ]; night sweats [ n ]; fever [ n ]; or chills [ n ];                                                                                                                                            Eye : blurred vision [  ]; diplopia [   ]; vision changes [  ];  Amaurosis fugax[  ]; Resp: cough [ y ];  wheezing[y  ];  hemoptysis[n  ]; shortness of breath[  ]; paroxysmal nocturnal dyspnea[  ]; dyspnea on exertion[ y ]; or orthopnea[  ];  GI:  gallstones[ n ], vomiting[  ];  dysphagia[  ]; melena[  ];  hematochezia [  ]; heartburn[  ];   Hx of  Colonoscopy[  ]; GU: kidney stones [  ]; hematuria[  ];   dysuria [  ];  nocturia[  ];  history of     obstruction [  ];             Skin: rash, swelling[  ];,  hair loss[  ];  peripheral edema[  ];  or itching[  ]; Musculosketetal: myalgias[  ];  joint swelling[  ];  joint erythema[  ];  joint pain[  ];  back pain[  ];  Heme/Lymph: bruising[  ];  bleeding[  ];  anemia[  ];  Neuro: TIA[  ];  headaches[  ];  stroke[  ];  vertigo[  ];  seizures[  ];   paresthesias[  ];  difficulty walking[  ];  Psych:depression[  ]; anxiety[  ];  Endocrine: diabetes[ y ];  thyroid dysfunction[  ];  Immunizations: Flu [ y ]; Pneumococcal[y  ]; shingles Y  Other:  Physical Exam: BP 157/63  Pulse 78  Resp 20  Ht 6\' 1"  (1.854 m)  Wt 147 lb (66.679 kg)  BMI 19.40 kg/m2  SpO2 96%  General appearance: alert, cooperative, appears older than stated age, distracted and no distress Neurologic: intact Heart: regular rate and rhythm, S1, S2 normal, no murmur,  click, rub or gallop and normal apical impulse Lungs: diminished breath sounds bilaterally Abdomen: soft, non-tender; bowel sounds normal; no masses,  no organomegaly Extremities: extremities normal, atraumatic, no cyanosis or edema and Homans sign is negative, no sign of DVT No carotid bruits, pedal pulses are present dp & pt bilaterial  Diagnostic Studies & Laboratory data:     Recent Radiology Findings:  Ct Chest Wo Contrast  05/05/2013   CLINICAL DATA:  Follow-up lung nodule. History of colon cancer 1993.  EXAM: CT CHEST WITHOUT CONTRAST  TECHNIQUE: Multidetector CT imaging of the chest was performed following the standard protocol without IV contrast.  COMPARISON:  CT CHEST W/O CM dated 11/25/2012  FINDINGS: Subcentimeter ground-glass opacities in the right lung apex again seen. Slightly more consolidated ill-defined ground-glass nodular density measuring 31 x 22 mm, previously 33 x 23 mm, relatively unchanged. Right upper lobe anterior segment 13 x 15 mm ground-glass opacity was 10 x 12 mm. Minimal linear scarring in the right lung base. 12 x 12 mm ground-glass consolidation in left upper lobe, anterior segment was 16 x 13 mm.  Moderate centrilobular emphysema. Trace left apical calcified pleural plaque with underlying nodular scarring. No pleural effusions or focal consolidations. Increased lung volumes. Left perihilar air cyst/bleb. No pneumothorax.  Heart size is normal, pericardium is nonsuspicious. Thoracic aorta is normal course and caliber with moderate calcific atherosclerosis. 8 mm short axis precarinal lymph node is similar, 7 mm aortopulmonary window lymph node is unchanged. Thoracic esophagus is unremarkable. Included view of the abdomen is nonsuspicious.  Scattered thoracic small resonance. Soft tissues are nonsuspicious. Partially imaged ACDF.  IMPRESSION: Multiple sub solid pulmonary nodules again seen, with increased size of a right upper lobe sub solid pulmonary nodule (previously 10 x  12 mm, now 13 x 15 mm). Considering interval growth, as previously reported prominent an indolent neoplasm such as low-grade adenocarcinoma may have this appearance. However, slightly decreased size of left upper lobe sub solid nodule.  Moderate centrilobular emphysema.  No acute cardiopulmonary process.   Electronically Signed   By: Elon Alas   On: 05/05/2013 14:27   Ct Chest Wo Contrast  11/25/2012   CLINICAL DATA:  Followup of pulmonary nodules. Ex-smoker. History of remote colon cancer.  EXAM: CT CHEST WITHOUT CONTRAST  TECHNIQUE: Multidetector CT imaging of the chest was performed following the standard protocol without IV contrast.  COMPARISON:  05/21/2012 and back to 07/27/2011.  FINDINGS: Lungs/Pleura: Moderate centrilobular emphysema. Biapical pleural parenchymal scarring.  Peripheral right upper lobe  ground-glass nodule measures 2.3 x 3.3 cm on image 23/series 4. 1.6 x 3.3 cm at the same level on the prior exam. Appears similar on today's sagittal image 30, and grossly similar on 01/07/2012.  Sub solid right upper lobe nodule which measures 1.0 x 1.2 cm on image 27 versus 0.9 x 1.0 cm on the prior exam. Definitely enlarged since 07/27/2011.  Left upper lobe central ground-glass nodule which measures on the order of 1.3 x 1.6 cm on image 25/series 4. 1.6 x 1.7 cm on the prior exam, suggesting stability.  Scattered areas of scarring, some of which are somewhat nodular. No pleural fluid.  Heart/Mediastinum: Dense aortic and great vessel atherosclerosis. Normal heart size, without pericardial effusion. Coronary artery atherosclerosis. Small stable middle mediastinal and prevascular nodes, without mediastinal or hilar adenopathy.  Upper abdomen: Advanced atherosclerosis. Normal adrenal glands.  Bones/Musculoskeletal: Lower cervical spine fixation. No acute osseous abnormality.  IMPRESSION: 1. Multiple sub solid pulmonary nodules. An anterior right upper lobe nodule has definitely enlarged when  compared back to 07/27/2011. This is suspicious for an indolent neoplasm such is low-grade adenocarcinoma. Presuming this is not sampled or resected, recommend special attention on followup. 2. The more peripheral right upper lobe sub solid lesion measures slightly larger today, favored to be due to differences in slice selection. This also warrants special followup attention. 3. Atherosclerosis, including within the coronary arteries. 4. Centrilobular emphysema.   Electronically Signed   By: Abigail Miyamoto   On: 11/25/2012 14:42   Ct Super D Chest Wo Contrast  05/21/2012  *RADIOLOGY REPORT*  Clinical Data:  Evaluate lung nodule.  CHEST CT WITHOUT CONTRAST  Technique:  Multidetector CT imaging of the chest was performed following the standard protocol without intravenous contrast.  Comparison:  Chest CT 01/07/2012.  Findings:  Mediastinum: Heart size is normal. There is no significant pericardial fluid, thickening or pericardial calcification. No pathologically enlarged mediastinal or hilar lymph nodes. Please note that accurate exclusion of hilar adenopathy is limited on noncontrast CT scans.  A small hiatal hernia. There is atherosclerosis of the thoracic aorta, the great vessels of the mediastinum and the coronary arteries, including calcified atherosclerotic plaque in the left anterior descending coronary artery. Calcifications of the aortic valve.  Lungs/Pleura: Again noted are multiple areas of ground-glass attenuation scattered throughout the lungs bilaterally.  Many of these are ill-defined, while others are slightly nodular in appearance.  The largest somewhat nodular area of ground-glass attenuation and reticulation is in the periphery of the right upper lobe (image 21 of series 4), which appears slightly less bulky than the prior examination 01/07/2012, currently measuring 3.3 x 1.6 cm (previously 3.1 x 1.9 cm).  Other notable lesions include a 9 mm lesion in the anterior aspect of the right upper lobe  (image 25 of series 4), and a faint 1.6 x 1.7 cm lesion in the anterior aspect of the left upper lobe (image 26 of series 4), both of which appear unchanged.  There is an increasing nodular density measuring 6 mm in the posterior aspect of the right upper lobe shortly above the major fissure (image 21 of series 4), which is nonspecific, but favored to represent an area of developing pleural scarring.  Mild diffuse bronchial wall thickening with mild centrilobular and paraseptal emphysema.  Bilateral apical nodular pleural parenchymal scarring is unchanged.  No acute consolidative airspace disease. No pleural effusions.  Upper Abdomen: Extensive atherosclerosis.  Musculoskeletal: Orthopedic fixation hardware in the lower cervical spine incompletely visualized. There are no  aggressive appearing lytic or blastic lesions noted in the visualized portions of the skeleton.  A small sclerotic lesion in the inferior aspect of the right scapula is unchanged, most compatible with a bone island.  IMPRESSION: 1.  Overall, the appearance of the chest is very similar to prior examinations with multiple subsolid nodular areas scattered throughout the lungs bilaterally, as above.  The majority of these are unchanged, while the largest one in the periphery of the right upper lobe appears slightly less bulky than the prior examination. Continued attention on follow-up studies is recommended to exclude progression of disease, as slow-growing adenocarcinoma(s) are not excluded. 2.  Atherosclerosis, including left anterior descending coronary artery disease. Assessment for potential risk factor modification, dietary therapy or pharmacologic therapy may be warranted, if clinically indicated. 3.  Mild diffuse bronchial wall thickening with mild centrilobular and paraseptal emphysema. 4.  Additional incidental findings, as above.   Original Report Authenticated By: Vinnie Langton, M.D.    Ct Chest Wo Contrast  01/07/2012  *RADIOLOGY  REPORT*  Clinical Data: Follow-up pulmonary nodules.  History of chronic airway obstruction and aspiration pneumonia.  No history of malignancy.  CT CHEST WITHOUT CONTRAST  Technique:  Multidetector CT imaging of the chest was performed following the standard protocol without IV contrast.  Comparison: Prior chest CTs dated 10/02/2011 and 07/27/2011.  Findings: The mediastinum has a stable appearance with unchanged normal sinus mediastinal lymph nodes.  There is scattered atherosclerosis of the great vessels, aorta and coronary arteries. There is no pleural or pericardial effusion.  Compared with the original study, the airway thickening and patchy peribronchial opacities within the right lower lobe have resolved. These were likely inflammatory.  However, there are several persistent mixed solid and ground-glass densities.  The largest is positioned peripherally in the right upper lobe and measures approximately 3.1 x 1.9 cm on image 19.  A 9 mm mixed ground-glass opacity in the right upper lobe on image 23 has slightly enlarged. A faint pure ground-glass opacity in the right lower lobe on image 39 is stable.  Likewise, there is a stable ground-glass opacity in the left upper lobe on image 21.  Biapical scarring is stable.  The visualized upper abdomen has a stable appearance.  There are multiple calcified lymph nodes within the porta hepatis.  IMPRESSION:  1.  Compared with the most recent examination of 3 months ago, no significant changes are identified.  No acute process is seen. 2.  There are persistent mixed solid and ground-glass opacities in the right upper lobe as well as additional pure ground-glass opacities.  Although possibly postinflammatory, adenocarcinoma cannot be excluded. Thoracic surgical consultation should be considered.  Followup by CT is recommended in 12 months, with continued annual surveillance for a minimum of 3 years.  These recommendations are taken from "Recommendations for the  Management of Subsolid Pulmonary Nodules Detected at CT:  A Statement from the [facility]" Radiology 2013; 266:1, 304- 317.   Original Report Authenticated By: Richardean Sale, M.D.     Recent Lab Findings: Lab Results  Component Value Date   WBC 7.5 07/06/2012   HGB 16.1 07/06/2012   HCT 47.8 07/06/2012   PLT 185 07/06/2012   GLUCOSE 86 01/04/2013   CHOL 160 07/06/2012   TRIG 97 07/06/2012   HDL 48 07/06/2012   LDLCALC 93 07/06/2012   ALT 9 07/06/2012   AST 17 07/06/2012   NA 134* 01/04/2013   K 4.3 01/04/2013   CL 98 01/04/2013   CREATININE  0.88 01/04/2013   BUN 9 01/04/2013   CO2 27 01/04/2013   TSH 1.631 07/28/2011   HGBA1C 6.0* 07/27/2011      Assessment / Plan:   Patient who because of his underlying pulmonary limitations would be a very poor operative candidate now presents with slowly enlarging dominant right lung groundglass opacity. I discussed with the patient at this is likely carcinoma of the lung. We discussed various treatment options. At this point we'll proceed with pulmonary function studies to confirm my impression that his underlying emphysematous disease is severe enough to limit any surgical intervention. I will present him at the  [facility] conference and have him seen in [facility]  clinic to discuss treatment options specifically with stereotactic radiotherapy in mind. At this point he does not have a tissue diagnosis, we wait on that to see if his agreeable to any treatment.   [name] MD  Beeper [phone] Office [phone]

## 2013-05-10 ENCOUNTER — Telehealth: Payer: Self-pay | Admitting: *Deleted

## 2013-05-10 NOTE — Telephone Encounter (Signed)
Called pt to clarify appt time and place for Resp appt and MTOC.  She verbalized understanding of appt's time and place.

## 2013-05-12 ENCOUNTER — Ambulatory Visit (INDEPENDENT_AMBULATORY_CARE_PROVIDER_SITE_OTHER): Payer: Medicare Other | Admitting: Cardiothoracic Surgery

## 2013-05-12 ENCOUNTER — Encounter: Payer: Self-pay | Admitting: Radiation Oncology

## 2013-05-12 ENCOUNTER — Encounter: Payer: Self-pay | Admitting: *Deleted

## 2013-05-12 ENCOUNTER — Ambulatory Visit
Admission: RE | Admit: 2013-05-12 | Discharge: 2013-05-12 | Disposition: A | Discharge status: Home / Self Care | Payer: Medicare Other | Source: Ambulatory Visit | Attending: Radiation Oncology | Admitting: Radiation Oncology

## 2013-05-12 ENCOUNTER — Ambulatory Visit (HOSPITAL_COMMUNITY)
Admission: RE | Admit: 2013-05-12 | Discharge: 2013-05-12 | Disposition: A | Discharge status: Home / Self Care | Payer: Medicare Other | Source: Ambulatory Visit | Attending: Cardiothoracic Surgery | Admitting: Cardiothoracic Surgery

## 2013-05-12 ENCOUNTER — Ambulatory Visit: Payer: Medicare Other | Attending: Radiation Oncology | Admitting: Physical Therapy

## 2013-05-12 DIAGNOSIS — R918 Other nonspecific abnormal finding of lung field: Secondary | ICD-10-CM

## 2013-05-12 DIAGNOSIS — IMO0001 Reserved for inherently not codable concepts without codable children: Secondary | ICD-10-CM | POA: Insufficient documentation

## 2013-05-12 DIAGNOSIS — R269 Unspecified abnormalities of gait and mobility: Secondary | ICD-10-CM | POA: Insufficient documentation

## 2013-05-12 DIAGNOSIS — J984 Other disorders of lung: Secondary | ICD-10-CM | POA: Diagnosis not present

## 2013-05-12 LAB — PULMONARY FUNCTION TEST
DL/VA % pred: 49 %
DL/VA: 2.32 ml/min/mmHg/L
DLCO unc % pred: 38 %
DLCO unc: 13.33 ml/min/mmHg
FEF 25-75 Post: 0.84 L/sec
FEF 25-75 Pre: 0.54 L/sec
FEF2575-%Change-Post: 55 %
FEF2575-%Pred-Post: 34 %
FEF2575-%Pred-Pre: 22 %
FEV1-%Change-Post: 15 %
FEV1-%Pred-Post: 46 %
FEV1-%Pred-Pre: 40 %
FEV1-Post: 1.54 L
FEV1-Pre: 1.33 L
FEV1FVC-%Change-Post: 1 %
FEV1FVC-%Pred-Pre: 66 %
FEV6-%Change-Post: 14 %
FEV6-%Pred-Post: 69 %
FEV6-%Pred-Pre: 60 %
FEV6-Post: 2.98 L
FEV6-Pre: 2.6 L
FEV6FVC-%Change-Post: 0 %
FEV6FVC-%Pred-Post: 102 %
FEV6FVC-%Pred-Pre: 101 %
FVC-%Change-Post: 14 %
FVC-%Pred-Post: 68 %
FVC-%Pred-Pre: 59 %
FVC-Post: 3.1 L
FVC-Pre: 2.72 L
Post FEV1/FVC ratio: 49 %
Post FEV6/FVC ratio: 96 %
Pre FEV1/FVC ratio: 49 %
Pre FEV6/FVC Ratio: 96 %
RV % pred: 157 %
RV: 4.21 L
TLC % pred: 102 %
TLC: 7.63 L

## 2013-05-12 MED ORDER — ALBUTEROL SULFATE (2.5 MG/3ML) 0.083% IN NEBU
2.5000 mg | INHALATION_SOLUTION | Freq: Once | RESPIRATORY_TRACT | Status: AC
  Administered 2013-05-12: 2.5 mg via RESPIRATORY_TRACT

## 2013-05-12 NOTE — Progress Notes (Signed)
Radiation Oncology         [phone] ________________________________  Multidisciplinary Thoracic Oncology Clinic [facility]) Initial Outpatient Consultation  Name: Adam Ward MRN: [id number]  Date: 05/12/2013  DOB: April 23, 1935  DV:[id number], Betsy Coder, MD  [name], MD   REFERRING PHYSICIAN: [name], MD  DIAGNOSIS: 78 year old gentleman with a suspicious enlarging 15 mm right upper lobe sub-solid pulmonary nodule, pending tissue diagnosis  HISTORY OF PRESENT ILLNESS::Adam Ward is a 78 y.o. male referred to Dr. Servando Snare because of abnormal CT scan of the chest. He was treated in the spring of 2013 for a COPD exacerbation and question of tuberculosis. He notes exposure to someone with tuberculosis in 1943. Cultures were negative but CT scan showed multiple bilateral ground glass densities. Follow up scans were done in August of 2013 and January 07, 2012. Dr. Servando Snare first saw him 01/25/12. The patient has been a smoker for more than 50 years just recently switching to electronic cigarettes. Chest CT on 05/05/13 showed multiple sub solid pulmonary nodules again, with increased size of a right upper lobe sub solid pulmonary nodule (previously 10 x 12 mm, now 13 x 15 mm).  In [facility] conference, this one nodule was felt to be suspicious, warranting biopsy.  PREVIOUS RADIATION THERAPY: No  PAST MEDICAL HISTORY:  has a past medical history of Hypertension; High cholesterol; Anxiety; Cancer (1993); Rapid atrial fibrillation (07/28/2011); Cavitary lesion of lung (07/28/2011); Acute respiratory failure with hypoxia (07/26/2011); Bull[place] of lung (07/28/2011); Tobacco abuse (08/01/2011); and COPD (chronic obstructive pulmonary disease).    PAST SURGICAL HISTORY: Past Surgical History  Procedure Laterality Date  . Colon surgery    . Cervical disc surgery    . Tonsillectomy    . Appendectomy      FAMILY HISTORY: family history includes Heart disease in his brother and mother; Pneumonia  in his sister; Stroke in his father.  SOCIAL HISTORY:  reports that he quit smoking about 21 months ago. He does not have any smokeless tobacco history on file. He reports that he does not drink alcohol or use illicit drugs.  ALLERGIES: Celebrex and Tetracyclines & related  MEDICATIONS:  Current Outpatient Prescriptions  Medication Sig Dispense Refill  . alprazolam (XANAX) 2 MG tablet Take 2 mg by mouth 3 (three) times daily.      Marland Kitchen aspirin EC 81 MG tablet Take 81 mg by mouth daily.      . digoxin (LANOXIN) 0.25 MG tablet Take 1 tablet (0.25 mg total) by mouth daily.  90 tablet  6  . diltiazem (CARDIZEM CD) 240 MG 24 hr capsule Take 1 capsule (240 mg total) by mouth daily. Medication for your blood pressure and her heart rate.  90 capsule  6  . docusate sodium (COLACE) 100 MG capsule Take 100 mg by mouth at bedtime.      . hydrocortisone cream 1 % Apply 1 application topically 2 (two) times daily. Apply to areas of psoriasis      . Multiple Vitamins-Minerals (MULTIVITAMIN WITH MINERALS) tablet Take 1 tablet by mouth daily.      . Omega-3 Fatty Acids (FISH OIL) 1000 MG CAPS Take 1 capsule by mouth daily.      Marland Kitchen PROAIR HFA 108 (90 BASE) MCG/ACT inhaler Inhale into the lungs as needed.       . simvastatin (ZOCOR) 20 MG tablet Take 20 mg by mouth daily.       No current facility-administered medications for this encounter.    REVIEW  OF SYSTEMS:  A 15 point review of systems is documented in the electronic medical record. This was obtained by the nursing staff. However, I reviewed this with the patient to discuss relevant findings and make appropriate changes.  Pertinent items are noted in HPI.   PHYSICAL EXAM: The patient is in no acute distress today.  The patient was recently examined by Dr. Servando Snare revealing the following findings: General appearance: alert, cooperative, appears older than stated age, distracted and no distress  Neurologic: intact  Heart: regular rate and rhythm, S1, S2  normal, no murmur, click, rub or gallop and normal apical impulse  Lungs: diminished breath sounds bilaterally  Abdomen: soft, non-tender; bowel sounds normal; no masses, no organomegaly  Extremities: extremities normal, atraumatic, no cyanosis or edema and Homans sign is negative, no sign of DVT  No carotid bruits, pedal pulses are present dp & pt bilaterial Exam unchanged today.  KPS = 90  100 - Normal; no complaints; no evidence of disease. 90   - Able to carry on normal activity; minor signs or symptoms of disease. 80   - Normal activity with effort; some signs or symptoms of disease. 69   - Cares for self; unable to carry on normal activity or to do active work. 60   - Requires occasional assistance, but is able to care for most of his personal needs. 50   - Requires considerable assistance and frequent medical care. 66   - Disabled; requires special care and assistance. 42   - Severely disabled; hospital admission is indicated although death not imminent. 29   - Very sick; hospital admission necessary; active supportive treatment necessary. 10   - Moribund; fatal processes progressing rapidly. 0     - Dead  Karnofsky DA, Abelmann Hershey, Craver LS and Burchenal JH [phone]) The use of the nitrogen mustards in the palliative treatment of carcinoma: with particular reference to bronchogenic carcinoma Cancer 1 634-56  LABORATORY DATA:  Lab Results  Component Value Date   WBC 7.5 07/06/2012   HGB 16.1 07/06/2012   HCT 47.8 07/06/2012   MCV 90.0 07/06/2012   PLT 185 07/06/2012   Lab Results  Component Value Date   NA 134* 01/04/2013   K 4.3 01/04/2013   CL 98 01/04/2013   CO2 27 01/04/2013   Lab Results  Component Value Date   ALT 9 07/06/2012   AST 17 07/06/2012   ALKPHOS 93 07/06/2012   BILITOT 0.7 07/06/2012    PULMONARY FUNCTION TEST:     RADIOGRAPHY: Ct Chest Wo Contrast  05/05/2013   CLINICAL DATA:  Follow-up lung nodule. History of colon cancer 1993.  EXAM: CT CHEST WITHOUT CONTRAST   TECHNIQUE: Multidetector CT imaging of the chest was performed following the standard protocol without IV contrast.  COMPARISON:  CT CHEST W/O CM dated 11/25/2012  FINDINGS: Subcentimeter ground-glass opacities in the right lung apex again seen. Slightly more consolidated ill-defined ground-glass nodular density measuring 31 x 22 mm, previously 33 x 23 mm, relatively unchanged. Right upper lobe anterior segment 13 x 15 mm ground-glass opacity was 10 x 12 mm. Minimal linear scarring in the right lung base. 12 x 12 mm ground-glass consolidation in left upper lobe, anterior segment was 16 x 13 mm.  Moderate centrilobular emphysema. Trace left apical calcified pleural plaque with underlying nodular scarring. No pleural effusions or focal consolidations. Increased lung volumes. Left perihilar air cyst/bleb. No pneumothorax.  Heart size is normal, pericardium is nonsuspicious. Thoracic aorta is normal course  and caliber with moderate calcific atherosclerosis. 8 mm short axis precarinal lymph node is similar, 7 mm aortopulmonary window lymph node is unchanged. Thoracic esophagus is unremarkable. Included view of the abdomen is nonsuspicious.  Scattered thoracic small resonance. Soft tissues are nonsuspicious. Partially imaged ACDF.  IMPRESSION: Multiple sub solid pulmonary nodules again seen, with increased size of a right upper lobe sub solid pulmonary nodule (previously 10 x 12 mm, now 13 x 15 mm). Considering interval growth, as previously reported prominent an indolent neoplasm such as low-grade adenocarcinoma may have this appearance. However, slightly decreased size of left upper lobe sub solid nodule.  Moderate centrilobular emphysema.  No acute cardiopulmonary process.   Electronically Signed   By: Elon Alas   On: 05/05/2013 14:27      IMPRESSION: This patient is a very nice 78 year old gentleman with a suspicious enlarging 15 mm right upper lobe sub-solid pulmonary nodule, pending tissue diagnosis.  He  also has other some solid nodules which appear to be stable on serial imaging. Biopsy of the enlarging right upper lung nodule appears to be warranted. Thoracic surgery is recommending electronavigational bronchoscopy to minimize his risk for pneumothorax. If the patient is confirmed to have a well-differentiated adenocarcinoma in situ, formerly known as bronchoalveolar carcinoma, he may benefit from stereotactic body radiotherapy for definitive cure, assuming his other lung nodules remained stable.  PLAN:Today, I talked to the patient and family about the findings and work-up thus far.  We discussed the behavior of well-differentiated adenocarcinoma in situ nodules and general treatment, highlighting the role or radiotherapy and stereotactic body radiotherapy in the management.  We discussed the available radiation techniques, and focused on the details of logistics and delivery.  We reviewed the anticipated acute and late sequelae associated with radiation in this setting.  The patient was encouraged to ask questions that I answered to the best of my ability.  I filled out a patient counseling form during our discussion including treatment diagrams.  We retained a copy for our records.  The patient would like to proceed with bronchoscopic biopsy. Depending on the results of the biopsy, we will followup to discuss possible radiation therapy.  I spent 40 minutes minutes face to face with the patient and more than 50% of that time was spent in counseling and/or coordination of care.    ------------------------------------------------  Sheral Apley. Tammi Klippel, M.D.

## 2013-05-13 ENCOUNTER — Other Ambulatory Visit: Payer: Self-pay

## 2013-05-13 ENCOUNTER — Encounter: Payer: Self-pay | Admitting: *Deleted

## 2013-05-13 DIAGNOSIS — D381 Neoplasm of uncertain behavior of trachea, bronchus and lung: Secondary | ICD-10-CM

## 2013-05-13 NOTE — Progress Notes (Signed)
[place] Record #[id number] Date of Birth: Jul 05, 1935  Referring: Halford Chessman, MD Primary Care: Purvis Kilts, MD  Chief Complaint:    SOB/ Multiple lung lesions   History of Present Illness:    The patient is an elderly 78 year old male, referred because of abnormal CT scan of the chest. He was treated in the spring of 2013 for a COPD exacerbation and question of tuberculosis. He notes exposure to someone with tuberculosis in 1943.  Cultures were negative but CT scan showed multiple bilateral ground glass densities. Follow up scans were done in August of 2013 and January 07, 2012. I first saw him 01/25/12.  The patient has been a smoker for more than 50 years just recently switching to electronic cigarettes.   Since last seen he continues not to smoke complains mostly of his legs getting weak before he gets short of breath. Patient pulmonary reserve these in great limited, physically he has difficulty with balance and ambulation, he appears to be a very poor candidate for any surgical intervention or pulmonary resection.  He comes to [facility] today to discuss dx and treatment options for the enlarging lung nodule on the right  Current Activity/ Functional Status: Patient is independent with mobility/ambulation, transfers, ADL's, IADL's.    Past Medical History  Diagnosis Date  . Hypertension   . High cholesterol   . Anxiety   . Cancer 1993    colon  . Rapid atrial fibrillation 07/28/2011    Paroxysmal, newly diagnosed.  . Cavitary lesion of lung 07/28/2011    AFB smears x3 negative.  . Acute respiratory failure with hypoxia 07/26/2011  . Bulla of lung 07/28/2011  . Tobacco abuse 08/01/2011  . COPD (chronic obstructive pulmonary disease)     Past Surgical History  Procedure Laterality Date  . Colon surgery    . Cervical disc surgery    . Tonsillectomy    . Appendectomy      Family History  Problem Relation Age of  Onset  . Heart disease Mother   . Stroke Father   . Pneumonia Sister   . Heart disease Brother     History   Social History  . Marital Status: Married    Spouse Name: N/A    Number of Children: N/A  . Years of Education: N/A   Occupational History  . Worked for [employer]   Social History Main Topics  . Smoking status: Former Smoker    Quit date: 07/16/2011  . Smokeless tobacco: Not on file     Comment: started using electric ciggerette in 06-2010  . Alcohol Use: No  . Drug Use: No  . Sexually Active: Not on file      History  Smoking status  . Former Smoker  . Quit date: 07/16/2011  Smokeless tobacco  . Not on file    Comment: started using electric ciggerette in 06-2010    History  Alcohol Use No     Allergies  Allergen Reactions  . Celebrex [Celecoxib]   . Tetracyclines & Related Hives    Current Outpatient Prescriptions  Medication Sig Dispense Refill  . alprazolam (XANAX) 2 MG tablet Take 2 mg by mouth 3 (three) times daily.      Marland Kitchen aspirin EC 81 MG tablet Take 81 mg by mouth daily.      Marland Kitchen  digoxin (LANOXIN) 0.25 MG tablet Take 1 tablet (0.25 mg total) by mouth daily.  90 tablet  6  . diltiazem (CARDIZEM CD) 240 MG 24 hr capsule Take 1 capsule (240 mg total) by mouth daily. Medication for your blood pressure and her heart rate.  90 capsule  6  . docusate sodium (COLACE) 100 MG capsule Take 100 mg by mouth at bedtime.      . hydrocortisone cream 1 % Apply 1 application topically 2 (two) times daily. Apply to areas of psoriasis      . Multiple Vitamins-Minerals (MULTIVITAMIN WITH MINERALS) tablet Take 1 tablet by mouth daily.      . Omega-3 Fatty Acids (FISH OIL) 1000 MG CAPS Take 1 capsule by mouth daily.      Marland Kitchen PROAIR HFA 108 (90 BASE) MCG/ACT inhaler Inhale into the lungs as needed.       . simvastatin (ZOCOR) 20 MG tablet Take 20 mg by mouth daily.       No current facility-administered medications for this visit.       Review of Systems:       Cardiac Review of Systems: Y or N  Chest Pain [ n   ]  Resting SOB [ n  ] Exertional SOB  [ y ]  Vertell Limber Florencio.Farrier  ]   Pedal Edema [  n ]    Palpitations [n  ] Syncope  [ n ]   Presyncope n   ]  General Review of Systems: [Y] = yes [  ]=no Constitional: recent weight change [n  ]; anorexia [ n ]; fatigue [ n ]; nausea [  ]; night sweats [ n ]; fever [ n ]; or chills [ n ];                                                                                                                                            Eye : blurred vision [  ]; diplopia [   ]; vision changes [  ];  Amaurosis fugax[  ]; Resp: cough [ y ];  wheezing[y  ];  hemoptysis[n  ]; shortness of breath[  ]; paroxysmal nocturnal dyspnea[  ]; dyspnea on exertion[ y ]; or orthopnea[  ];  GI:  gallstones[ n ], vomiting[  ];  dysphagia[  ]; melena[  ];  hematochezia [  ]; heartburn[  ];   Hx of  Colonoscopy[  ]; GU: kidney stones [  ]; hematuria[  ];   dysuria [  ];  nocturia[  ];  history of     obstruction [  ];             Skin: rash, swelling[  ];, hair loss[  ];  peripheral edema[  ];  or itching[  ]; Musculosketetal: myalgias[  ];  joint swelling[  ];  joint erythema[  ];  joint pain[  ];  back pain[  ];  Heme/Lymph: bruising[  ];  bleeding[  ];  anemia[  ];  Neuro: TIA[  ];  headaches[  ];  stroke[  ];  vertigo[  ];  seizures[  ];   paresthesias[  ];  difficulty walking[  ];  Psych:depression[  ]; anxiety[  ];  Endocrine: diabetes[ y ];  thyroid dysfunction[  ];  Immunizations: Flu [ y ]; Pneumococcal[y  ]; shingles Y  Other:  Physical Exam: Wt Readings from Last 3 Encounters:  05/12/13 146 lb 6.4 oz (66.407 kg)  05/05/13 147 lb (66.679 kg)  01/04/13 143 lb (64.864 kg)   Temp Readings from Last 3 Encounters:  05/12/13 97.9 F (36.6 C) Oral  08/01/11 97.7 F (36.5 C)    BP Readings from Last 3 Encounters:  05/12/13 124/73  05/05/13 157/63  01/04/13 145/70   Pulse Readings from Last 3 Encounters:  05/12/13 73   05/05/13 78  01/04/13 79   General appearance: alert, cooperative, appears older than stated age, distracted and no distress Neurologic: intact Heart: regular rate and rhythm, S1, S2 normal, no murmur, click, rub or gallop and normal apical impulse Lungs: diminished breath sounds bilaterally Abdomen: soft, non-tender; bowel sounds normal; no masses,  no organomegaly Extremities: extremities normal, atraumatic, no cyanosis or edema and Homans sign is negative, no sign of DVT No carotid bruits, pedal pulses are present dp & pt bilaterial  Diagnostic Studies & Laboratory data:     Recent Radiology Findings:  Ct Chest Wo Contrast  05/05/2013   CLINICAL DATA:  Follow-up lung nodule. History of colon cancer 1993.  EXAM: CT CHEST WITHOUT CONTRAST  TECHNIQUE: Multidetector CT imaging of the chest was performed following the standard protocol without IV contrast.  COMPARISON:  CT CHEST W/O CM dated 11/25/2012  FINDINGS: Subcentimeter ground-glass opacities in the right lung apex again seen. Slightly more consolidated ill-defined ground-glass nodular density measuring 31 x 22 mm, previously 33 x 23 mm, relatively unchanged. Right upper lobe anterior segment 13 x 15 mm ground-glass opacity was 10 x 12 mm. Minimal linear scarring in the right lung base. 12 x 12 mm ground-glass consolidation in left upper lobe, anterior segment was 16 x 13 mm.  Moderate centrilobular emphysema. Trace left apical calcified pleural plaque with underlying nodular scarring. No pleural effusions or focal consolidations. Increased lung volumes. Left perihilar air cyst/bleb. No pneumothorax.  Heart size is normal, pericardium is nonsuspicious. Thoracic aorta is normal course and caliber with moderate calcific atherosclerosis. 8 mm short axis precarinal lymph node is similar, 7 mm aortopulmonary window lymph node is unchanged. Thoracic esophagus is unremarkable. Included view of the abdomen is nonsuspicious.  Scattered thoracic small  resonance. Soft tissues are nonsuspicious. Partially imaged ACDF.  IMPRESSION: Multiple sub solid pulmonary nodules again seen, with increased size of a right upper lobe sub solid pulmonary nodule (previously 10 x 12 mm, now 13 x 15 mm). Considering interval growth, as previously reported prominent an indolent neoplasm such as low-grade adenocarcinoma may have this appearance. However, slightly decreased size of left upper lobe sub solid nodule.  Moderate centrilobular emphysema.  No acute cardiopulmonary process.   Electronically Signed   By: Elon Alas   On: 05/05/2013 14:27   Ct Chest Wo Contrast  11/25/2012   CLINICAL DATA:  Followup of pulmonary nodules. Ex-smoker. History of remote colon cancer.  EXAM: CT CHEST WITHOUT CONTRAST  TECHNIQUE: Multidetector CT imaging of the chest was performed following the standard protocol without IV contrast.  COMPARISON:  05/21/2012 and back to 07/27/2011.  FINDINGS: Lungs/Pleura: Moderate centrilobular emphysema. Biapical pleural parenchymal scarring.  Peripheral right upper lobe ground-glass nodule measures 2.3 x 3.3 cm on image 23/series 4. 1.6 x 3.3 cm at the same level on the prior exam. Appears similar on today's sagittal image 30, and grossly similar on 01/07/2012.  Sub solid right upper lobe nodule which measures 1.0 x 1.2 cm on image 27 versus 0.9 x 1.0 cm on the prior exam. Definitely enlarged since 07/27/2011.  Left upper lobe central ground-glass nodule which measures on the order of 1.3 x 1.6 cm on image 25/series 4. 1.6 x 1.7 cm on the prior exam, suggesting stability.  Scattered areas of scarring, some of which are somewhat nodular. No pleural fluid.  Heart/Mediastinum: Dense aortic and great vessel atherosclerosis. Normal heart size, without pericardial effusion. Coronary artery atherosclerosis. Small stable middle mediastinal and prevascular nodes, without mediastinal or hilar adenopathy.  Upper abdomen: Advanced atherosclerosis. Normal adrenal  glands.  Bones/Musculoskeletal: Lower cervical spine fixation. No acute osseous abnormality.  IMPRESSION: 1. Multiple sub solid pulmonary nodules. An anterior right upper lobe nodule has definitely enlarged when compared back to 07/27/2011. This is suspicious for an indolent neoplasm such is low-grade adenocarcinoma. Presuming this is not sampled or resected, recommend special attention on followup. 2. The more peripheral right upper lobe sub solid lesion measures slightly larger today, favored to be due to differences in slice selection. This also warrants special followup attention. 3. Atherosclerosis, including within the coronary arteries. 4. Centrilobular emphysema.   Electronically Signed   By: Abigail Miyamoto   On: 11/25/2012 14:42   Ct Super D Chest Wo Contrast  05/21/2012  *RADIOLOGY REPORT*  Clinical Data:  Evaluate lung nodule.  CHEST CT WITHOUT CONTRAST  Technique:  Multidetector CT imaging of the chest was performed following the standard protocol without intravenous contrast.  Comparison:  Chest CT 01/07/2012.  Findings:  Mediastinum: Heart size is normal. There is no significant pericardial fluid, thickening or pericardial calcification. No pathologically enlarged mediastinal or hilar lymph nodes. Please note that accurate exclusion of hilar adenopathy is limited on noncontrast CT scans.  A small hiatal hernia. There is atherosclerosis of the thoracic aorta, the great vessels of the mediastinum and the coronary arteries, including calcified atherosclerotic plaque in the left anterior descending coronary artery. Calcifications of the aortic valve.  Lungs/Pleura: Again noted are multiple areas of ground-glass attenuation scattered throughout the lungs bilaterally.  Many of these are ill-defined, while others are slightly nodular in appearance.  The largest somewhat nodular area of ground-glass attenuation and reticulation is in the periphery of the right upper lobe (image 21 of series 4), which appears  slightly less bulky than the prior examination 01/07/2012, currently measuring 3.3 x 1.6 cm (previously 3.1 x 1.9 cm).  Other notable lesions include a 9 mm lesion in the anterior aspect of the right upper lobe (image 25 of series 4), and a faint 1.6 x 1.7 cm lesion in the anterior aspect of the left upper lobe (image 26 of series 4), both of which appear unchanged.  There is an increasing nodular density measuring 6 mm in the posterior aspect of the right upper lobe shortly above the major fissure (image 21 of series 4), which is nonspecific, but favored to represent an area of developing pleural scarring.  Mild diffuse bronchial wall thickening with mild centrilobular and paraseptal emphysema.  Bilateral apical nodular pleural parenchymal scarring is unchanged.  No acute consolidative airspace disease. No  pleural effusions.  Upper Abdomen: Extensive atherosclerosis.  Musculoskeletal: Orthopedic fixation hardware in the lower cervical spine incompletely visualized. There are no aggressive appearing lytic or blastic lesions noted in the visualized portions of the skeleton.  A small sclerotic lesion in the inferior aspect of the right scapula is unchanged, most compatible with a bone island.  IMPRESSION: 1.  Overall, the appearance of the chest is very similar to prior examinations with multiple subsolid nodular areas scattered throughout the lungs bilaterally, as above.  The majority of these are unchanged, while the largest one in the periphery of the right upper lobe appears slightly less bulky than the prior examination. Continued attention on follow-up studies is recommended to exclude progression of disease, as slow-growing adenocarcinoma(s) are not excluded. 2.  Atherosclerosis, including left anterior descending coronary artery disease. Assessment for potential risk factor modification, dietary therapy or pharmacologic therapy may be warranted, if clinically indicated. 3.  Mild diffuse bronchial wall  thickening with mild centrilobular and paraseptal emphysema. 4.  Additional incidental findings, as above.   Original Report Authenticated By: Vinnie Langton, M.D.    Ct Chest Wo Contrast  01/07/2012  *RADIOLOGY REPORT*  Clinical Data: Follow-up pulmonary nodules.  History of chronic airway obstruction and aspiration pneumonia.  No history of malignancy.  CT CHEST WITHOUT CONTRAST  Technique:  Multidetector CT imaging of the chest was performed following the standard protocol without IV contrast.  Comparison: Prior chest CTs dated 10/02/2011 and 07/27/2011.  Findings: The mediastinum has a stable appearance with unchanged normal sinus mediastinal lymph nodes.  There is scattered atherosclerosis of the great vessels, aorta and coronary arteries. There is no pleural or pericardial effusion.  Compared with the original study, the airway thickening and patchy peribronchial opacities within the right lower lobe have resolved. These were likely inflammatory.  However, there are several persistent mixed solid and ground-glass densities.  The largest is positioned peripherally in the right upper lobe and measures approximately 3.1 x 1.9 cm on image 19.  A 9 mm mixed ground-glass opacity in the right upper lobe on image 23 has slightly enlarged. A faint pure ground-glass opacity in the right lower lobe on image 39 is stable.  Likewise, there is a stable ground-glass opacity in the left upper lobe on image 21.  Biapical scarring is stable.  The visualized upper abdomen has a stable appearance.  There are multiple calcified lymph nodes within the porta hepatis.  IMPRESSION:  1.  Compared with the most recent examination of 3 months ago, no significant changes are identified.  No acute process is seen. 2.  There are persistent mixed solid and ground-glass opacities in the right upper lobe as well as additional pure ground-glass opacities.  Although possibly postinflammatory, adenocarcinoma cannot be excluded. Thoracic  surgical consultation should be considered.  Followup by CT is recommended in 12 months, with continued annual surveillance for a minimum of 3 years.  These recommendations are taken from "Recommendations for the Management of Subsolid Pulmonary Nodules Detected at CT:  A Statement from the [facility]" Radiology 2013; 266:1, 304- 317.   Original Report Authenticated By: Richardean Sale, M.D.     Recent Lab Findings: Lab Results  Component Value Date   WBC 7.5 07/06/2012   HGB 16.1 07/06/2012   HCT 47.8 07/06/2012   PLT 185 07/06/2012   GLUCOSE 86 01/04/2013   CHOL 160 07/06/2012   TRIG 97 07/06/2012   HDL 48 07/06/2012   LDLCALC 93 07/06/2012   ALT 9 07/06/2012  AST 17 07/06/2012   NA 134* 01/04/2013   K 4.3 01/04/2013   CL 98 01/04/2013   CREATININE 0.88 01/04/2013   BUN 9 01/04/2013   CO2 27 01/04/2013   TSH 1.631 07/28/2011   HGBA1C 6.0* 07/27/2011   PFT's  FEV1 1.33 40%  DLCO  13.33 38%   Assessment / Plan:   Patient who because of his underlying pulmonary limitations would be a very poor operative candidate now presents with slowly enlarging dominant right lung groundglass opacity. I discussed with the patient at this is likely carcinoma of the lung. We discussed various treatment options. At this point we'll proceed with pulmonary function studies to confirm my impression that his underlying emphysematous disease is severe enough to limit any surgical intervention. Patient present  at the  [facility] conference and has  seen in [facility]  clinic to discuss treatment options specifically with stereotactic radiotherapy in mind.  The right upper lobe nodule which has been documented to have enlarged could be treated with stereotatic radiation therpy. Patient is agreeable to treatment with radiation if needed. I have discussed with him ENB / broncoscopy with bx to obtain a tissue dx. Will plan for March 18  [name] MD  Beeper [phone] Office [phone]

## 2013-05-14 ENCOUNTER — Encounter: Payer: Self-pay | Admitting: Radiation Oncology

## 2013-05-16 ENCOUNTER — Telehealth: Payer: Self-pay | Admitting: Internal Medicine

## 2013-05-16 ENCOUNTER — Encounter (HOSPITAL_COMMUNITY): Payer: Self-pay | Admitting: Pharmacy Technician

## 2013-05-16 ENCOUNTER — Encounter: Payer: Self-pay | Admitting: Cardiothoracic Surgery

## 2013-05-16 NOTE — Telephone Encounter (Signed)
Pt advised to hold simvastatin until Dr.Ross advises new rx

## 2013-05-16 NOTE — Telephone Encounter (Signed)
Patient's wife states that he received letter from [facility] regarding Cardia XT and Simvastatin.  States that patient should not be taking these meds together. Plesase advise. / tgs

## 2013-05-17 ENCOUNTER — Encounter (HOSPITAL_COMMUNITY): Payer: Self-pay | Admitting: *Deleted

## 2013-05-17 NOTE — Progress Notes (Signed)
05/17/13 1054  OBSTRUCTIVE SLEEP APNEA  Have you ever been diagnosed with sleep apnea through a sleep study? No  Do you snore loudly (loud enough to be heard through closed doors)?  1  Do you often feel tired, fatigued, or sleepy during the daytime? 1  Has anyone observed you stop breathing during your sleep? 0  Do you have, or are you being treated for high blood pressure? 1  BMI more than 35 kg/m2? 0  Age over 76 years old? 1  Neck circumference greater than 40 cm/18 inches? 0  Gender: 1  Obstructive Sleep Apnea Score 5  Score 4 or greater  Results sent to PCP

## 2013-05-18 ENCOUNTER — Ambulatory Visit (HOSPITAL_COMMUNITY): Payer: Medicare Other

## 2013-05-18 ENCOUNTER — Ambulatory Visit (HOSPITAL_COMMUNITY): Payer: Medicare Other | Admitting: Anesthesiology

## 2013-05-18 ENCOUNTER — Encounter (HOSPITAL_COMMUNITY): Payer: Self-pay | Admitting: *Deleted

## 2013-05-18 ENCOUNTER — Ambulatory Visit (HOSPITAL_COMMUNITY)
Admission: RE | Admit: 2013-05-18 | Discharge: 2013-05-18 | Disposition: A | Discharge status: Home / Self Care | Payer: Medicare Other | Source: Ambulatory Visit | Attending: Cardiothoracic Surgery | Admitting: Cardiothoracic Surgery

## 2013-05-18 ENCOUNTER — Encounter (HOSPITAL_COMMUNITY)
Admission: RE | Disposition: A | Discharge status: Home / Self Care | Payer: Self-pay | Source: Ambulatory Visit | Attending: Cardiothoracic Surgery

## 2013-05-18 ENCOUNTER — Encounter (HOSPITAL_COMMUNITY): Payer: Medicare Other | Admitting: Anesthesiology

## 2013-05-18 DIAGNOSIS — C3410 Malignant neoplasm of upper lobe, unspecified bronchus or lung: Secondary | ICD-10-CM | POA: Insufficient documentation

## 2013-05-18 DIAGNOSIS — C3490 Malignant neoplasm of unspecified part of unspecified bronchus or lung: Secondary | ICD-10-CM | POA: Diagnosis not present

## 2013-05-18 DIAGNOSIS — E7800 Pure hypercholesterolemia, unspecified: Secondary | ICD-10-CM | POA: Diagnosis not present

## 2013-05-18 DIAGNOSIS — K219 Gastro-esophageal reflux disease without esophagitis: Secondary | ICD-10-CM | POA: Insufficient documentation

## 2013-05-18 DIAGNOSIS — I4891 Unspecified atrial fibrillation: Secondary | ICD-10-CM | POA: Diagnosis not present

## 2013-05-18 DIAGNOSIS — Z85038 Personal history of other malignant neoplasm of large intestine: Secondary | ICD-10-CM | POA: Diagnosis not present

## 2013-05-18 DIAGNOSIS — Z87891 Personal history of nicotine dependence: Secondary | ICD-10-CM | POA: Insufficient documentation

## 2013-05-18 DIAGNOSIS — R51 Headache: Secondary | ICD-10-CM | POA: Insufficient documentation

## 2013-05-18 DIAGNOSIS — I10 Essential (primary) hypertension: Secondary | ICD-10-CM | POA: Insufficient documentation

## 2013-05-18 DIAGNOSIS — D381 Neoplasm of uncertain behavior of trachea, bronchus and lung: Secondary | ICD-10-CM | POA: Diagnosis not present

## 2013-05-18 DIAGNOSIS — J449 Chronic obstructive pulmonary disease, unspecified: Secondary | ICD-10-CM | POA: Diagnosis not present

## 2013-05-18 DIAGNOSIS — J438 Other emphysema: Secondary | ICD-10-CM | POA: Insufficient documentation

## 2013-05-18 DIAGNOSIS — R918 Other nonspecific abnormal finding of lung field: Secondary | ICD-10-CM

## 2013-05-18 DIAGNOSIS — F411 Generalized anxiety disorder: Secondary | ICD-10-CM | POA: Insufficient documentation

## 2013-05-18 DIAGNOSIS — J984 Other disorders of lung: Secondary | ICD-10-CM | POA: Diagnosis not present

## 2013-05-18 HISTORY — DX: Headache: R51

## 2013-05-18 HISTORY — DX: Pneumonia, unspecified organism: J18.9

## 2013-05-18 HISTORY — DX: Paresthesia of skin: R20.2

## 2013-05-18 HISTORY — PX: VIDEO BRONCHOSCOPY WITH ENDOBRONCHIAL NAVIGATION: SHX6175

## 2013-05-18 HISTORY — DX: Paresthesia of skin: R20.0

## 2013-05-18 HISTORY — DX: Gastro-esophageal reflux disease without esophagitis: K21.9

## 2013-05-18 LAB — COMPREHENSIVE METABOLIC PANEL
ALT: 9 U/L (ref 0–53)
AST: 18 U/L (ref 0–37)
Albumin: 3.8 g/dL (ref 3.5–5.2)
Alkaline Phosphatase: 96 U/L (ref 39–117)
BUN: 9 mg/dL (ref 6–23)
CO2: 30 mEq/L (ref 19–32)
Calcium: 9.2 mg/dL (ref 8.4–10.5)
Chloride: 97 mEq/L (ref 96–112)
Creatinine, Ser: 0.86 mg/dL (ref 0.50–1.35)
GFR calc Af Amer: >90 mL/min (ref >90)
GFR calc non Af Amer: 83 mL/min — ABNORMAL LOW (ref >90)
Glucose, Bld: 101 mg/dL — ABNORMAL HIGH (ref 70–99)
Potassium: 4.2 mEq/L (ref 3.7–5.3)
Sodium: 137 mEq/L (ref 137–147)
Total Bilirubin: 0.4 mg/dL (ref 0.3–1.2)
Total Protein: 7.2 g/dL (ref 6.0–8.3)

## 2013-05-18 LAB — CBC
HCT: 46.2 % (ref 39.0–52.0)
Hemoglobin: 15.4 g/dL (ref 13.0–17.0)
MCH: 30.7 pg (ref 26.0–34.0)
MCHC: 33.3 g/dL (ref 30.0–36.0)
MCV: 92.2 fL (ref 78.0–100.0)
Platelets: 189 10*3/uL (ref 150–400)
RBC: 5.01 MIL/uL (ref 4.22–5.81)
RDW: 12.6 % (ref 11.5–15.5)
WBC: 7.6 10*3/uL (ref 4.0–10.5)

## 2013-05-18 LAB — PROTIME-INR
INR: 0.86 (ref 0.00–1.49)
Prothrombin Time: 11.6 seconds (ref 11.6–15.2)

## 2013-05-18 LAB — APTT: aPTT: 27 seconds (ref 24–37)

## 2013-05-18 SURGERY — VIDEO BRONCHOSCOPY WITH ENDOBRONCHIAL NAVIGATION
Anesthesia: General | Site: Chest

## 2013-05-18 MED ORDER — LACTATED RINGERS IV SOLN
INTRAVENOUS | Status: DC
  Administered 2013-05-18: 10:00:00 via INTRAVENOUS

## 2013-05-18 MED ORDER — 0.9 % SODIUM CHLORIDE (POUR BTL) OPTIME
TOPICAL | Status: DC | PRN
  Administered 2013-05-18: 1000 mL

## 2013-05-18 MED ORDER — ROCURONIUM BROMIDE 100 MG/10ML IV SOLN
INTRAVENOUS | Status: DC | PRN
  Administered 2013-05-18: 40 mg via INTRAVENOUS

## 2013-05-18 MED ORDER — ONDANSETRON HCL 4 MG/2ML IJ SOLN
INTRAMUSCULAR | Status: DC | PRN
  Administered 2013-05-18: 4 mg via INTRAVENOUS

## 2013-05-18 MED ORDER — ONDANSETRON HCL 4 MG/2ML IJ SOLN: 4.0000 mg | Freq: Once | INTRAMUSCULAR | Status: DC | PRN

## 2013-05-18 MED ORDER — DEXTROSE 5 % IV SOLN
10.0000 mg | INTRAVENOUS | Status: DC | PRN
  Administered 2013-05-18: 40 ug/min via INTRAVENOUS

## 2013-05-18 MED ORDER — SODIUM CHLORIDE 0.9 % IJ SOLN
INTRAMUSCULAR | Status: AC
  Filled 2013-05-18: qty 10

## 2013-05-18 MED ORDER — LIDOCAINE HCL (CARDIAC) 20 MG/ML IV SOLN
INTRAVENOUS | Status: AC
  Filled 2013-05-18: qty 5

## 2013-05-18 MED ORDER — GLYCOPYRROLATE 0.2 MG/ML IJ SOLN
INTRAMUSCULAR | Status: DC | PRN
  Administered 2013-05-18: 0.6 mg via INTRAVENOUS

## 2013-05-18 MED ORDER — ROCURONIUM BROMIDE 50 MG/5ML IV SOLN
INTRAVENOUS | Status: AC
  Filled 2013-05-18: qty 1

## 2013-05-18 MED ORDER — SUCCINYLCHOLINE CHLORIDE 20 MG/ML IJ SOLN
INTRAMUSCULAR | Status: AC
  Filled 2013-05-18: qty 1

## 2013-05-18 MED ORDER — FENTANYL CITRATE 0.05 MG/ML IJ SOLN
INTRAMUSCULAR | Status: AC
  Filled 2013-05-18: qty 5

## 2013-05-18 MED ORDER — NEOSTIGMINE METHYLSULFATE 1 MG/ML IJ SOLN
INTRAMUSCULAR | Status: DC | PRN
  Administered 2013-05-18: 4 mg via INTRAVENOUS

## 2013-05-18 MED ORDER — FENTANYL CITRATE 0.05 MG/ML IJ SOLN
INTRAMUSCULAR | Status: DC | PRN
  Administered 2013-05-18: 100 ug via INTRAVENOUS

## 2013-05-18 MED ORDER — OXYCODONE HCL 5 MG/5ML PO SOLN: 5.0000 mg | Freq: Once | ORAL | Status: DC | PRN

## 2013-05-18 MED ORDER — HYDROMORPHONE HCL PF 1 MG/ML IJ SOLN: 0.2500 mg | INTRAMUSCULAR | Status: DC | PRN

## 2013-05-18 MED ORDER — OXYCODONE HCL 5 MG PO TABS: 5.0000 mg | ORAL_TABLET | Freq: Once | ORAL | Status: DC | PRN

## 2013-05-18 MED ORDER — PROPOFOL 10 MG/ML IV BOLUS
INTRAVENOUS | Status: DC | PRN
  Administered 2013-05-18: 130 mg via INTRAVENOUS

## 2013-05-18 MED ORDER — ONDANSETRON HCL 4 MG/2ML IJ SOLN
INTRAMUSCULAR | Status: AC
  Filled 2013-05-18: qty 2

## 2013-05-18 MED ORDER — LACTATED RINGERS IV SOLN
INTRAVENOUS | Status: DC | PRN
  Administered 2013-05-18 (×2): via INTRAVENOUS

## 2013-05-18 MED ORDER — PHENYLEPHRINE 40 MCG/ML (10ML) SYRINGE FOR IV PUSH (FOR BLOOD PRESSURE SUPPORT)
PREFILLED_SYRINGE | INTRAVENOUS | Status: AC
  Filled 2013-05-18: qty 10

## 2013-05-18 MED ORDER — EPHEDRINE SULFATE 50 MG/ML IJ SOLN
INTRAMUSCULAR | Status: AC
  Filled 2013-05-18: qty 1

## 2013-05-18 MED ORDER — PROPOFOL 10 MG/ML IV BOLUS
INTRAVENOUS | Status: AC
  Filled 2013-05-18: qty 20

## 2013-05-18 SURGICAL SUPPLY — 31 items
BRUSH SUPERTRAX BIOPSY (INSTRUMENTS) ×2 IMPLANT
BRUSH SUPERTRAX NDL-TIP CYTO (INSTRUMENTS) ×2 IMPLANT
CANISTER SUCTION 2500CC (MISCELLANEOUS) ×2 IMPLANT
CHANNEL WORK EXTEND EDGE 180 (KITS) IMPLANT
CHANNEL WORK EXTEND EDGE 45 (KITS) IMPLANT
CHANNEL WORK EXTEND EDGE 90 (KITS) ×1 IMPLANT
CONT SPEC 4OZ CLIKSEAL STRL BL (MISCELLANEOUS) ×4 IMPLANT
COVER TABLE BACK 60X90 (DRAPES) ×2 IMPLANT
DRSG AQUACEL AG ADV 3.5X14 (GAUZE/BANDAGES/DRESSINGS) ×2 IMPLANT
FILTER STRAW FLUID ASPIR (MISCELLANEOUS) IMPLANT
FORCEPS BIOP SUPERTRX PREMAR (INSTRUMENTS) IMPLANT
GLOVE BIO SURGEON STRL SZ 6.5 (GLOVE) ×2 IMPLANT
GLOVE BIOGEL PI IND STRL 6.5 (GLOVE) IMPLANT
GLOVE BIOGEL PI INDICATOR 6.5 (GLOVE) ×1
KIT PROCEDURE EDGE 180 (KITS) IMPLANT
KIT PROCEDURE EDGE 45 (KITS) IMPLANT
KIT PROCEDURE EDGE 90 (KITS) IMPLANT
KIT ROOM TURNOVER OR (KITS) ×2 IMPLANT
MARKER SKIN DUAL TIP RULER LAB (MISCELLANEOUS) ×2 IMPLANT
NDL SUPERTRX PREMARK BIOPSY (NEEDLE) IMPLANT
NEEDLE SUPERTRX PREMARK BIOPSY (NEEDLE) IMPLANT
NS IRRIG 1000ML POUR BTL (IV SOLUTION) ×3 IMPLANT
OIL SILICONE PENTAX (PARTS (SERVICE/REPAIRS)) ×2 IMPLANT
PAD ARMBOARD 7.5X6 YLW CONV (MISCELLANEOUS) ×4 IMPLANT
PATCHES PATIENT (LABEL) ×6 IMPLANT
SPONGE GAUZE 4X4 12PLY (GAUZE/BANDAGES/DRESSINGS) ×2 IMPLANT
SYR 20CC LL (SYRINGE) IMPLANT
SYR 20ML ECCENTRIC (SYRINGE) ×2 IMPLANT
TOWEL OR 17X24 6PK STRL BLUE (TOWEL DISPOSABLE) ×2 IMPLANT
TRAP SPECIMEN MUCOUS 40CC (MISCELLANEOUS) ×2 IMPLANT
TUBE CONNECTING 12X1/4 (SUCTIONS) ×2 IMPLANT

## 2013-05-18 NOTE — Anesthesia Preprocedure Evaluation (Addendum)
Anesthesia Evaluation  Patient identified by MRN, date of birth, ID band Patient awake    Reviewed: Allergy & Precautions, H&P , NPO status , Patient's Chart, lab work & pertinent test results  Airway Mallampati: II TM Distance: >3 FB Neck ROM: Full    Dental  (+) Teeth Intact, Dental Advisory Given, Partial Upper   Pulmonary COPD COPD inhaler, former smoker,    + decreased breath sounds      Cardiovascular hypertension, Pt. on medications Rhythm:Regular Rate:Normal     Neuro/Psych Anxiety    GI/Hepatic GERD-  Controlled,  Endo/Other    Renal/GU      Musculoskeletal   Abdominal   Peds  Hematology   Anesthesia Other Findings   Reproductive/Obstetrics                          Anesthesia Physical Anesthesia Plan  ASA: III  Anesthesia Plan: General   Post-op Pain Management:    Induction: Intravenous  Airway Management Planned: Oral ETT  Additional Equipment:   Intra-op Plan:   Post-operative Plan: Extubation in OR  Informed Consent: I have reviewed the patients History and Physical, chart, labs and discussed the procedure including the risks, benefits and alternatives for the proposed anesthesia with the patient or authorized representative who has indicated his/her understanding and acceptance.   Dental advisory given  Plan Discussed with: CRNA, Anesthesiologist and Surgeon  Anesthesia Plan Comments: (R. Upper lobe nodule COPD/smoker Htn GERD  Plan GA with oral ETT  Roberts Gaudy, MD )       Anesthesia Quick Evaluation

## 2013-05-18 NOTE — Transfer of Care (Signed)
Immediate Anesthesia Transfer of Care Note  Patient: Adam Ward  Procedure(s) Performed: Procedure(s): VIDEO BRONCHOSCOPY WITH ENDOBRONCHIAL NAVIGATION (N/A)  Patient Location: PACU  Anesthesia Type:General  Level of Consciousness: awake, alert , oriented and patient cooperative  Airway & Oxygen Therapy: Patient Spontanous Breathing and Patient connected to nasal cannula oxygen  Post-op Assessment: Report given to PACU RN, Post -op Vital signs reviewed and stable and Patient moving all extremities X 4  Post vital signs: Reviewed and stable  Complications: No apparent anesthesia complications

## 2013-05-18 NOTE — Anesthesia Postprocedure Evaluation (Signed)
  Anesthesia Post-op Note  Patient: Coralyn Mark A Stuckert  Procedure(s) Performed: Procedure(s): VIDEO BRONCHOSCOPY WITH ENDOBRONCHIAL NAVIGATION (N/A)  Patient Location: PACU  Anesthesia Type:General  Level of Consciousness: awake, alert  and oriented  Airway and Oxygen Therapy: Patient Spontanous Breathing and Patient connected to nasal cannula oxygen  Post-op Pain: mild  Post-op Assessment: Post-op Vital signs reviewed, Patient's Cardiovascular Status Stable, Respiratory Function Stable, Patent Airway, No signs of Nausea or vomiting and Pain level controlled  Post-op Vital Signs: stable  Complications: No apparent anesthesia complications

## 2013-05-18 NOTE — H&P (Signed)
[address]       [place] [address]             [phone]          Quan A Bless Liborio Negron Torres Medical Record #[id number] Date of Birth: 1936/02/09  Referring: Purvis Kilts, MD Primary Care: Purvis Kilts, MD  Chief Complaint:    SOB/ Multiple lung lesions   History of Present Illness:    The patient is an elderly 78 year old male, referred because of abnormal CT scan of the chest. He was treated in the spring of 2013 for a COPD exacerbation and question of tuberculosis. He notes exposure to someone with tuberculosis in 1943.  Cultures were negative but CT scan showed multiple bilateral ground glass densities. Follow up scans were done in August of 2013 and January 07, 2012. I first saw him 01/25/12.  The patient has been a smoker for more than 50 years just recently switching to electronic cigarettes.   Since last seen he continues not to smoke complains mostly of his legs getting weak before he gets short of breath. Patient pulmonary reserve these in great limited, physically he has difficulty with balance and ambulation, he appears to be a very poor candidate for any surgical intervention or pulmonary resection. He was seeen in  [place] last week  to discuss dx and treatment options for the enlarging lung nodule on the right, seen by radiation oncology, Dr Tammi Klippel  Current Activity/ Functional Status: Patient is independent with mobility/ambulation, transfers, ADL's, IADL's.    Past Medical History  Diagnosis Date  . Hypertension   . High cholesterol   . Anxiety   . Cancer 1993    colon  . Rapid atrial fibrillation 07/28/2011    Paroxysmal, newly diagnosed.  . Cavitary lesion of lung 07/28/2011    AFB smears x3 negative.  . Acute respiratory failure with hypoxia 07/26/2011  . Bulla of lung 07/28/2011  . Tobacco abuse 08/01/2011  . COPD (chronic obstructive pulmonary disease)   . Pneumonia   . Numbness and tingling     Hx: of in right hand  and B/LLE  . GERD (gastroesophageal reflux disease)     Hx: of  . Headache(784.0)     Past Surgical History  Procedure Laterality Date  . Colon surgery    . Cervical disc surgery    . Tonsillectomy    . Appendectomy    . Eye surgery      as a child    Family History  Problem Relation Age of Onset  . Heart disease Mother   . Stroke Father   . Pneumonia Sister   . Heart disease Brother     History   Social History  . Marital Status: Married    Spouse Name: N/A    Number of Children: N/A  . Years of Education: N/A   Occupational History  . Worked for [employer]   Social History Main Topics  . Smoking status: Former Smoker    Quit date: 07/16/2011  . Smokeless tobacco: Not on file     Comment: started using electric ciggerette in 06-2010  . Alcohol Use: No  . Drug Use: No  . Sexually Active: Not on file      History  Smoking status  . Former Smoker  . Quit date: 07/16/2011  Smokeless tobacco  . Not on file    Comment: started using electric ciggerette in 06-2010  History  Alcohol Use No     Allergies  Allergen Reactions  . Celebrex [Celecoxib] Other (See Comments)    Unknown   . Tetracyclines & Related Hives    Current Facility-Administered Medications  Medication Dose Route Frequency Provider Last Rate Last Dose  . lactated ringers infusion   Intravenous Continuous [name], MD 10 mL/hr at 05/18/13 0940         Review of Systems:     Cardiac Review of Systems: Y or N  Chest Pain [ n   ]  Resting SOB [ n  ] Exertional SOB  [ y ]  Cardell Peach  ]   Pedal Edema [  n ]    Palpitations [n  ] Syncope  [ n ]   Presyncope n   ]  General Review of Systems: [Y] = yes [  ]=no Constitional: recent weight change [n  ]; anorexia [ n ]; fatigue [ n ]; nausea [  ]; night sweats [ n ]; fever [ n ]; or chills [ n ];                                                                                                                                             Eye : blurred vision [  ]; diplopia [   ]; vision changes [  ];  Amaurosis fugax[  ]; Resp: cough [ y ];  wheezing[y  ];  hemoptysis[n  ]; shortness of breath[  ]; paroxysmal nocturnal dyspnea[  ]; dyspnea on exertion[ y ]; or orthopnea[  ];  GI:  gallstones[ n ], vomiting[  ];  dysphagia[  ]; melena[  ];  hematochezia [  ]; heartburn[  ];   Hx of  Colonoscopy[  ]; GU: kidney stones [  ]; hematuria[  ];   dysuria [  ];  nocturia[  ];  history of     obstruction [  ];             Skin: rash, swelling[  ];, hair loss[  ];  peripheral edema[  ];  or itching[  ]; Musculosketetal: myalgias[  ];  joint swelling[  ];  joint erythema[  ];  joint pain[  ];  back pain[  ];  Heme/Lymph: bruising[  ];  bleeding[  ];  anemia[  ];  Neuro: TIA[  ];  headaches[  ];  stroke[  ];  vertigo[  ];  seizures[  ];   paresthesias[  ];  difficulty walking[  ];  Psych:depression[  ]; anxiety[  ];  Endocrine: diabetes[ y ];  thyroid dysfunction[  ];  Immunizations: Flu [ y ]; Pneumococcal[y  ]; shingles Y  Other:  Physical Exam: Wt Readings from Last 3 Encounters:  05/12/13 146 lb 6.4 oz (66.407 kg)  05/05/13 147 lb (66.679 kg)  01/04/13 143 lb (64.864 kg)  Temp Readings from Last 3 Encounters:  05/18/13 97.7 F (36.5 C) Oral  05/18/13 97.7 F (36.5 C) Oral  05/12/13 97.9 F (36.6 C) Oral   BP Readings from Last 3 Encounters:  05/18/13 191/77  05/18/13 191/77  05/12/13 124/73   Pulse Readings from Last 3 Encounters:  05/18/13 76  05/18/13 76  05/12/13 73    BP 191/77  Pulse 76  Temp(Src) 97.7 F (36.5 C) (Oral)  Resp 20  SpO2 96% General appearance: alert, cooperative, appears older than stated age, distracted and no distress Neurologic: intact Heart: regular rate and rhythm, S1, S2 normal, no murmur, click, rub or gallop and normal apical impulse Lungs: diminished breath sounds bilaterally Abdomen: soft, non-tender; bowel sounds normal; no masses,  no organomegaly Extremities:  extremities normal, atraumatic, no cyanosis or edema and Homans sign is negative, no sign of DVT No carotid bruits, pedal pulses are present dp & pt bilaterial  Diagnostic Studies & Laboratory data:     Recent Radiology Findings:  Dg Chest 2 View Within Previous 72 Hours.  Films Obtained On Friday Are Acceptable For Monday And Tuesday Cases  05/18/2013   CLINICAL DATA:  Bronchoscopy.  Neoplasm uncertain behavior of Lung  EXAM: CHEST  2 VIEW  COMPARISON:  CT chest 05/05/2013  FINDINGS: COPD with hyperinflation of the lungs. Heart size is normal and the vascularity is normal.  Focal right upper lobe airspace disease is similar to the prior CT. This could represent scar or neoplasm.  IMPRESSION: COPD.  Right upper lobe density unchanged from recent CT. This could represent scar or neoplasm.   Electronically Signed   By: Franchot Gallo M.D.   On: 05/18/2013 09:47   Ct Chest Wo Contrast  05/05/2013   CLINICAL DATA:  Follow-up lung nodule. History of colon cancer 1993.  EXAM: CT CHEST WITHOUT CONTRAST  TECHNIQUE: Multidetector CT imaging of the chest was performed following the standard protocol without IV contrast.  COMPARISON:  CT CHEST W/O CM dated 11/25/2012  FINDINGS: Subcentimeter ground-glass opacities in the right lung apex again seen. Slightly more consolidated ill-defined ground-glass nodular density measuring 31 x 22 mm, previously 33 x 23 mm, relatively unchanged. Right upper lobe anterior segment 13 x 15 mm ground-glass opacity was 10 x 12 mm. Minimal linear scarring in the right lung base. 12 x 12 mm ground-glass consolidation in left upper lobe, anterior segment was 16 x 13 mm.  Moderate centrilobular emphysema. Trace left apical calcified pleural plaque with underlying nodular scarring. No pleural effusions or focal consolidations. Increased lung volumes. Left perihilar air cyst/bleb. No pneumothorax.  Heart size is normal, pericardium is nonsuspicious. Thoracic aorta is normal course and caliber  with moderate calcific atherosclerosis. 8 mm short axis precarinal lymph node is similar, 7 mm aortopulmonary window lymph node is unchanged. Thoracic esophagus is unremarkable. Included view of the abdomen is nonsuspicious.  Scattered thoracic small resonance. Soft tissues are nonsuspicious. Partially imaged ACDF.  IMPRESSION: Multiple sub solid pulmonary nodules again seen, with increased size of a right upper lobe sub solid pulmonary nodule (previously 10 x 12 mm, now 13 x 15 mm). Considering interval growth, as previously reported prominent an indolent neoplasm such as low-grade adenocarcinoma may have this appearance. However, slightly decreased size of left upper lobe sub solid nodule.  Moderate centrilobular emphysema.  No acute cardiopulmonary process.   Electronically Signed   By: Elon Alas   On: 05/05/2013 14:27   Ct Chest Wo Contrast  11/25/2012   CLINICAL DATA:  Followup of pulmonary nodules. Ex-smoker. History of remote colon cancer.  EXAM: CT CHEST WITHOUT CONTRAST  TECHNIQUE: Multidetector CT imaging of the chest was performed following the standard protocol without IV contrast.  COMPARISON:  05/21/2012 and back to 07/27/2011.  FINDINGS: Lungs/Pleura: Moderate centrilobular emphysema. Biapical pleural parenchymal scarring.  Peripheral right upper lobe ground-glass nodule measures 2.3 x 3.3 cm on image 23/series 4. 1.6 x 3.3 cm at the same level on the prior exam. Appears similar on today's sagittal image 30, and grossly similar on 01/07/2012.  Sub solid right upper lobe nodule which measures 1.0 x 1.2 cm on image 27 versus 0.9 x 1.0 cm on the prior exam. Definitely enlarged since 07/27/2011.  Left upper lobe central ground-glass nodule which measures on the order of 1.3 x 1.6 cm on image 25/series 4. 1.6 x 1.7 cm on the prior exam, suggesting stability.  Scattered areas of scarring, some of which are somewhat nodular. No pleural fluid.  Heart/Mediastinum: Dense aortic and great vessel  atherosclerosis. Normal heart size, without pericardial effusion. Coronary artery atherosclerosis. Small stable middle mediastinal and prevascular nodes, without mediastinal or hilar adenopathy.  Upper abdomen: Advanced atherosclerosis. Normal adrenal glands.  Bones/Musculoskeletal: Lower cervical spine fixation. No acute osseous abnormality.  IMPRESSION: 1. Multiple sub solid pulmonary nodules. An anterior right upper lobe nodule has definitely enlarged when compared back to 07/27/2011. This is suspicious for an indolent neoplasm such is low-grade adenocarcinoma. Presuming this is not sampled or resected, recommend special attention on followup. 2. The more peripheral right upper lobe sub solid lesion measures slightly larger today, favored to be due to differences in slice selection. This also warrants special followup attention. 3. Atherosclerosis, including within the coronary arteries. 4. Centrilobular emphysema.   Electronically Signed   By: Abigail Miyamoto   On: 11/25/2012 14:42   Ct Super D Chest Wo Contrast  05/21/2012  *RADIOLOGY REPORT*  Clinical Data:  Evaluate lung nodule.  CHEST CT WITHOUT CONTRAST  Technique:  Multidetector CT imaging of the chest was performed following the standard protocol without intravenous contrast.  Comparison:  Chest CT 01/07/2012.  Findings:  Mediastinum: Heart size is normal. There is no significant pericardial fluid, thickening or pericardial calcification. No pathologically enlarged mediastinal or hilar lymph nodes. Please note that accurate exclusion of hilar adenopathy is limited on noncontrast CT scans.  A small hiatal hernia. There is atherosclerosis of the thoracic aorta, the great vessels of the mediastinum and the coronary arteries, including calcified atherosclerotic plaque in the left anterior descending coronary artery. Calcifications of the aortic valve.  Lungs/Pleura: Again noted are multiple areas of ground-glass attenuation scattered throughout the lungs  bilaterally.  Many of these are ill-defined, while others are slightly nodular in appearance.  The largest somewhat nodular area of ground-glass attenuation and reticulation is in the periphery of the right upper lobe (image 21 of series 4), which appears slightly less bulky than the prior examination 01/07/2012, currently measuring 3.3 x 1.6 cm (previously 3.1 x 1.9 cm).  Other notable lesions include a 9 mm lesion in the anterior aspect of the right upper lobe (image 25 of series 4), and a faint 1.6 x 1.7 cm lesion in the anterior aspect of the left upper lobe (image 26 of series 4), both of which appear unchanged.  There is an increasing nodular density measuring 6 mm in the posterior aspect of the right upper lobe shortly above the major fissure (image 21 of series 4), which is nonspecific, but favored to represent  an area of developing pleural scarring.  Mild diffuse bronchial wall thickening with mild centrilobular and paraseptal emphysema.  Bilateral apical nodular pleural parenchymal scarring is unchanged.  No acute consolidative airspace disease. No pleural effusions.  Upper Abdomen: Extensive atherosclerosis.  Musculoskeletal: Orthopedic fixation hardware in the lower cervical spine incompletely visualized. There are no aggressive appearing lytic or blastic lesions noted in the visualized portions of the skeleton.  A small sclerotic lesion in the inferior aspect of the right scapula is unchanged, most compatible with a bone island.  IMPRESSION: 1.  Overall, the appearance of the chest is very similar to prior examinations with multiple subsolid nodular areas scattered throughout the lungs bilaterally, as above.  The majority of these are unchanged, while the largest one in the periphery of the right upper lobe appears slightly less bulky than the prior examination. Continued attention on follow-up studies is recommended to exclude progression of disease, as slow-growing adenocarcinoma(s) are not excluded.  2.  Atherosclerosis, including left anterior descending coronary artery disease. Assessment for potential risk factor modification, dietary therapy or pharmacologic therapy may be warranted, if clinically indicated. 3.  Mild diffuse bronchial wall thickening with mild centrilobular and paraseptal emphysema. 4.  Additional incidental findings, as above.   Original Report Authenticated By: Vinnie Langton, M.D.    Ct Chest Wo Contrast  01/07/2012  *RADIOLOGY REPORT*  Clinical Data: Follow-up pulmonary nodules.  History of chronic airway obstruction and aspiration pneumonia.  No history of malignancy.  CT CHEST WITHOUT CONTRAST  Technique:  Multidetector CT imaging of the chest was performed following the standard protocol without IV contrast.  Comparison: Prior chest CTs dated 10/02/2011 and 07/27/2011.  Findings: The mediastinum has a stable appearance with unchanged normal sinus mediastinal lymph nodes.  There is scattered atherosclerosis of the great vessels, aorta and coronary arteries. There is no pleural or pericardial effusion.  Compared with the original study, the airway thickening and patchy peribronchial opacities within the right lower lobe have resolved. These were likely inflammatory.  However, there are several persistent mixed solid and ground-glass densities.  The largest is positioned peripherally in the right upper lobe and measures approximately 3.1 x 1.9 cm on image 19.  A 9 mm mixed ground-glass opacity in the right upper lobe on image 23 has slightly enlarged. A faint pure ground-glass opacity in the right lower lobe on image 39 is stable.  Likewise, there is a stable ground-glass opacity in the left upper lobe on image 21.  Biapical scarring is stable.  The visualized upper abdomen has a stable appearance.  There are multiple calcified lymph nodes within the porta hepatis.  IMPRESSION:  1.  Compared with the most recent examination of 3 months ago, no significant changes are identified.  No  acute process is seen. 2.  There are persistent mixed solid and ground-glass opacities in the right upper lobe as well as additional pure ground-glass opacities.  Although possibly postinflammatory, adenocarcinoma cannot be excluded. Thoracic surgical consultation should be considered.  Followup by CT is recommended in 12 months, with continued annual surveillance for a minimum of 3 years.  These recommendations are taken from "Recommendations for the Management of Subsolid Pulmonary Nodules Detected at CT:  A Statement from the [facility]" Radiology 2013; 266:1, 304- 317.   Original Report Authenticated By: Richardean Sale, M.D.     Recent Lab Findings: Lab Results  Component Value Date   WBC 7.6 05/18/2013   HGB 15.4 05/18/2013   HCT 46.2 05/18/2013  PLT 189 05/18/2013   GLUCOSE 101* 05/18/2013   CHOL 160 07/06/2012   TRIG 97 07/06/2012   HDL 48 07/06/2012   LDLCALC 93 07/06/2012   ALT 9 05/18/2013   AST 18 05/18/2013   NA 137 05/18/2013   K 4.2 05/18/2013   CL 97 05/18/2013   CREATININE 0.86 05/18/2013   BUN 9 05/18/2013   CO2 30 05/18/2013   TSH 1.631 07/28/2011   INR 0.86 05/18/2013   HGBA1C 6.0* 07/27/2011   PFT's  FEV1 1.33 40%  DLCO  13.33 38%   Assessment / Plan:   Patient who because of his underlying pulmonary limitations would be a very poor operative candidate now presents with slowly enlarging dominant right lung groundglass opacity. I discussed with the patient at this is likely carcinoma of the lung. We discussed various treatment options. At this point we'll proceed with pulmonary function studies to confirm my impression that his underlying emphysematous disease is severe enough to limit any surgical intervention. Patient present  at the  [facility] conference and has  seen in [facility]  clinic to discuss treatment options specifically with stereotactic radiotherapy in mind.  The right upper lobe nodule which has been documented to have enlarged could be treated with stereotatic radiation  therpy. Patient is agreeable to treatment with radiation if needed. I have discussed with him ENB / broncoscopy with bx to obtain a tissue dx. Will plan to proceed today.  The goals risks and alternatives of the planned surgical procedure Bronchoscopy, ENB, Lung Biopsy  have been discussed with the patient in detail. The risks of the procedure including death, infection, stroke, myocardial infarction, bleeding, blood transfusion have all been discussed specifically.  I have quoted Adam Ward a 1% of perioperative mortality and a complication rate as high as 10 %. The patient's questions have been answered.Adam Ward is willing  to proceed with the planned procedure.  [name] MD  Beeper [phone] Office [phone]

## 2013-05-18 NOTE — Anesthesia Procedure Notes (Signed)
Procedure Name: Intubation Date/Time: 05/18/2013 1:13 PM Performed by: Carney Living Pre-anesthesia Checklist: Patient identified, Emergency Drugs available, Suction available, Patient being monitored and Timeout performed Patient Re-evaluated:Patient Re-evaluated prior to inductionOxygen Delivery Method: Circle system utilized Preoxygenation: Pre-oxygenation with 100% oxygen Intubation Type: IV induction Ventilation: Mask ventilation without difficulty Laryngoscope Size: Mac and 4 Grade View: Grade I Tube type: Oral Tube size: 8.0 mm Number of attempts: 1 Airway Equipment and Method: Stylet Placement Confirmation: ETT inserted through vocal cords under direct vision,  positive ETCO2 and breath sounds checked- equal and bilateral Secured at: 22 cm Tube secured with: Tape Dental Injury: Teeth and Oropharynx as per pre-operative assessment

## 2013-05-18 NOTE — Preoperative (Signed)
Beta Blockers   Reason not to administer Beta Blockers:Not Applicable 

## 2013-05-18 NOTE — Discharge Instructions (Signed)
Flexible Bronchoscopy, Care After Refer to this sheet in the next few weeks. These instructions provide you with information on caring for yourself after your procedure. Your health care provider may also give you more specific instructions. Your treatment has been planned according to current medical practices, but problems sometimes occur. Call your health care provider if you have any problems or questions after your procedure.  WHAT TO EXPECT AFTER THE PROCEDURE It is normal to have the following symptoms for 24 48 hours after the procedure:   Increased cough.  Low-grade fever.  Sore throat or hoarse voice.  Small streaks of blood in your thick spit (sputum), if tissue samples were taken (biopsy). HOME CARE INSTRUCTIONS   Do not eat or drink anything for 2 hours after your procedure. Your nose and throat were numbed by medicine. If you try to eat or drink before the medicine wears off, food or drink could go into your lungs or you could burn yourself. After the numbness is gone and your cough and gag reflexes have returned, you may eat soft food and drink liquids slowly.   The day after the procedure, you can go back to your normal diet.   You may resume normal activities.   Follow up with your health care provider as directed. It is important to keep all follow-up appointments, especially if tissue samples were taken for testing (biopsy). SEEK IMMEDIATE MEDICAL CARE IF:   You have increasing shortness of breath.   You become lightheaded or faint.   You have chest pain.   You have any new concerning symptoms.  You cough up more than a small amount of blood.  The amount of blood you cough up increases. MAKE SURE YOU:  Understand these instructions.  Will watch your condition.  Will get help right away if you are not doing well or get worse. Document Released: 09/06/2004 Document Revised: 12/08/2012 Document Reviewed: 10/22/2012 [facility] Patient Information 2014  [place].

## 2013-05-18 NOTE — Brief Op Note (Signed)
      [address]       [place] [address]             [phone]      05/18/2013  2:42 PM  PATIENT:  Adam Ward  78 y.o. male  PRE-OPERATIVE DIAGNOSIS:   RIGHT UPPER LOBE LUNG LESION x 2   POST-OPERATIVE DIAGNOSIS:  RIGHT UPPER LOBE LUNG LESION x2 / path pending   PROCEDURE:  Procedure(s): VIDEO BRONCHOSCOPY WITH ENDOBRONCHIAL NAVIGATION (N/A) Lung Biopsy  SURGEON:  Surgeon(s) and Role:    * [name], MD - Primary    ANESTHESIA:   general  EBL:  Total I/O In: 1000 [I.V.:1000] Out: -   BLOOD ADMINISTERED:none  DRAINS: none   LOCAL MEDICATIONS USED:  NONE  SPECIMEN:  Source of Specimen:  right upper lobe medical lesion and rt upper lobe laterial lesion  DISPOSITION OF SPECIMEN:  PATHOLOGY  COUNTS:  YES   DICTATION: .Dragon Dictation  PLAN OF CARE: Discharge to home after PACU  PATIENT DISPOSITION:  PACU - hemodynamically stable.   Delay start of Pharmacological VTE agent (>24hrs) due to surgical blood loss or risk of bleeding: yes

## 2013-05-19 ENCOUNTER — Encounter: Payer: Self-pay | Admitting: *Deleted

## 2013-05-19 NOTE — Telephone Encounter (Signed)
I think at dose he has been taking it is OK But if concerned then he can switch to lipitor 10 mg. Would f/u lipid panel and AST in 8 wks.

## 2013-05-19 NOTE — [facility] Oncology Navigator Note (Signed)
I called patient to check in following ENB 05/18/13.  Patient reports that he is doing fairly well today.  He has chronic neck pain which he says feels worse today.  We discussed that it may be related to positioning during his procedure.  He takes tylenol as needed with some relief.  He asked me to talk to his wife who reports that he is doing well.  He is a little weaker today.  I reminded them of their appointment with Dr. Servando Snare tomorrow.  She denied any questions or concerns at this time.  I verified that she had my contact information and encouraged her to call me for any needs.

## 2013-05-19 NOTE — Op Note (Signed)
NAME:  DECKARD, STUBER NO.:  [id number]  MEDICAL RECORD NO.:  [id number]  LOCATION:  [facility]                         FACILITY:  [facility]  PHYSICIAN:  Lanelle Bal, MD    DATE OF BIRTH:  [birth year]  DATE OF PROCEDURE:  05/18/2013 DATE OF DISCHARGE:  05/18/2013                              OPERATIVE REPORT   PREOPERATIVE DIAGNOSIS:  Multiple lung lesions.  POSTOPERATIVE DIAGNOSIS:  Multiple lung lesions.  Final pathology pending.  PROCEDURE PERFORMED:  Video bronchoscopy with electromagnetic navigation bronchoscopy and transbronchial biopsy of two right upper lobe lung lesions.  SURGEON:  Lanelle Bal, MD  BRIEF HISTORY:  The patient is a 78 year old male with significant underlying pulmonary disease, who would not be an operative candidate for surgical resection.  He has had been followed with serial CTs because of multiple lung lesions that have waxed and waned over time.  I first saw him in late October 2014, and a CT scan was performed at that time.  He returned with a followup scan 4 months later.  Over that with the changes on the most current CT scan and reviewing his old scans, some lesions on the left side had actually decreased in size.  A right upper lobe lesion more medially positioned, had become slightly enlarged and definitely more dense.  There is also a more diffuse, peripherally based right upper lobe lung lesion, each of the two lesions, but primarily the more medial one that had the greatest change or suspicious for indolent lung cancer.  Risks and options of biopsy were discussed with the patient and he agreed and signed informed consent for bronchoscopy with navigational bronchoscopy and biopsy.  DESCRIPTION OF PROCEDURE:  The patient underwent general endotracheal anesthesia without incident.  Appropriate time-out was performed. Through the single-lumen endotracheal tube, a fiberoptic bronchoscope was passed to the subsegmental level,  both in the right and left tracheobronchial tree.  No endobronchial lesions were appreciated.  The scope was then positioned with the ENB probe in place and registration of the navigation software and CT scan were performed.  Prior to surgery, a navigation plan was developed with the first target being the more medial right upper lobe lesion.  This was labeled in the pathology report as navigation #1, right upper lobe lesion.  We were able to navigate the working channel to this lesion with good airway and adequate positioning of the working channel.  We then proceeded with needle brush and initial cytology findings, and slides were sent to Pathology for review.  Additional aspirations and biopsies of this area were performed and also sent to Pathology.  We then repositioned the scope, rechecking position with the ENB probe and some additional biopsies from the medial right upper lobe lesion, labeled navigation #2. We then repositioned our working channel with a different target, the more peripherally located right upper lobe lesion.  Again, positioning was good, and needle brushes, aspirations and biopsies were obtained, labeled navigation #3, right upper lobe lateral lesion.  We had developed two pathways to this lesion.  The probe was then positioned to the second pathway.  An additional biopsies, aspiration and needle brushes were obtained and  labeled navigation #4, right upper lobe lateral lesion.  Each of these were submitted to Pathology for permanence.  The scope was then removed. After completion of the procedure, there was no significant endobronchial bleeding.  The patient was extubated in the operating room having tolerated the procedure without obvious complication and was transferred to the recovery room for further postoperative care.     Lanelle Bal, MD     EG/MEDQ  D:  05/19/2013  T:  05/19/2013  Job:  [id number]

## 2013-05-19 NOTE — Telephone Encounter (Signed)
Spoke with pt wife, Aware of dr Harrington Challenger recommendation. Pt is going to restart the simvastatin.

## 2013-05-20 ENCOUNTER — Encounter (HOSPITAL_COMMUNITY): Payer: Self-pay | Admitting: Cardiothoracic Surgery

## 2013-05-20 ENCOUNTER — Ambulatory Visit (INDEPENDENT_AMBULATORY_CARE_PROVIDER_SITE_OTHER): Payer: Medicare Other | Admitting: Cardiothoracic Surgery

## 2013-05-20 VITALS — BP 96/62 | HR 82 | Resp 20 | Ht 73.0 in | Wt 146.0 lb

## 2013-05-20 DIAGNOSIS — Z7189 Other specified counseling: Secondary | ICD-10-CM

## 2013-05-20 DIAGNOSIS — Z9889 Other specified postprocedural states: Secondary | ICD-10-CM | POA: Diagnosis not present

## 2013-05-20 DIAGNOSIS — D381 Neoplasm of uncertain behavior of trachea, bronchus and lung: Secondary | ICD-10-CM | POA: Diagnosis not present

## 2013-05-20 DIAGNOSIS — Z712 Person consulting for explanation of examination or test findings: Secondary | ICD-10-CM

## 2013-05-22 NOTE — Progress Notes (Signed)
[address]       [place] [address]             [phone]    Kentley A Sperbeck  [facility] Medical Record #[id number] Date of Birth: Dec 05, 1935  Referring: Halford Chessman, MD Primary Care: Purvis Kilts, MD  Chief Complaint:    SOB/ Multiple lung lesions   History of Present Illness:    The patient is an elderly appearing  78 year old male, referred because of abnormal CT scan of the chest. He was treated in the spring of 2013 for a COPD exacerbation and question of tuberculosis. He notes exposure to someone with tuberculosis in 1943.  Cultures were negative but CT scan showed multiple bilateral ground glass densities. Follow up scans were done in August of 2013 and January 07, 2012. I first saw him 01/25/12.  The patient has been a smoker for more than 50 years just recently switching to electronic cigarettes.   Since last seen he continues not to smoke complains mostly of his legs getting weak before he gets short of breath. Patient pulmonary reserve these in great limited, physically he has difficulty with balance and ambulation, he appears to be a very poor candidate for any surgical intervention or pulmonary resection.  He under went ENB with bx two days ago and returns today to review findings.  Current Activity/ Functional Status: Patient is independent with mobility/ambulation, transfers, ADL's, IADL's.  Zubrod Score: At the time of surgery this patient's most appropriate activity status/level should be described as: []     0    Normal activity, no symptoms []     1    Restricted in physical strenuous activity but ambulatory, able to do out light work [x]     2    Ambulatory and capable of self care, unable to do work activities, up and about >50 % of waking hours                              []     3    Only limited self care, in bed greater than 50% of waking hours []     4    Completely disabled, no self care, confined to bed or chair []     5     Moribund   Past Medical History  Diagnosis Date  . Hypertension   . High cholesterol   . Anxiety   . Cancer 1993    colon  . Rapid atrial fibrillation 07/28/2011    Paroxysmal, newly diagnosed.  . Cavitary lesion of lung 07/28/2011    AFB smears x3 negative.  . Acute respiratory failure with hypoxia 07/26/2011  . Bulla of lung 07/28/2011  . Tobacco abuse 08/01/2011  . COPD (chronic obstructive pulmonary disease)   . Pneumonia   . Numbness and tingling     Hx: of in right hand and B/LLE  . GERD (gastroesophageal reflux disease)     Hx: of  . Headache(784.0)     Past Surgical History  Procedure Laterality Date  . Colon surgery    . Cervical disc surgery    . Tonsillectomy    . Appendectomy    . Eye surgery      as a child  . Video bronchoscopy with endobronchial navigation N/A 05/18/2013    Procedure: VIDEO BRONCHOSCOPY WITH ENDOBRONCHIAL NAVIGATION;  Surgeon: [name], MD;  Location: [facility];  Service: Thoracic;  Laterality: N/A;    Family History  Problem Relation Age of Onset  . Heart disease Mother   . Stroke Father   . Pneumonia Sister   . Heart disease Brother     History   Social History  . Marital Status: Married    Spouse Name: N/A    Number of Children: N/A  . Years of Education: N/A   Occupational History  . Worked for [employer]   Social History Main Topics  . Smoking status: Former Smoker    Quit date: 07/16/2011  . Smokeless tobacco: Not on file     Comment: started using electric ciggerette in 06-2010  . Alcohol Use: No  . Drug Use: No  . Sexually Active: Not on file      History  Smoking status  . Former Smoker  . Quit date: 07/16/2011  Smokeless tobacco  . Not on file    Comment: started using electric ciggerette in 06-2010    History  Alcohol Use No     Allergies  Allergen Reactions  . Celebrex [Celecoxib] Other (See Comments)    Unknown   . Tetracyclines & Related Hives    Current Outpatient Prescriptions    Medication Sig Dispense Refill  . alprazolam (XANAX) 2 MG tablet Take 2 mg by mouth 3 (three) times daily.      Marland Kitchen aspirin EC 81 MG tablet Take 81 mg by mouth daily.      . digoxin (LANOXIN) 0.25 MG tablet Take 0.25 mg by mouth daily.      Marland Kitchen diltiazem (CARDIZEM CD) 240 MG 24 hr capsule Take 240 mg by mouth daily.      . Multiple Vitamins-Minerals (MULTIVITAMIN WITH MINERALS) tablet Take 1 tablet by mouth daily.      . Omega-3 Fatty Acids (FISH OIL) 1000 MG CAPS Take 1 capsule by mouth daily.      Marland Kitchen PROAIR HFA 108 (90 BASE) MCG/ACT inhaler Inhale 1 puff into the lungs every 4 (four) hours as needed for wheezing or shortness of breath.       . Saw Palmetto, Serenoa repens, (SAW PALMETTO PO) Take 1 tablet by mouth 2 (two) times daily.      . simvastatin (ZOCOR) 20 MG tablet Take 20 mg by mouth daily.       No current facility-administered medications for this visit.       Review of Systems:     Cardiac Review of Systems: Y or N  Chest Pain [ n   ]  Resting SOB [ n  ] Exertional SOB  [ y ]  Vertell Limber Florencio.Farrier  ]   Pedal Edema [  n ]    Palpitations [n  ] Syncope  [ n ]   Presyncope n   ]  General Review of Systems: [Y] = yes [  ]=no Constitional: recent weight change [n  ]; anorexia [ n ]; fatigue [ n ]; nausea [  ]; night sweats [ n ]; fever [ n ]; or chills [ n ];  Eye : blurred vision [  ]; diplopia [   ]; vision changes [  ];  Amaurosis fugax[  ]; Resp: cough [ y ];  wheezing[y  ];  hemoptysis[n  ]; shortness of breath[  ]; paroxysmal nocturnal dyspnea[  ]; dyspnea on exertion[ y ]; or orthopnea[  ];  GI:  gallstones[ n ], vomiting[  ];  dysphagia[  ]; melena[  ];  hematochezia [  ]; heartburn[  ];   Hx of  Colonoscopy[  ]; GU: kidney stones [  ]; hematuria[  ];   dysuria [  ];  nocturia[  ];  history of     obstruction [  ];             Skin: rash,  swelling[  ];, hair loss[  ];  peripheral edema[  ];  or itching[  ]; Musculosketetal: myalgias[  ];  joint swelling[  ];  joint erythema[  ];  joint pain[  ];  back pain[  ];  Heme/Lymph: bruising[  ];  bleeding[  ];  anemia[  ];  Neuro: TIA[  ];  headaches[  ];  stroke[  ];  vertigo[  ];  seizures[  ];   paresthesias[  ];  difficulty walking[  ];  Psych:depression[  ]; anxiety[  ];  Endocrine: diabetes[ y ];  thyroid dysfunction[  ];  Immunizations: Flu [ y ]; Pneumococcal[y  ]; shingles Y  Other:  Physical Exam: BP 96/62  Pulse 82  Resp 20  Ht 6\' 1"  (1.854 m)  Wt 146 lb (66.225 kg)  BMI 19.27 kg/m2  SpO2 92%  General appearance: alert, cooperative, appears older than stated age, distracted and no distress Neurologic: intact Heart: regular rate and rhythm, S1, S2 normal, no murmur, click, rub or gallop and normal apical impulse Lungs: diminished breath sounds bilaterally Abdomen: soft, non-tender; bowel sounds normal; no masses,  no organomegaly Extremities: extremities normal, atraumatic, no cyanosis or edema and Homans sign is negative, no sign of DVT No carotid bruits, pedal pulses are present dp & pt bilaterial  Diagnostic Studies & Laboratory data:     Recent Radiology Findings:  Ct Chest Wo Contrast  05/05/2013   CLINICAL DATA:  Follow-up lung nodule. History of colon cancer 1993.  EXAM: CT CHEST WITHOUT CONTRAST  TECHNIQUE: Multidetector CT imaging of the chest was performed following the standard protocol without IV contrast.  COMPARISON:  CT CHEST W/O CM dated 11/25/2012  FINDINGS: Subcentimeter ground-glass opacities in the right lung apex again seen. Slightly more consolidated ill-defined ground-glass nodular density measuring 31 x 22 mm, previously 33 x 23 mm, relatively unchanged. Right upper lobe anterior segment 13 x 15 mm ground-glass opacity was 10 x 12 mm. Minimal linear scarring in the right lung base. 12 x 12 mm ground-glass consolidation in left upper lobe,  anterior segment was 16 x 13 mm.  Moderate centrilobular emphysema. Trace left apical calcified pleural plaque with underlying nodular scarring. No pleural effusions or focal consolidations. Increased lung volumes. Left perihilar air cyst/bleb. No pneumothorax.  Heart size is normal, pericardium is nonsuspicious. Thoracic aorta is normal course and caliber with moderate calcific atherosclerosis. 8 mm short axis precarinal lymph node is similar, 7 mm aortopulmonary window lymph node is unchanged. Thoracic esophagus is unremarkable. Included view of the abdomen is nonsuspicious.  Scattered thoracic small resonance. Soft tissues are nonsuspicious. Partially imaged ACDF.  IMPRESSION: Multiple sub solid pulmonary nodules again seen, with increased size of a right upper lobe sub solid pulmonary  nodule (previously 10 x 12 mm, now 13 x 15 mm). Considering interval growth, as previously reported prominent an indolent neoplasm such as low-grade adenocarcinoma may have this appearance. However, slightly decreased size of left upper lobe sub solid nodule.  Moderate centrilobular emphysema.  No acute cardiopulmonary process.   Electronically Signed   By: Elon Alas   On: 05/05/2013 14:27   Ct Chest Wo Contrast  11/25/2012   CLINICAL DATA:  Followup of pulmonary nodules. Ex-smoker. History of remote colon cancer.  EXAM: CT CHEST WITHOUT CONTRAST  TECHNIQUE: Multidetector CT imaging of the chest was performed following the standard protocol without IV contrast.  COMPARISON:  05/21/2012 and back to 07/27/2011.  FINDINGS: Lungs/Pleura: Moderate centrilobular emphysema. Biapical pleural parenchymal scarring.  Peripheral right upper lobe ground-glass nodule measures 2.3 x 3.3 cm on image 23/series 4. 1.6 x 3.3 cm at the same level on the prior exam. Appears similar on today's sagittal image 30, and grossly similar on 01/07/2012.  Sub solid right upper lobe nodule which measures 1.0 x 1.2 cm on image 27 versus 0.9 x 1.0 cm on  the prior exam. Definitely enlarged since 07/27/2011.  Left upper lobe central ground-glass nodule which measures on the order of 1.3 x 1.6 cm on image 25/series 4. 1.6 x 1.7 cm on the prior exam, suggesting stability.  Scattered areas of scarring, some of which are somewhat nodular. No pleural fluid.  Heart/Mediastinum: Dense aortic and great vessel atherosclerosis. Normal heart size, without pericardial effusion. Coronary artery atherosclerosis. Small stable middle mediastinal and prevascular nodes, without mediastinal or hilar adenopathy.  Upper abdomen: Advanced atherosclerosis. Normal adrenal glands.  Bones/Musculoskeletal: Lower cervical spine fixation. No acute osseous abnormality.  IMPRESSION: 1. Multiple sub solid pulmonary nodules. An anterior right upper lobe nodule has definitely enlarged when compared back to 07/27/2011. This is suspicious for an indolent neoplasm such is low-grade adenocarcinoma. Presuming this is not sampled or resected, recommend special attention on followup. 2. The more peripheral right upper lobe sub solid lesion measures slightly larger today, favored to be due to differences in slice selection. This also warrants special followup attention. 3. Atherosclerosis, including within the coronary arteries. 4. Centrilobular emphysema.   Electronically Signed   By: Abigail Miyamoto   On: 11/25/2012 14:42   Ct Super D Chest Wo Contrast  05/21/2012  *RADIOLOGY REPORT*  Clinical Data:  Evaluate lung nodule.  CHEST CT WITHOUT CONTRAST  Technique:  Multidetector CT imaging of the chest was performed following the standard protocol without intravenous contrast.  Comparison:  Chest CT 01/07/2012.  Findings:  Mediastinum: Heart size is normal. There is no significant pericardial fluid, thickening or pericardial calcification. No pathologically enlarged mediastinal or hilar lymph nodes. Please note that accurate exclusion of hilar adenopathy is limited on noncontrast CT scans.  A small hiatal  hernia. There is atherosclerosis of the thoracic aorta, the great vessels of the mediastinum and the coronary arteries, including calcified atherosclerotic plaque in the left anterior descending coronary artery. Calcifications of the aortic valve.  Lungs/Pleura: Again noted are multiple areas of ground-glass attenuation scattered throughout the lungs bilaterally.  Many of these are ill-defined, while others are slightly nodular in appearance.  The largest somewhat nodular area of ground-glass attenuation and reticulation is in the periphery of the right upper lobe (image 21 of series 4), which appears slightly less bulky than the prior examination 01/07/2012, currently measuring 3.3 x 1.6 cm (previously 3.1 x 1.9 cm).  Other notable lesions include a 9 mm lesion  in the anterior aspect of the right upper lobe (image 25 of series 4), and a faint 1.6 x 1.7 cm lesion in the anterior aspect of the left upper lobe (image 26 of series 4), both of which appear unchanged.  There is an increasing nodular density measuring 6 mm in the posterior aspect of the right upper lobe shortly above the major fissure (image 21 of series 4), which is nonspecific, but favored to represent an area of developing pleural scarring.  Mild diffuse bronchial wall thickening with mild centrilobular and paraseptal emphysema.  Bilateral apical nodular pleural parenchymal scarring is unchanged.  No acute consolidative airspace disease. No pleural effusions.  Upper Abdomen: Extensive atherosclerosis.  Musculoskeletal: Orthopedic fixation hardware in the lower cervical spine incompletely visualized. There are no aggressive appearing lytic or blastic lesions noted in the visualized portions of the skeleton.  A small sclerotic lesion in the inferior aspect of the right scapula is unchanged, most compatible with a bone island.  IMPRESSION: 1.  Overall, the appearance of the chest is very similar to prior examinations with multiple subsolid nodular areas  scattered throughout the lungs bilaterally, as above.  The majority of these are unchanged, while the largest one in the periphery of the right upper lobe appears slightly less bulky than the prior examination. Continued attention on follow-up studies is recommended to exclude progression of disease, as slow-growing adenocarcinoma(s) are not excluded. 2.  Atherosclerosis, including left anterior descending coronary artery disease. Assessment for potential risk factor modification, dietary therapy or pharmacologic therapy may be warranted, if clinically indicated. 3.  Mild diffuse bronchial wall thickening with mild centrilobular and paraseptal emphysema. 4.  Additional incidental findings, as above.   Original Report Authenticated By: Vinnie Langton, M.D.    Ct Chest Wo Contrast  01/07/2012  *RADIOLOGY REPORT*  Clinical Data: Follow-up pulmonary nodules.  History of chronic airway obstruction and aspiration pneumonia.  No history of malignancy.  CT CHEST WITHOUT CONTRAST  Technique:  Multidetector CT imaging of the chest was performed following the standard protocol without IV contrast.  Comparison: Prior chest CTs dated 10/02/2011 and 07/27/2011.  Findings: The mediastinum has a stable appearance with unchanged normal sinus mediastinal lymph nodes.  There is scattered atherosclerosis of the great vessels, aorta and coronary arteries. There is no pleural or pericardial effusion.  Compared with the original study, the airway thickening and patchy peribronchial opacities within the right lower lobe have resolved. These were likely inflammatory.  However, there are several persistent mixed solid and ground-glass densities.  The largest is positioned peripherally in the right upper lobe and measures approximately 3.1 x 1.9 cm on image 19.  A 9 mm mixed ground-glass opacity in the right upper lobe on image 23 has slightly enlarged. A faint pure ground-glass opacity in the right lower lobe on image 39 is stable.   Likewise, there is a stable ground-glass opacity in the left upper lobe on image 21.  Biapical scarring is stable.  The visualized upper abdomen has a stable appearance.  There are multiple calcified lymph nodes within the porta hepatis.  IMPRESSION:  1.  Compared with the most recent examination of 3 months ago, no significant changes are identified.  No acute process is seen. 2.  There are persistent mixed solid and ground-glass opacities in the right upper lobe as well as additional pure ground-glass opacities.  Although possibly postinflammatory, adenocarcinoma cannot be excluded. Thoracic surgical consultation should be considered.  Followup by CT is recommended in 12 months, with  continued annual surveillance for a minimum of 3 years.  These recommendations are taken from "Recommendations for the Management of Subsolid Pulmonary Nodules Detected at CT:  A Statement from the Gross" Radiology 2013; 266:1, 304- 317.   Original Report Authenticated By: Richardean Sale, M.D.     Recent Lab Findings: Lab Results  Component Value Date   WBC 7.6 05/18/2013   HGB 15.4 05/18/2013   HCT 46.2 05/18/2013   PLT 189 05/18/2013   GLUCOSE 101* 05/18/2013   CHOL 160 07/06/2012   TRIG 97 07/06/2012   HDL 48 07/06/2012   LDLCALC 93 07/06/2012   ALT 9 05/18/2013   AST 18 05/18/2013   NA 137 05/18/2013   K 4.2 05/18/2013   CL 97 05/18/2013   CREATININE 0.86 05/18/2013   BUN 9 05/18/2013   CO2 30 05/18/2013   TSH 1.631 07/28/2011   INR 0.86 05/18/2013   HGBA1C 6.0* 07/27/2011   PFT's  FEV1 1.33 40%  DLCO  13.33 38%  Path: both the medial and the lateral lesions of the rt upper lobe have positive cytology: MALIGNANT CELLS CONSISTENT WITH ADENOCARCINOMA. I have discussed with pathology the status of further testing and all cell block was used.  Assessment / Plan:   Patient who because of his underlying pulmonary limitations would be a very poor operative candidate now presents with slowly enlarging dominant right  lung groundglass opacity, with positive cytology for adeno carcinoma in two separate nodules in the right upper lobe (T3)  And other areas of "ground glass opacity ".  Clinical Stage IIb (pT3, cN0,cM0) adenocarcinoma. The findings were discussed with the patient and his wife. He will return to [facility] next week to see Dr Tammi Klippel and Dr Julien Nordmann. Can consider repeat ENB or CT guided bx of the lateral lesion for genetic testing.     [name] MD  Beeper [phone] Office [phone]

## 2013-05-23 ENCOUNTER — Telehealth: Payer: Self-pay | Admitting: *Deleted

## 2013-05-23 NOTE — Telephone Encounter (Signed)
Called spoke with wife regarding appt for [facility] 05/26/13.  She verbalized understanding of appt time and place.

## 2013-05-26 ENCOUNTER — Encounter: Payer: Self-pay | Admitting: Radiation Oncology

## 2013-05-26 ENCOUNTER — Encounter: Payer: Self-pay | Admitting: *Deleted

## 2013-05-26 ENCOUNTER — Encounter: Payer: Self-pay | Admitting: Internal Medicine

## 2013-05-26 ENCOUNTER — Ambulatory Visit
Admission: RE | Admit: 2013-05-26 | Discharge: 2013-05-26 | Disposition: A | Discharge status: Home / Self Care | Payer: Medicare Other | Source: Ambulatory Visit | Attending: Radiation Oncology | Admitting: Radiation Oncology

## 2013-05-26 ENCOUNTER — Telehealth: Payer: Self-pay | Admitting: Internal Medicine

## 2013-05-26 ENCOUNTER — Ambulatory Visit (HOSPITAL_BASED_OUTPATIENT_CLINIC_OR_DEPARTMENT_OTHER): Payer: Medicare Other | Admitting: Internal Medicine

## 2013-05-26 ENCOUNTER — Ambulatory Visit: Payer: Medicare Other | Admitting: Physical Therapy

## 2013-05-26 VITALS — BP 150/51 | HR 78 | Temp 98.3°F | Resp 17 | Ht 73.0 in | Wt 145.1 lb

## 2013-05-26 DIAGNOSIS — C3410 Malignant neoplasm of upper lobe, unspecified bronchus or lung: Secondary | ICD-10-CM

## 2013-05-26 NOTE — Progress Notes (Signed)
[facility] Clinical Social Work  Clinical Social Work met with patient/family and Futures trader at [facility] appointment to offer support and assess for psychosocial needs.  Medical oncologist reviewed patient's diagnosis and recommended treatment plan with patient/family.  The patient receives support from his spouse, children, grandchildren, and his faith.   Clinical Social Work briefly discussed Clinical Social Work role and [employer] support programs/services.  Clinical Social Work encouraged patient to call with any additional questions or concerns.   Polo Riley, MSW, LCSW, OSW-C Clinical Social Worker [facility] [phone]

## 2013-05-26 NOTE — [facility] Oncology Navigator Note (Signed)
Patient at [facility] for [facility].  Patient accompanied by his wife.  Patient seen by Dr. Julien Nordmann and Dr. Tammi Klippel.  I met with patient to follow up to see if there were any questions about his treatment plan.  Patient's wife was able to teach back his plan.  Patient concerned about the possibility of pain during his upcoming needle biopsy.  I reassured him that they would do everything possible to make him comfortable.  Patient and his wife denied any other questions or concerns at this time.

## 2013-05-26 NOTE — Progress Notes (Signed)
Pt states he smokes a e-cigarette.

## 2013-05-26 NOTE — Progress Notes (Signed)
Not seen, await biopsy

## 2013-05-26 NOTE — Progress Notes (Signed)
[place] [phone]   [phone] Multidisciplinary thoracic oncology clinic ([place])   CONSULT NOTE  REFERRING PHYSICIAN: Dr. Lanelle Bal  REASON FOR CONSULTATION:  78 years old white male with recently diagnosed lung cancer.  HPI Adam Ward is a 78 y.o. male with past medical history significant for multiple medical problems including history of hypertension, dyslipidemia, atrial fibrillation, community-acquired pneumonia, anxiety, : Surgery for early colon cancer in 1993 as well as cervical disc surgery and long history of smoking. The patient mentions that in May of 2013 he was diagnosed with pneumonia when he presented with shortness of breath and CT angiogram of the chest at that time showed scattered irregular opacities. He was followed closely by regular CT scan of the chest almost every 6 months. Repeat CT scan of the chest on 05/05/2013 showed Subcentimeter ground-glass opacities in the right lung apex again seen. Slightly more consolidated ill-defined ground-glass nodular density measuring 31 x 22 mm, previously 33 x 23 mm, relatively unchanged. Right upper lobe anterior segment 13 x 15 mm ground-glass opacity was 10 x 12 mm. Minimal linear scarring in the right lung base. 12 x 12 mm ground-glass consolidation in left upper lobe, anterior segment was 16 x 13 mm. The patient was seen by Dr. Madolyn Frieze and on 05/18/2013. He underwent Video bronchoscopy with electromagnetic navigation  bronchoscopy and transbronchial biopsy of two right upper lobe lung  Lesions.  The final pathology of many of these biopsies were consistent with adenocarcinoma, but there was insufficient tissue to be sent for molecular studies.  The patient was referred to me today for evaluation and recommendation regarding treatment of his condition. When seen today he denied having any significant complaints. He denied having any chest pain, shortness breath, cough or  hemoptysis. He lost around 5 pounds in the last few months. He denied having any headache or visual changes. Family history significant for a mother with heart disease and father died from a stroke. The patient is married and has 4 children. He was accompanied by his wife Adam Ward). He has a history of smoking one pack per day for around 55 years and is currently using E. Cigarettes. He has no history of alcohol or drug abuse.   HPI  Past Medical History  Diagnosis Date  . Hypertension   . High cholesterol   . Anxiety   . Cancer 1993    colon  . Rapid atrial fibrillation 07/28/2011    Paroxysmal, newly diagnosed.  . Cavitary lesion of lung 07/28/2011    AFB smears x3 negative.  . Acute respiratory failure with hypoxia 07/26/2011  . Bulla of lung 07/28/2011  . Tobacco abuse 08/01/2011  . COPD (chronic obstructive pulmonary disease)   . Pneumonia   . Numbness and tingling     Hx: of in right hand and B/LLE  . GERD (gastroesophageal reflux disease)     Hx: of  . Headache(784.0)     Past Surgical History  Procedure Laterality Date  . Colon surgery    . Cervical disc surgery    . Tonsillectomy    . Appendectomy    . Eye surgery      as a child  . Video bronchoscopy with endobronchial navigation N/A 05/18/2013    Procedure: VIDEO BRONCHOSCOPY WITH ENDOBRONCHIAL NAVIGATION;  Surgeon: [name], MD;  [facility]: [facility] OR;  Service: Thoracic;  Laterality: N/A;    Family History  Problem Relation Age of Onset  . Heart  disease Mother   . Stroke Father   . Pneumonia Sister   . Heart disease Brother     Social History History  Substance Use Topics  . Smoking status: Former Smoker    Quit date: 07/16/2011  . Smokeless tobacco: Not on file     Comment: started using electric ciggerette in 06-2010  . Alcohol Use: No    Allergies  Allergen Reactions  . Celebrex [Celecoxib] Other (See Comments)    Unknown   . Tetracyclines & Related Hives    Current Outpatient  Prescriptions  Medication Sig Dispense Refill  . alprazolam (XANAX) 2 MG tablet Take 2 mg by mouth 3 (three) times daily.      . digoxin (LANOXIN) 0.25 MG tablet Take 0.25 mg by mouth daily.      Marland Kitchen diltiazem (CARDIZEM CD) 240 MG 24 hr capsule Take 240 mg by mouth daily.      . Multiple Vitamins-Minerals (MULTIVITAMIN WITH MINERALS) tablet Take 1 tablet by mouth daily.      Marland Kitchen PROAIR HFA 108 (90 BASE) MCG/ACT inhaler Inhale 1 puff into the lungs every 4 (four) hours as needed for wheezing or shortness of breath.       . Saw Palmetto, Serenoa repens, (SAW PALMETTO PO) Take 1 tablet by mouth 2 (two) times daily.      . simvastatin (ZOCOR) 20 MG tablet Take 20 mg by mouth daily.      Marland Kitchen aspirin EC 81 MG tablet Take 81 mg by mouth daily.      . Omega-3 Fatty Acids (FISH OIL) 1000 MG CAPS Take 1 capsule by mouth daily.       No current facility-administered medications for this visit.    Review of Systems  Constitutional: positive for fatigue Eyes: negative Ears, nose, mouth, throat, and face: negative Respiratory: negative Cardiovascular: negative Gastrointestinal: negative Genitourinary:negative Integument/breast: negative Hematologic/lymphatic: negative Musculoskeletal:negative Neurological: negative Behavioral/Psych: negative Endocrine: negative Allergic/Immunologic: negative  Physical Exam  CBJ:SEGBT, healthy, no distress, well nourished and well developed SKIN: skin color, texture, turgor are normal, no rashes or significant lesions HEAD: Normocephalic, No masses, lesions, tenderness or abnormalities EYES: normal, PERRLA EARS: External ears normal, Canals clear OROPHARYNX:no exudate and no erythema  NECK: supple, no adenopathy, no JVD LYMPH:  no palpable lymphadenopathy, no hepatosplenomegaly LUNGS: clear to auscultation , and palpation HEART: regular rate & rhythm, no murmurs and no gallops ABDOMEN:abdomen soft, non-tender, normal bowel sounds and no masses or  organomegaly BACK: Back symmetric, no curvature., No CVA tenderness EXTREMITIES:no joint deformities, effusion, or inflammation, no edema, no skin discoloration, no clubbing  NEURO: alert & oriented x 3 with fluent speech, no focal motor/sensory deficits  PERFORMANCE STATUS: ECOG 2  LABORATORY DATA: Lab Results  Component Value Date   WBC 7.6 05/18/2013   HGB 15.4 05/18/2013   HCT 46.2 05/18/2013   MCV 92.2 05/18/2013   PLT 189 05/18/2013      Chemistry      Component Value Date/Time   NA 137 05/18/2013 0919   K 4.2 05/18/2013 0919   CL 97 05/18/2013 0919   CO2 30 05/18/2013 0919   BUN 9 05/18/2013 0919   CREATININE 0.86 05/18/2013 0919   CREATININE 0.88 01/04/2013 1627      Component Value Date/Time   CALCIUM 9.2 05/18/2013 0919   ALKPHOS 96 05/18/2013 0919   AST 18 05/18/2013 0919   ALT 9 05/18/2013 0919   BILITOT 0.4 05/18/2013 0919       RADIOGRAPHIC STUDIES: Dg  Chest 2 View Within Previous 72 Hours.  Films Obtained On Friday Are Acceptable For Monday And Tuesday Cases  05/18/2013   CLINICAL DATA:  Bronchoscopy.  Neoplasm uncertain behavior of Lung  EXAM: CHEST  2 VIEW  COMPARISON:  CT chest 05/05/2013  FINDINGS: COPD with hyperinflation of the lungs. Heart size is normal and the vascularity is normal.  Focal right upper lobe airspace disease is similar to the prior CT. This could represent scar or neoplasm.  IMPRESSION: COPD.  Right upper lobe density unchanged from recent CT. This could represent scar or neoplasm.   Electronically Signed   By: Franchot Gallo M.D.   On: 05/18/2013 09:47   Ct Chest Wo Contrast  05/05/2013   CLINICAL DATA:  Follow-up lung nodule. History of colon cancer 1993.  EXAM: CT CHEST WITHOUT CONTRAST  TECHNIQUE: Multidetector CT imaging of the chest was performed following the standard protocol without IV contrast.  COMPARISON:  CT CHEST W/O CM dated 11/25/2012  FINDINGS: Subcentimeter ground-glass opacities in the right lung apex again seen. Slightly more  consolidated ill-defined ground-glass nodular density measuring 31 x 22 mm, previously 33 x 23 mm, relatively unchanged. Right upper lobe anterior segment 13 x 15 mm ground-glass opacity was 10 x 12 mm. Minimal linear scarring in the right lung base. 12 x 12 mm ground-glass consolidation in left upper lobe, anterior segment was 16 x 13 mm.  Moderate centrilobular emphysema. Trace left apical calcified pleural plaque with underlying nodular scarring. No pleural effusions or focal consolidations. Increased lung volumes. Left perihilar air cyst/bleb. No pneumothorax.  Heart size is normal, pericardium is nonsuspicious. Thoracic aorta is normal course and caliber with moderate calcific atherosclerosis. 8 mm short axis precarinal lymph node is similar, 7 mm aortopulmonary window lymph node is unchanged. Thoracic esophagus is unremarkable. Included view of the abdomen is nonsuspicious.  Scattered thoracic small resonance. Soft tissues are nonsuspicious. Partially imaged ACDF.  IMPRESSION: Multiple sub solid pulmonary nodules again seen, with increased size of a right upper lobe sub solid pulmonary nodule (previously 10 x 12 mm, now 13 x 15 mm). Considering interval growth, as previously reported prominent an indolent neoplasm such as low-grade adenocarcinoma may have this appearance. However, slightly decreased size of left upper lobe sub solid nodule.  Moderate centrilobular emphysema.  No acute cardiopulmonary process.   Electronically Signed   By: Elon Alas   On: 05/05/2013 14:27   Dg Chest Port 1 View  05/18/2013   CLINICAL DATA:  Post bronchoscopy  EXAM: PORTABLE CHEST - 1 VIEW  COMPARISON:  05/18/2013  FINDINGS: COPD. Right upper lobe density unchanged. No pneumothorax. Negative for heart failure or effusion.  IMPRESSION: Right upper lobe density unchanged. No acute complication following bronchoscopy.   Electronically Signed   By: Franchot Gallo M.D.   On: 05/18/2013 16:03   Dg C-arm  Bronchoscopy  05/18/2013   CLINICAL DATA: INTRAOPERATIVE enb   C-ARM BRONCHOSCOPY  Fluoroscopy was utilized by the requesting physician.  No radiographic  interpretation.     ASSESSMENT: This is a very pleasant 78 years old white male recently diagnosed with non-small cell lung cancer, adenocarcinoma suspicious for stage IV versus synchronous early stage disease involving the left upper lobe stage IA (T1b, N0, M0) as well as the right upper lobe IIB (T3, N0, M0).   PLAN: I had a lengthy discussion with the patient and his wife today about his current disease stage, prognosis and treatment options. Unfortunately there is insufficient tissue for molecular testing.  I discussed with the patient proceeding with CT guided core biopsy of the right upper lobe nodule to have enough tissue for molecular biomarker studies. If the patient has positive EGFR mutation or ALK gene translocation, he can be treated with oral biologic targeted agent but if he has negative medications, the patient would be considered for systemic chemotherapy if symptomatic or observation if he continues to be asymptomatic. He may also benefit from stereotactic radiotherapy to the most prominent pulmonary lesion. The patient was seen by Dr. Tammi Klippel for evaluation and discussion of this option. I would arrange for the patient to have the CT guided core biopsy performed by interventional radiology. I would see him back for follow up visit in 3 weeks for evaluation and discussion of his treatment options based on the molecular studies. He was advised to call immediately if he has any concerning symptoms in the interval.  The patient was seen today at the multidisciplinary thoracic oncology clinic by medical oncology, thoracic navigator, social worker as well as physical therapist. The patient voices understanding of current disease status and treatment options and is in agreement with the current care plan.  All questions were answered.  The patient knows to call the clinic with any problems, questions or concerns. We can certainly see the patient much sooner if necessary.  Thank you so much for allowing me to participate in the care of [place]. I will continue to follow up the patient with you and assist in his care.  I spent 40 minutes counseling the patient face to face. The total time spent in the appointment was 60 minutes.  Disclaimer: This note was dictated with voice recognition software. Similar sounding words can inadvertently be transcribed and may not be corrected upon review.   Alima Naser K. 05/26/2013, 4:40 PM

## 2013-05-26 NOTE — Telephone Encounter (Signed)
gv pt appt schedule for april. [facility] will call w/bx and mri appt - pt aware. mri to be done @ [facility].

## 2013-05-31 ENCOUNTER — Other Ambulatory Visit: Payer: Self-pay | Admitting: Radiology

## 2013-05-31 ENCOUNTER — Encounter (HOSPITAL_COMMUNITY): Payer: Self-pay | Admitting: Pharmacy Technician

## 2013-05-31 ENCOUNTER — Encounter: Payer: Self-pay | Admitting: *Deleted

## 2013-05-31 NOTE — [facility] Oncology Navigator Note (Signed)
I called patient and spoke with him and his wife to remind him of his biopsy tomorrow.  Patient had received instructions from [facility].  Patient concerned about pain during the procedure.  We discussed that he would be sedated for the procedure.  We also reviewed his upcoming appointments.  Patient and his wife verbalized understanding and denied any other questions.  I encouraged them to call me for any needs.

## 2013-06-01 ENCOUNTER — Ambulatory Visit (HOSPITAL_COMMUNITY)
Admission: RE | Admit: 2013-06-01 | Discharge: 2013-06-01 | Disposition: A | Discharge status: Home / Self Care | Payer: Medicare Other | Source: Ambulatory Visit | Attending: Interventional Radiology | Admitting: Interventional Radiology

## 2013-06-01 ENCOUNTER — Ambulatory Visit (HOSPITAL_COMMUNITY)
Admission: RE | Admit: 2013-06-01 | Discharge: 2013-06-01 | Disposition: A | Discharge status: Home / Self Care | Payer: Medicare Other | Source: Ambulatory Visit | Attending: Internal Medicine | Admitting: Internal Medicine

## 2013-06-01 DIAGNOSIS — R911 Solitary pulmonary nodule: Secondary | ICD-10-CM | POA: Diagnosis not present

## 2013-06-01 DIAGNOSIS — C3410 Malignant neoplasm of upper lobe, unspecified bronchus or lung: Secondary | ICD-10-CM | POA: Diagnosis not present

## 2013-06-01 DIAGNOSIS — J449 Chronic obstructive pulmonary disease, unspecified: Secondary | ICD-10-CM | POA: Diagnosis not present

## 2013-06-01 LAB — CBC
HEMATOCRIT: 42.4 % (ref 39.0–52.0)
HEMOGLOBIN: 14.2 g/dL (ref 13.0–17.0)
MCH: 30.7 pg (ref 26.0–34.0)
MCHC: 33.5 g/dL (ref 30.0–36.0)
MCV: 91.8 fL (ref 78.0–100.0)
Platelets: 209 10*3/uL (ref 150–400)
RBC: 4.62 MIL/uL (ref 4.22–5.81)
RDW: 12.6 % (ref 11.5–15.5)
WBC: 7.1 10*3/uL (ref 4.0–10.5)

## 2013-06-01 LAB — APTT: APTT: 26 s (ref 24–37)

## 2013-06-01 LAB — PROTIME-INR
INR: 0.92 (ref 0.00–1.49)
PROTHROMBIN TIME: 12.2 s (ref 11.6–15.2)

## 2013-06-01 MED ORDER — MIDAZOLAM HCL 2 MG/2ML IJ SOLN
INTRAMUSCULAR | Status: AC | PRN
  Administered 2013-06-01 (×2): 1 mg via INTRAVENOUS

## 2013-06-01 MED ORDER — SODIUM CHLORIDE 0.9 % IV SOLN: INTRAVENOUS | Status: DC

## 2013-06-01 MED ORDER — LIDOCAINE HCL 1 % IJ SOLN
INTRAMUSCULAR | Status: AC
  Filled 2013-06-01: qty 10

## 2013-06-01 MED ORDER — MIDAZOLAM HCL 2 MG/2ML IJ SOLN
INTRAMUSCULAR | Status: AC
  Filled 2013-06-01: qty 4

## 2013-06-01 MED ORDER — FENTANYL CITRATE 0.05 MG/ML IJ SOLN
INTRAMUSCULAR | Status: AC
  Filled 2013-06-01: qty 4

## 2013-06-01 MED ORDER — FENTANYL CITRATE 0.05 MG/ML IJ SOLN
INTRAMUSCULAR | Status: AC | PRN
  Administered 2013-06-01 (×2): 50 ug via INTRAVENOUS

## 2013-06-01 NOTE — Discharge Instructions (Signed)
Needle Biopsy of Lung, Care After Refer to this sheet in the next few weeks. These instructions provide you with information on caring for yourself after your procedure. Your health care provider may also give you more specific instructions. Your treatment has been planned according to current medical practices, but problems sometimes occur. Call your health care provider if you have any problems or questions after your procedure. WHAT TO EXPECT AFTER THE PROCEDURE A bandage will be applied over the areas where the needle was inserted. You may be asked to apply pressure to the bandage for several minutes to ensure there is minimal bleeding. In most cases, you can leave when your needle biopsy procedure is completed. Do not drive yourself home. Someone else should take you home. If you received an IV sedative or general anesthetic, you will be taken to a comfortable place to relax while the medication wears off. If you have upcoming travel scheduled, talk to your doctor about when it is safe to travel by air after the procedure. HOME CARE INSTRUCTIONS Expect to take it easy for the rest of the day. Protect the area where you received the needle biopsy by keeping the bandage in place for as long as instructed. You may feel some mild pain or discomfort in the area, but this should stop in a day or two. Only take over-the-counter or prescription medicines for pain, discomfort, or fever as directed by your caregiver. SEEK MEDICAL CARE IF:   You have pain at the biopsy site that worsens or is not helped by medication.  You have swelling or drainage at the needle biopsy site.  You have a fever. SEEK IMMEDIATE MEDICAL CARE IF:   You have new or worsening shortness of breath.  You have chest pain.  You are coughing up blood.  You have bleeding that does not stop with pressure or a bandage.  You develop light-headedness or fainting. Document Released: 12/15/2006 Document Revised: 10/20/2012 Document  Reviewed: 07/12/2012 [facility] Patient Information 2014 [place].

## 2013-06-01 NOTE — Sedation Documentation (Signed)
O2 d/c'd 

## 2013-06-01 NOTE — Sedation Documentation (Signed)
MD at bedside.  Dr Barbie Banner reviewing procedure and risks with pt and wife.

## 2013-06-01 NOTE — Sedation Documentation (Signed)
MD at bedside. 

## 2013-06-01 NOTE — Procedures (Signed)
RUL lung Bx No comp

## 2013-06-01 NOTE — Progress Notes (Signed)
CXR results reviewed per Rowe Robert, PA.  He states that pt may be discharged at appropriate scheduled time.

## 2013-06-01 NOTE — H&P (Signed)
Adam Ward is an 78 y.o. male.   Chief Complaint: lung cancer HPI: Patient with history of recently diagnosed [place] lung carcinoma via endobrochial biopsy presents today for CT guided core biopsy of a RUL mass for additional genetic testing for [facility].  Past Medical History  Diagnosis Date  . Hypertension   . High cholesterol   . Anxiety   . Cancer 1993    colon  . Rapid atrial fibrillation 07/28/2011    Paroxysmal, newly diagnosed.  . Cavitary lesion of lung 07/28/2011    AFB smears x3 negative.  . Acute respiratory failure with hypoxia 07/26/2011  . Bulla of lung 07/28/2011  . Tobacco abuse 08/01/2011  . COPD (chronic obstructive pulmonary disease)   . Pneumonia   . Numbness and tingling     Hx: of in right hand and B/LLE  . GERD (gastroesophageal reflux disease)     Hx: of  . Headache(784.0)     Past Surgical History  Procedure Laterality Date  . Colon surgery    . Cervical disc surgery    . Tonsillectomy    . Appendectomy    . Eye surgery      as a child  . Video bronchoscopy with endobronchial navigation N/A 05/18/2013    Procedure: VIDEO BRONCHOSCOPY WITH ENDOBRONCHIAL NAVIGATION;  Surgeon: [name], MD;  Location: [facility] OR;  Service: Thoracic;  Laterality: N/A;    Family History  Problem Relation Age of Onset  . Heart disease Mother   . Stroke Father   . Pneumonia Sister   . Heart disease Brother    Social History:  reports that he quit smoking about 22 months ago. He does not have any smokeless tobacco history on file. He reports that he does not drink alcohol or use illicit drugs.  Allergies:  Allergies  Allergen Reactions  . Celebrex [Celecoxib] Other (See Comments)    Bothers the patients eyes.  . Tetracyclines & Related Other (See Comments)    Breaks out in mouth.    Current outpatient prescriptions:alprazolam (XANAX) 2 MG tablet, Take 2 mg by mouth 3 (three) times daily., Disp: , Rfl: ;  aspirin EC 81 MG tablet, Take 81 mg by mouth daily.,  Disp: , Rfl: ;  digoxin (LANOXIN) 0.25 MG tablet, Take 0.25 mg by mouth daily., Disp: , Rfl: ;  diltiazem (CARDIZEM CD) 240 MG 24 hr capsule, Take 240 mg by mouth daily., Disp: , Rfl: ;  docusate sodium (COLACE) 100 MG capsule, Take 100-200 mg by mouth daily. , Disp: , Rfl:  Multiple Vitamins-Minerals (MULTIVITAMIN WITH MINERALS) tablet, Take 1 tablet by mouth daily., Disp: , Rfl: ;  Omega-3 Fatty Acids (FISH OIL) 1000 MG CAPS, Take 1 capsule by mouth daily., Disp: , Rfl: ;  PROAIR HFA 108 (90 BASE) MCG/ACT inhaler, Inhale 1 puff into the lungs every 4 (four) hours as needed for wheezing or shortness of breath. , Disp: , Rfl:  Saw Palmetto, Serenoa repens, (SAW PALMETTO PO), Take 2 tablets by mouth daily. , Disp: , Rfl: ;  simvastatin (ZOCOR) 20 MG tablet, Take 20 mg by mouth daily., Disp: , Rfl:  Current facility-administered medications:0.9 %  sodium chloride infusion, , Intravenous, Continuous, Ascencion Dike, PA-C   Results for orders placed during the hospital encounter of 05/18/13  APTT      Result Value Ref Range   aPTT 27  24 - 37 seconds  CBC      Result Value Ref Range   WBC 7.6  4.0 - 10.5 K/uL   RBC 5.01  4.22 - 5.81 MIL/uL   Hemoglobin 15.4  13.0 - 17.0 g/dL   HCT 46.2  39.0 - 52.0 %   MCV 92.2  78.0 - 100.0 fL   MCH 30.7  26.0 - 34.0 pg   MCHC 33.3  30.0 - 36.0 g/dL   RDW 12.6  11.5 - 15.5 %   Platelets 189  150 - 400 K/uL  COMPREHENSIVE METABOLIC PANEL      Result Value Ref Range   Sodium 137  137 - 147 mEq/L   Potassium 4.2  3.7 - 5.3 mEq/L   Chloride 97  96 - 112 mEq/L   CO2 30  19 - 32 mEq/L   Glucose, Bld 101 (*) 70 - 99 mg/dL   BUN 9  6 - 23 mg/dL   Creatinine, Ser 0.86  0.50 - 1.35 mg/dL   Calcium 9.2  8.4 - 10.5 mg/dL   Total Protein 7.2  6.0 - 8.3 g/dL   Albumin 3.8  3.5 - 5.2 g/dL   AST 18  0 - 37 U/L   ALT 9  0 - 53 U/L   Alkaline Phosphatase 96  39 - 117 U/L   Total Bilirubin 0.4  0.3 - 1.2 mg/dL   GFR calc non Af Amer 83 (*) >90 mL/min   GFR calc Af  Amer >90  >90 mL/min  PROTIME-INR      Result Value Ref Range   Prothrombin Time 11.6  11.6 - 15.2 seconds   INR 0.86  0.00 - 1.49  06/01/13 labs pending  Review of Systems  Constitutional: Positive for malaise/fatigue. Negative for fever and chills.  Respiratory: Positive for cough. Negative for hemoptysis and shortness of breath.   Cardiovascular: Negative for chest pain.  Gastrointestinal: Negative for nausea, vomiting and abdominal pain.  Musculoskeletal: Negative for back pain.  Neurological: Negative for headaches.    Blood pressure 179/1, pulse 64, temperature 98.2 F (36.8 C), temperature source Oral, resp. rate 18, SpO2 97.00%. Physical Exam  Constitutional: He is oriented to person, place, and time.  Thin elderly WM in NAD  Cardiovascular: Normal rate and regular rhythm.   Respiratory: Effort normal.  Distant BS throughout  GI: Soft. Bowel sounds are normal. There is no tenderness.  Musculoskeletal: Normal range of motion.  Trace LLE pretibial edema  Neurological: He is alert and oriented to person, place, and time.     Assessment/Plan Patient with history of recently diagnosed [place] lung carcinoma via endobrochial biopsy presents today for CT guided core biopsy of a RUL mass for additional genetic testing for [facility]. Details/risks of procedure d/w pt/wife with their understanding and consent.   Valaree Fresquez,D KEVIN 06/01/2013, 10:19 AM

## 2013-06-01 NOTE — Sedation Documentation (Signed)
O2 started 2l/[place]

## 2013-06-02 ENCOUNTER — Ambulatory Visit (HOSPITAL_COMMUNITY)
Admission: RE | Admit: 2013-06-02 | Discharge: 2013-06-02 | Disposition: A | Discharge status: Home / Self Care | Payer: Medicare Other | Source: Ambulatory Visit | Attending: Internal Medicine | Admitting: Internal Medicine

## 2013-06-02 DIAGNOSIS — G319 Degenerative disease of nervous system, unspecified: Secondary | ICD-10-CM | POA: Diagnosis not present

## 2013-06-02 DIAGNOSIS — C3490 Malignant neoplasm of unspecified part of unspecified bronchus or lung: Secondary | ICD-10-CM | POA: Diagnosis not present

## 2013-06-02 DIAGNOSIS — Z8673 Personal history of transient ischemic attack (TIA), and cerebral infarction without residual deficits: Secondary | ICD-10-CM | POA: Insufficient documentation

## 2013-06-02 DIAGNOSIS — G930 Cerebral cysts: Secondary | ICD-10-CM | POA: Insufficient documentation

## 2013-06-02 DIAGNOSIS — C3410 Malignant neoplasm of upper lobe, unspecified bronchus or lung: Secondary | ICD-10-CM

## 2013-06-02 MED ORDER — GADOBENATE DIMEGLUMINE 529 MG/ML IV SOLN
13.0000 mL | Freq: Once | INTRAVENOUS | Status: AC | PRN
  Administered 2013-06-02: 13 mL via INTRAVENOUS

## 2013-06-13 ENCOUNTER — Encounter: Payer: Self-pay | Admitting: *Deleted

## 2013-06-13 NOTE — Progress Notes (Signed)
[facility] faxed me incomplete request form sent 06/09/13.  I completed a new form and sent to [facility] de[facility].

## 2013-06-15 ENCOUNTER — Telehealth: Payer: Self-pay | Admitting: Internal Medicine

## 2013-06-15 ENCOUNTER — Encounter: Payer: Self-pay | Admitting: Internal Medicine

## 2013-06-15 ENCOUNTER — Ambulatory Visit (HOSPITAL_BASED_OUTPATIENT_CLINIC_OR_DEPARTMENT_OTHER): Payer: Medicare Other

## 2013-06-15 ENCOUNTER — Ambulatory Visit (HOSPITAL_BASED_OUTPATIENT_CLINIC_OR_DEPARTMENT_OTHER): Payer: Medicare Other | Admitting: Internal Medicine

## 2013-06-15 VITALS — BP 153/65 | HR 78 | Temp 98.0°F | Resp 17 | Ht 73.0 in | Wt 146.5 lb

## 2013-06-15 DIAGNOSIS — C3410 Malignant neoplasm of upper lobe, unspecified bronchus or lung: Secondary | ICD-10-CM

## 2013-06-15 DIAGNOSIS — Z95828 Presence of other vascular implants and grafts: Secondary | ICD-10-CM

## 2013-06-15 LAB — CBC WITH DIFFERENTIAL/PLATELET
BASO%: 0.7 % (ref 0.0–2.0)
Basophils Absolute: 0 10e3/uL (ref 0.0–0.1)
EOS%: 4.1 % (ref 0.0–7.0)
Eosinophils Absolute: 0.2 10e3/uL (ref 0.0–0.5)
HCT: 45.1 % (ref 38.4–49.9)
HGB: 14.7 g/dL (ref 13.0–17.1)
LYMPH%: 29.2 % (ref 14.0–49.0)
MCH: 30.1 pg (ref 27.2–33.4)
MCHC: 32.6 g/dL (ref 32.0–36.0)
MCV: 92.5 fL (ref 79.3–98.0)
MONO#: 0.5 10e3/uL (ref 0.1–0.9)
MONO%: 8.9 % (ref 0.0–14.0)
NEUT#: 3.4 10e3/uL (ref 1.5–6.5)
NEUT%: 57.1 % (ref 39.0–75.0)
Platelets: 206 10e3/uL (ref 140–400)
RBC: 4.88 10e6/uL (ref 4.20–5.82)
RDW: 12.6 % (ref 11.0–14.6)
WBC: 6 10e3/uL (ref 4.0–10.3)
lymph#: 1.8 10e3/uL (ref 0.9–3.3)

## 2013-06-15 LAB — COMPREHENSIVE METABOLIC PANEL (CC13)
ALT: 7 U/L (ref 0–55)
AST: 16 U/L (ref 5–34)
Albumin: 3.7 g/dL (ref 3.5–5.0)
Alkaline Phosphatase: 106 U/L (ref 40–150)
Anion Gap: 7 mEq/L (ref 3–11)
BUN: 10.1 mg/dL (ref 7.0–26.0)
CHLORIDE: 101 meq/L (ref 98–109)
CO2: 33 meq/L — AB (ref 22–29)
CREATININE: 0.9 mg/dL (ref 0.7–1.3)
Calcium: 9.2 mg/dL (ref 8.4–10.4)
Glucose: 88 mg/dl (ref 70–140)
Potassium: 4.2 mEq/L (ref 3.5–5.1)
Sodium: 140 mEq/L (ref 136–145)
TOTAL PROTEIN: 6.8 g/dL (ref 6.4–8.3)
Total Bilirubin: 0.5 mg/dL (ref 0.20–1.20)

## 2013-06-15 MED ORDER — HEPARIN SOD (PORK) LOCK FLUSH 100 UNIT/ML IV SOLN
500.0000 [IU] | Freq: Once | INTRAVENOUS | Status: DC
  Filled 2013-06-15: qty 5

## 2013-06-15 MED ORDER — SODIUM CHLORIDE 0.9 % IJ SOLN
10.0000 mL | INTRAMUSCULAR | Status: DC | PRN
  Filled 2013-06-15: qty 10

## 2013-06-15 NOTE — Telephone Encounter (Signed)
Gave pt appt for Adam Ward amd MD for May and see Dr Tammi Klippel on April 2015

## 2013-06-15 NOTE — Progress Notes (Signed)
[place] [phone]   [phone]  OFFICE PROGRESS NOTE  Purvis Kilts, MD [address] [place] [address]  DIAGNOSIS: Unresectable he stage IIB (T3, N0, M0) non-small cell lung cancer, adenocarcinoma diagnosed in March of 2015.  PRIOR THERAPY: None  CURRENT THERAPY: None but the patient is expected to undergo stereotactic radiotherapy to the right upper lobe pulmonary nodules under the care of Dr. Tammi Klippel.  INTERVAL HISTORY: Adam Ward 78 y.o. male returns to the clinic today for followup visit accompanied by his wife. The patient is feeling fine today with no specific complaints except for increasing fatigue and weakness. He denied having any significant chest pain but has shortness breath with exertion with mild cough with no hemoptysis. The patient denied having any significant weight loss or night sweats. He denied having any fever or chills, no nausea or vomiting. He had MRI of the brain performed recently as well as CT-guided biopsy of the right upper lobe lung mass for molecular studies. He is here today for evaluation and discussion of his treatment options.  MEDICAL HISTORY: Past Medical History  Diagnosis Date  . Hypertension   . High cholesterol   . Anxiety   . Cancer 1993    colon  . Rapid atrial fibrillation 07/28/2011    Paroxysmal, newly diagnosed.  . Cavitary lesion of lung 07/28/2011    AFB smears x3 negative.  . Acute respiratory failure with hypoxia 07/26/2011  . [place] of lung 07/28/2011  . Tobacco abuse 08/01/2011  . COPD (chronic obstructive pulmonary disease)   . Pneumonia   . Numbness and tingling     Hx: of in right hand and B/LLE  . GERD (gastroesophageal reflux disease)     Hx: of  . Headache(784.0)     ALLERGIES:  is allergic to celebrex and tetracyclines & related.  MEDICATIONS:  Current Outpatient Prescriptions  Medication Sig Dispense Refill  . alprazolam (XANAX) 2  MG tablet Take 2 mg by mouth 3 (three) times daily.      Marland Kitchen aspirin EC 81 MG tablet Take 81 mg by mouth daily.      . digoxin (LANOXIN) 0.25 MG tablet Take 0.25 mg by mouth daily.      Marland Kitchen diltiazem (CARDIZEM CD) 240 MG 24 hr capsule Take 240 mg by mouth daily.      Marland Kitchen docusate sodium (COLACE) 100 MG capsule Take 100-200 mg by mouth daily.       . Multiple Vitamins-Minerals (MULTIVITAMIN WITH MINERALS) tablet Take 1 tablet by mouth daily.      . Omega-3 Fatty Acids (FISH OIL) 1000 MG CAPS Take 1 capsule by mouth daily.      Marland Kitchen PROAIR HFA 108 (90 BASE) MCG/ACT inhaler Inhale 1 puff into the lungs every 4 (four) hours as needed for wheezing or shortness of breath.       . Saw Palmetto, Serenoa repens, (SAW PALMETTO PO) Take 2 tablets by mouth daily.       . simvastatin (ZOCOR) 20 MG tablet Take 20 mg by mouth daily.       No current facility-administered medications for this visit.   Facility-Administered Medications Ordered in Other Visits  Medication Dose Route Frequency Provider Last Rate Last Dose  . heparin lock flush 100 unit/mL  500 Units Intravenous Once Curt Bears, MD      . sodium chloride 0.9 % injection 10 mL  10 mL Intravenous PRN  Curt Bears, MD        SURGICAL HISTORY:  Past Surgical History  Procedure Laterality Date  . Colon surgery    . Cervical disc surgery    . Tonsillectomy    . Appendectomy    . Eye surgery      as a child  . Video bronchoscopy with endobronchial navigation N/A 05/18/2013    Procedure: VIDEO BRONCHOSCOPY WITH ENDOBRONCHIAL NAVIGATION;  Surgeon: [name], MD;  Location: [facility] OR;  Service: Thoracic;  Laterality: N/A;    REVIEW OF SYSTEMS:  Constitutional: positive for fatigue Eyes: negative Ears, nose, mouth, throat, and face: negative Respiratory: positive for dyspnea on exertion Cardiovascular: negative Gastrointestinal: negative Genitourinary:negative Integument/breast: negative Hematologic/lymphatic:  negative Musculoskeletal:negative Neurological: negative Behavioral/Psych: negative Endocrine: negative Allergic/Immunologic: negative   PHYSICAL EXAMINATION: General appearance: alert, cooperative and no distress Head: Normocephalic, without obvious abnormality, atraumatic Neck: no adenopathy, no JVD, supple, symmetrical, trachea midline and thyroid not enlarged, symmetric, no tenderness/mass/nodules Lymph nodes: Cervical, supraclavicular, and axillary nodes normal. Resp: clear to auscultation bilaterally Back: symmetric, no curvature. ROM normal. No CVA tenderness. Cardio: regular rate and rhythm, S1, S2 normal, no murmur, click, rub or gallop GI: soft, non-tender; bowel sounds normal; no masses,  no organomegaly Extremities: extremities normal, atraumatic, no cyanosis or edema Neurologic: Alert and oriented X 3, normal strength and tone. Normal symmetric reflexes. Normal coordination and gait  ECOG PERFORMANCE STATUS: 1 - Symptomatic but completely ambulatory  Blood pressure 153/65, pulse 78, temperature 98 F (36.7 C), temperature source Oral, resp. rate 17, height _0  (1.854 m), weight 146 lb 8 oz (66.452 kg), SpO2 94.00%.  LABORATORY DATA: Lab Results  Component Value Date   WBC 6.0 06/15/2013   HGB 14.7 06/15/2013   HCT 45.1 06/15/2013   MCV 92.5 06/15/2013   PLT 206 06/15/2013      Chemistry      Component Value Date/Time   NA 137 05/18/2013 0919   K 4.2 05/18/2013 0919   CL 97 05/18/2013 0919   CO2 30 05/18/2013 0919   BUN 9 05/18/2013 0919   CREATININE 0.86 05/18/2013 0919   CREATININE 0.88 01/04/2013 1627      Component Value Date/Time   CALCIUM 9.2 05/18/2013 0919   ALKPHOS 96 05/18/2013 0919   AST 18 05/18/2013 0919   ALT 9 05/18/2013 0919   BILITOT 0.4 05/18/2013 0919       RADIOGRAPHIC STUDIES: Dg Chest 1 View  06/01/2013   CLINICAL DATA:  Right upper lobe nodule. Status post percutaneous needle biopsy.  EXAM: CHEST - 1 VIEW  COMPARISON:  Chest x-ray dated  05/18/2013  FINDINGS: There is an area of pulmonary parenchymal hemorrhage in the right upper lobe after a needle biopsy. No pneumothorax. Lungs are hyperinflated consistent with COPD. Heart size and vascularity are normal.  IMPRESSION: No pneumothorax. Pulmonary parenchymal hemorrhage in the right upper lobe after percutaneous needle biopsy.   Electronically Signed   By: Rozetta Nunnery M.D.   On: 06/01/2013 14:21   Dg Chest 2 View Within Previous 72 Hours.  Films Obtained On Friday Are Acceptable For Monday And Tuesday Cases  05/18/2013   CLINICAL DATA:  Bronchoscopy.  Neoplasm uncertain behavior of Lung  EXAM: CHEST  2 VIEW  COMPARISON:  CT chest 05/05/2013  FINDINGS: COPD with hyperinflation of the lungs. Heart size is normal and the vascularity is normal.  Focal right upper lobe airspace disease is similar to the prior CT. This could represent scar or neoplasm.  IMPRESSION: COPD.  Right upper lobe density unchanged from recent CT. This could represent scar or neoplasm.   Electronically Signed   By: Franchot Gallo M.D.   On: 05/18/2013 09:47   Mr Jeri Cos EX Contrast  06/02/2013   CLINICAL DATA:  Lung cancer staging.  EXAM: MRI HEAD WITHOUT AND WITH CONTRAST  TECHNIQUE: Multiplanar, multiecho pulse sequences of the brain and surrounding structures were obtained without and with intravenous contrast.  CONTRAST:  85mL MULTIHANCE GADOBENATE DIMEGLUMINE 529 MG/ML IV SOLN  COMPARISON:  None.  FINDINGS: There is no acute infarct or intracranial hemorrhage. Small, scattered foci of T2 hyperintensity are present in the subcortical and deep cerebral white matter bilaterally, nonspecific but compatible with minimal chronic small vessel ischemic disease. Age-related cerebral atrophy is present. Remote left external capsule lacunar infarct is noted. A small pineal region cyst measures 8 mm. There is no abnormal enhancement.  Prior bilateral cataract surgery is noted. Paranasal sinuses and mastoid air cells are clear. Major  intracranial vascular flow voids are preserved. Prior ACDF is partially visualized in the upper cervical spine.  IMPRESSION: 1. No evidence of intracranial metastatic disease. 2. Minimal chronic small vessel ischemic disease.   Electronically Signed   By: Logan Bores   On: 06/02/2013 20:54   Ct Biopsy  06/02/2013   CLINICAL DATA:  Right upper lobe nodule  EXAM: CT-GUIDED BIOPSY OF A RIGHT UPPER LOBE NODULE.  CORE.  MEDICATIONS AND MEDICAL HISTORY: Versed two mg, Fentanyl 100 mcg.  Additional Medications: 9.  ANESTHESIA/SEDATION: Moderate sedation time: 10 minutes  PROCEDURE: The procedure, risks, benefits, and alternatives were explained to the patient. Questions regarding the procedure were encouraged and answered. The patient understands and consents to the procedure.  The right axilla was prepped with Betadine in a sterile fashion, and a sterile drape was applied covering the operative field. A sterile gown and sterile gloves were used for the procedure.  Under CT guidance, a(n) 17 gauge guide needle was advanced into the right upper lobe nodule. Subsequently four 18 gauge core biopsies were obtained. The guide needle was removed. Final imaging was performed.  Patient tolerated the procedure well without complication. Vital sign monitoring by nursing staff during the procedure will continue as patient is in the special procedures unit for post procedure observation.  FINDINGS: The images document guide needle placement within the right upper lobe nodule. Post biopsy images demonstrate no evidence of hemorrhage.  IMPRESSION: Successful CT-guided core biopsy of a right upper lobe nodule.   Electronically Signed   By: Maryclare Bean M.D.   On: 06/02/2013 15:03   Dg Chest Port 1 View  05/18/2013   CLINICAL DATA:  Post bronchoscopy  EXAM: PORTABLE CHEST - 1 VIEW  COMPARISON:  05/18/2013  FINDINGS: COPD. Right upper lobe density unchanged. No pneumothorax. Negative for heart failure or effusion.  IMPRESSION: Right  upper lobe density unchanged. No acute complication following bronchoscopy.   Electronically Signed   By: Franchot Gallo M.D.   On: 05/18/2013 16:03   Dg C-arm Bronchoscopy  05/18/2013   CLINICAL DATA: INTRAOPERATIVE enb   C-ARM BRONCHOSCOPY  Fluoroscopy was utilized by the requesting physician.  No radiographic  interpretation.     ASSESSMENT AND PLAN: This is a very pleasant 78 years old white male recently diagnosed with unresectable stage IIB non-small cell lung cancer, adenocarcinoma, with 2 separate lesions in the right upper lobe and questionable lesion in the left upper lobe. His MRI of the brain showed no evidence  for metastatic disease to the brain. The patient underwent CT-guided core biopsy of the right upper lobe lung nodule for molecular studies but the results are still pending. I recommended for him to see Dr. Tammi Klippel for consideration of stereotactic radiotherapy to the right upper lobe pulmonary nodules. If the molecular studies showed positive EGFR mutation or ALK gene translocation, the patient may be considered for treatment with oral target lesions. Otherwise he will continue on observation with repeat CT scan of the chest in 3 months for restaging of his disease. I would see him back for followup visit in one month for reevaluation after his stereotactic radiotherapy to discuss further treatment options with him. He was advised to call immediately if he has any concerning symptoms in the interval. The patient voices understanding of current disease status and treatment options and is in agreement with the current care plan.  All questions were answered. The patient knows to call the clinic with any problems, questions or concerns. We can certainly see the patient much sooner if necessary.  I spent 15 minutes counseling the patient face to face. The total time spent in the appointment was 25 minutes.  Disclaimer: This note was dictated with voice recognition software.  Similar sounding words can inadvertently be transcribed and may not be corrected upon review.

## 2013-06-28 ENCOUNTER — Encounter: Payer: Self-pay | Admitting: Radiation Oncology

## 2013-06-28 ENCOUNTER — Encounter: Payer: Self-pay | Admitting: *Deleted

## 2013-06-28 NOTE — Progress Notes (Signed)
Thoracic Location of Tumor / Histology: Unresectable stage IIB (T3, N0, M0) non small cell lung cancer, adenocarcinoma diagnosed in March 2015  Patient presented March 2015 with an abnormal CT scan of the chest  Biopsies of RUL lung nodule (if applicable) revealed:    Tobacco/Marijuana/Snuff/ETOH use: been a smoker for more than 50 years recently switched to e-cigs  Past/Anticipated interventions by cardiothoracic surgery, if any: unresectable  Past/Anticipated interventions by medical oncology, if any: if the molecular studies show positive EGFR mutation or ALK gene translocation, the patient may be considered for treatment with oral target lesions. Otherwise he will continue on observation with repeat CT scan of the chest in three months for restaging of his disease.  Signs/Symptoms  Weight changes, if any: Denies weight loss or night sweats  Respiratory complaints, if any: SOB with exertion and mild cough  Hemoptysis, if any: No  Pain issues, if any:  Denies any significant chest pain  SAFETY ISSUES:  Prior radiation? Hx of colon cancer ????  Pacemaker/ICD?   Possible current pregnancy? N/A  Is the patient on methotrexate? No  Current Complaints / other details:  78 year old male. With increased fatigue and weakness. No evidence of brain mets. With 2 separate lesions in the RUL and a questionable lesion in the LUL.

## 2013-06-28 NOTE — Progress Notes (Signed)
Went to [employer] secure website to obtain results.  Genomic Alterations Identified documented.  Completed results printed and place on Dr. Worthy Flank desk.

## 2013-06-29 ENCOUNTER — Encounter: Payer: Self-pay | Admitting: Radiation Oncology

## 2013-06-29 ENCOUNTER — Ambulatory Visit
Admission: RE | Admit: 2013-06-29 | Discharge: 2013-06-29 | Disposition: A | Discharge status: Home / Self Care | Payer: Medicare Other | Source: Ambulatory Visit | Attending: Radiation Oncology | Admitting: Radiation Oncology

## 2013-06-29 ENCOUNTER — Encounter (HOSPITAL_COMMUNITY): Payer: Self-pay

## 2013-06-29 VITALS — BP 155/78 | HR 72 | Temp 98.0°F | Resp 16 | Ht >= 80 in | Wt 145.7 lb

## 2013-06-29 DIAGNOSIS — Z510 Encounter for antineoplastic radiation therapy: Secondary | ICD-10-CM | POA: Insufficient documentation

## 2013-06-29 DIAGNOSIS — C3490 Malignant neoplasm of unspecified part of unspecified bronchus or lung: Secondary | ICD-10-CM

## 2013-06-29 DIAGNOSIS — C3410 Malignant neoplasm of upper lobe, unspecified bronchus or lung: Secondary | ICD-10-CM | POA: Insufficient documentation

## 2013-06-29 NOTE — Progress Notes (Signed)
Radiation Oncology         [phone] ________________________________  Initial Outpatient Consultation  Name: Adam Ward MRN: [id number]  Date: 06/29/2013  DOB: [birth year]  [id number], Betsy Coder, MD  Curt Bears, MD   REFERRING PHYSICIAN: Curt Bears, MD  DIAGNOSIS: 78 year old gentleman with stage T3 N0 M0 adenocarcinoma of the right upper lobe of the lung-stage IIB  HISTORY OF PRESENT ILLNESS::Adam Ward is a 78 y.o. male referred to Dr. Servando Snare because of abnormal CT scan of the chest. He was treated in the spring of 2013 for a COPD exacerbation and question of tuberculosis. He notes exposure to someone with tuberculosis in 1943. Cultures were negative but CT scan showed multiple bilateral ground glass densities. Follow up scans were done in August of 2013 and January 07, 2012. Dr. Servando Snare first saw him 01/25/12. The patient has been a smoker for more than 50 years just recently switching to electronic cigarettes. Chest CT on 05/05/13 showed multiple sub solid pulmonary nodules again, with increased size of a right upper lobe sub solid pulmonary nodule (previously 10 x 12 mm, now 13 x 15 mm). In [facility] conference, this one nodule was felt to be suspicious, warranting biopsy. Initially, he underwent electrical navigation bronchoscopy on 05/18/2013. Biopsy at that time showed a single atypical suspicious gland felt to represent malignancy. Since further information and molecular testing was desired, the patient proceeded to CT guided biopsy on 06/01/2013. That specimen confirmed adenocarcinoma.  The patient is not felt to be an ideal surgical candidate based on primary function testing at age.  Brain MRI on 06/02/2013 showed no evidence of metastatic disease.  He has, been referred today to discuss possible radiation treatment.  PREVIOUS RADIATION THERAPY: No  PAST MEDICAL HISTORY:  has a past medical history of Hypertension; High cholesterol; Anxiety; Rapid atrial fibrillation  (07/28/2011); Cavitary lesion of lung (07/28/2011); Acute respiratory failure with hypoxia (07/26/2011); Bulla of lung (07/28/2011); Tobacco abuse (08/01/2011); COPD (chronic obstructive pulmonary disease); Pneumonia; Numbness and tingling; GERD (gastroesophageal reflux disease); Headache(784.0); Colon cancer (1993); and Lung cancer.    PAST SURGICAL HISTORY: Past Surgical History  Procedure Laterality Date  . Colon surgery    . Cervical disc surgery    . Tonsillectomy    . Appendectomy    . Eye surgery      as a child  . Video bronchoscopy with endobronchial navigation N/A 05/18/2013    Procedure: VIDEO BRONCHOSCOPY WITH ENDOBRONCHIAL NAVIGATION;  Surgeon: [name], MD;  Location: [facility] OR;  Service: Thoracic;  Laterality: N/A;    FAMILY HISTORY: family history includes Heart disease in his brother and mother; Pneumonia in his sister; Stroke in his father.  SOCIAL HISTORY:  reports that he quit smoking about 1 years ago. His smoking use included Cigarettes. He has a 50 pack-year smoking history. He has never used smokeless tobacco. He reports that he does not drink alcohol or use illicit drugs.  ALLERGIES: Celebrex and Tetracyclines & related  MEDICATIONS:  Current Outpatient Prescriptions  Medication Sig Dispense Refill  . alprazolam (XANAX) 2 MG tablet Take 2 mg by mouth 3 (three) times daily.      Marland Kitchen aspirin EC 81 MG tablet Take 81 mg by mouth daily.      . digoxin (LANOXIN) 0.25 MG tablet Take 0.25 mg by mouth daily.      Marland Kitchen diltiazem (CARDIZEM CD) 240 MG 24 hr capsule Take 240 mg by mouth daily.      . Multiple Vitamins-Minerals (MULTIVITAMIN  WITH MINERALS) tablet Take 1 tablet by mouth daily.      . Omega-3 Fatty Acids (FISH OIL) 1000 MG CAPS Take 1 capsule by mouth daily.      . Saw Palmetto, Serenoa repens, (SAW PALMETTO PO) Take 2 tablets by mouth daily.       . simvastatin (ZOCOR) 20 MG tablet Take 20 mg by mouth daily.      Marland Kitchen docusate sodium (COLACE) 100 MG capsule Take  100-200 mg by mouth daily.       Marland Kitchen PROAIR HFA 108 (90 BASE) MCG/ACT inhaler Inhale 1 puff into the lungs every 4 (four) hours as needed for wheezing or shortness of breath.        No current facility-administered medications for this encounter.   Facility-Administered Medications Ordered in Other Encounters  Medication Dose Route Frequency Provider Last Rate Last Dose  . heparin lock flush 100 unit/mL  500 Units Intravenous Once Curt Bears, MD      . sodium chloride 0.9 % injection 10 mL  10 mL Intravenous PRN Curt Bears, MD        REVIEW OF SYSTEMS:  A 15 point review of systems is documented in the electronic medical record. This was obtained by the nursing staff. However, I reviewed this with the patient to discuss relevant findings and make appropriate changes.  A comprehensive review of systems was negative.   PHYSICAL EXAM:  height is 6\' 11"  (2.108 m) and weight is 145 lb 11.2 oz (66.089 kg). His oral temperature is 98 F (36.7 C). His blood pressure is 155/78 and his pulse is 72. His respiration is 16 and oxygen saturation is 96%.   Per med-onc General appearance: alert, cooperative and no distress  Head: Normocephalic, without obvious abnormality, atraumatic  Neck: no adenopathy, no JVD, supple, symmetrical, trachea midline and thyroid not enlarged, symmetric, no tenderness/mass/nodules  Lymph nodes: Cervical, supraclavicular, and axillary nodes normal.  Resp: clear to auscultation bilaterally  Back: symmetric, no curvature. ROM normal. No CVA tenderness.  Cardio: regular rate and rhythm, S1, S2 normal, no murmur, click, rub or gallop  GI: soft, non-tender; bowel sounds normal; no masses, no organomegaly  Extremities: extremities normal, atraumatic, no cyanosis or edema  Neurologic: Alert and oriented X 3, normal strength and tone. Normal symmetric reflexes. Normal coordination and gait On my exam, no significant changes today.  KPS = 100  100 - Normal; no complaints;  no evidence of disease. 90   - Able to carry on normal activity; minor signs or symptoms of disease. 80   - Normal activity with effort; some signs or symptoms of disease. 37   - Cares for self; unable to carry on normal activity or to do active work. 60   - Requires occasional assistance, but is able to care for most of his personal needs. 50   - Requires considerable assistance and frequent medical care. 24   - Disabled; requires special care and assistance. 89   - Severely disabled; hospital admission is indicated although death not imminent. 99   - Very sick; hospital admission necessary; active supportive treatment necessary. 10   - Moribund; fatal processes progressing rapidly. 0     - Dead  Karnofsky DA, Abelmann [place], Craver LS and Burchenal [facility] [phone]) The use of the nitrogen mustards in the palliative treatment of carcinoma: with particular reference to bronchogenic carcinoma Cancer 1 634-56  LABORATORY DATA:  Lab Results  Component Value Date   WBC 6.0 06/15/2013  HGB 14.7 06/15/2013   HCT 45.1 06/15/2013   MCV 92.5 06/15/2013   PLT 206 06/15/2013   Lab Results  Component Value Date   NA 140 06/15/2013   K 4.2 06/15/2013   CL 97 05/18/2013   CO2 33* 06/15/2013   Lab Results  Component Value Date   ALT 7 06/15/2013   AST 16 06/15/2013   ALKPHOS 106 06/15/2013   BILITOT 0.50 06/15/2013     RADIOGRAPHY: Dg Chest 1 View  06/01/2013   CLINICAL DATA:  Right upper lobe nodule. Status post percutaneous needle biopsy.  EXAM: CHEST - 1 VIEW  COMPARISON:  Chest x-ray dated 05/18/2013  FINDINGS: There is an area of pulmonary parenchymal hemorrhage in the right upper lobe after a needle biopsy. No pneumothorax. Lungs are hyperinflated consistent with COPD. Heart size and vascularity are normal.  IMPRESSION: No pneumothorax. Pulmonary parenchymal hemorrhage in the right upper lobe after percutaneous needle biopsy.   Electronically Signed   By: Rozetta Nunnery M.D.   On: 06/01/2013 14:21   Mr Jeri Cos WE Contrast  06/02/2013   CLINICAL DATA:  Lung cancer staging.  EXAM: MRI HEAD WITHOUT AND WITH CONTRAST  TECHNIQUE: Multiplanar, multiecho pulse sequences of the brain and surrounding structures were obtained without and with intravenous contrast.  CONTRAST:  33mL MULTIHANCE GADOBENATE DIMEGLUMINE 529 MG/ML IV SOLN  COMPARISON:  None.  FINDINGS: There is no acute infarct or intracranial hemorrhage. Small, scattered foci of T2 hyperintensity are present in the subcortical and deep cerebral white matter bilaterally, nonspecific but compatible with minimal chronic small vessel ischemic disease. Age-related cerebral atrophy is present. Remote left external capsule lacunar infarct is noted. A small pineal region cyst measures 8 mm. There is no abnormal enhancement.  Prior bilateral cataract surgery is noted. Paranasal sinuses and mastoid air cells are clear. Major intracranial vascular flow voids are preserved. Prior ACDF is partially visualized in the upper cervical spine.  IMPRESSION: 1. No evidence of intracranial metastatic disease. 2. Minimal chronic small vessel ischemic disease.   Electronically Signed   By: Logan Bores   On: 06/02/2013 20:54   Ct Biopsy  06/02/2013   CLINICAL DATA:  Right upper lobe nodule  EXAM: CT-GUIDED BIOPSY OF A RIGHT UPPER LOBE NODULE.  CORE.  MEDICATIONS AND MEDICAL HISTORY: Versed two mg, Fentanyl 100 mcg.  Additional Medications: 9.  ANESTHESIA/SEDATION: Moderate sedation time: 10 minutes  PROCEDURE: The procedure, risks, benefits, and alternatives were explained to the patient. Questions regarding the procedure were encouraged and answered. The patient understands and consents to the procedure.  The right axilla was prepped with Betadine in a sterile fashion, and a sterile drape was applied covering the operative field. A sterile gown and sterile gloves were used for the procedure.  Under CT guidance, a(n) 17 gauge guide needle was advanced into the right upper lobe nodule.  Subsequently four 18 gauge core biopsies were obtained. The guide needle was removed. Final imaging was performed.  Patient tolerated the procedure well without complication. Vital sign monitoring by nursing staff during the procedure will continue as patient is in the special procedures unit for post procedure observation.  FINDINGS: The images document guide needle placement within the right upper lobe nodule. Post biopsy images demonstrate no evidence of hemorrhage.  IMPRESSION: Successful CT-guided core biopsy of a right upper lobe nodule.   Electronically Signed   By: Maryclare Bean M.D.   On: 06/02/2013 15:03      IMPRESSION: This patient is a  very nice 78 year old gentleman with 2-3 foci of well-differentiated subxiphoid adenocarcinoma in the right upper lobe lung. He is not an ideal surgical candidate.  He is eligible for stereotactic body radiotherapy.   PLAN:Today, I talked to the patient and family about the findings and work-up thus far.  We discussed the natural history of disease and general treatment, highlighting the role or radiotherapy in the management.  We discussed the available radiation techniques, and focused on the details of logistics and delivery.  We reviewed the anticipated acute and late sequelae associated with radiation in this setting.  The patient was encouraged to ask questions that I answered to the best of my ability.  I filled out a patient counseling form during our discussion including treatment diagrams.  We retained a copy for our records.  The patient would like to proceed with radiation and will be scheduled for CT simulation.  I spent 60 minutes minutes face to face with the patient and more than 50% of that time was spent in counseling and/or coordination of care.   ------------------------------------------------  Sheral Apley. Tammi Klippel, M.D.

## 2013-06-29 NOTE — Addendum Note (Signed)
Encounter addended by: Deirdre Evener, RN on: 06/29/2013  7:19 PM<BR>     Documentation filed: Charges VN

## 2013-06-29 NOTE — Progress Notes (Signed)
See progress note under physician encounter. 

## 2013-06-29 NOTE — Progress Notes (Signed)
Accompanied by wife, Adam Ward of 86 years. Denies weight loss or night sweats. Reports that he has an excellent appetite. Denies difficulty swallowing but, does report he occasionally has to cough to clear his throat when he eats. Reports he had neck surgery in 2000 following a diving injury when he was a young boy. He reports as a result of the injury he ambulates slowly. Shuffling gait noted. Denies pain at this time. Denies chest pain specifically. Patient denies shortness of breath with exertion. Denies headache, dizziness, nausea or vomiting. Denies diarrhea or constipation.

## 2013-06-30 ENCOUNTER — Encounter: Payer: Self-pay | Admitting: *Deleted

## 2013-06-30 NOTE — [facility] Oncology Navigator Note (Signed)
I called patient to check in and f/u appointment with Dr. Tammi Klippel.  Patient reports that he had all his questions about radiation therapy answered and that he felt comfortable with the care plan.  He reports that he is doing well.  I reviewed his upcoming appointment schedule with patient and his wife.  Patient denied any questions or concerns at this time.  I encouraged him to call me for any needs.

## 2013-07-01 DIAGNOSIS — R7301 Impaired fasting glucose: Secondary | ICD-10-CM | POA: Diagnosis not present

## 2013-07-01 DIAGNOSIS — IMO0002 Reserved for concepts with insufficient information to code with codable children: Secondary | ICD-10-CM | POA: Diagnosis not present

## 2013-07-01 DIAGNOSIS — C3490 Malignant neoplasm of unspecified part of unspecified bronchus or lung: Secondary | ICD-10-CM | POA: Diagnosis not present

## 2013-07-07 ENCOUNTER — Encounter: Payer: Self-pay | Admitting: Radiation Oncology

## 2013-07-07 ENCOUNTER — Ambulatory Visit
Admission: RE | Admit: 2013-07-07 | Discharge: 2013-07-07 | Disposition: A | Discharge status: Home / Self Care | Payer: Medicare Other | Source: Ambulatory Visit | Attending: Radiation Oncology | Admitting: Radiation Oncology

## 2013-07-07 DIAGNOSIS — C3410 Malignant neoplasm of upper lobe, unspecified bronchus or lung: Secondary | ICD-10-CM

## 2013-07-07 DIAGNOSIS — Z510 Encounter for antineoplastic radiation therapy: Secondary | ICD-10-CM | POA: Diagnosis not present

## 2013-07-07 NOTE — Progress Notes (Signed)
  Radiation Oncology         [phone] ________________________________  Name: Adam Ward MRN: [id number]  Date: 07/07/2013  DOB: March 17, 1935  STEREOTACTIC BODY RADIOTHERAPY SIMULATION AND TREATMENT PLANNING NOTE  DIAGNOSIS:  78 year old gentleman with stage T3 N0 M0 multifocal adenocarcinoma of the right upper lobe of the lung-stage IIB  NARRATIVE:  The patient was brought to the [facility].  Identity was confirmed.  All relevant records and images related to the planned course of therapy were reviewed.  The patient freely provided informed written consent to proceed with treatment after reviewing the details related to the planned course of therapy. The consent form was witnessed and verified by the simulation staff.  Then, the patient was set-up in a stable reproducible  supine position for radiation therapy.  A BodyFix immobilization pillow was fabricated for reproducible positioning.  Then I personally applied the abdominal compression paddle to limit respiratory excursion.    RESPIRATORY MOTION MANAGEMENT SIMULATION:  4D respiratoy motion management CT images were obtained.  Surface markings were placed.  The CT images were loaded into the planning software.  Then, using Cine, MIP, and standard views, the internal target volume (ITV) and planning target volumes (PTV) were delinieated, and avoidance structures were contoured.  Treatment planning then occurred.  The radiation prescription was entered and confirmed.  A total of two complex treatment devices were fabricated in the form of the BodyFix immobilization pillow and a neck accuform cushion.  I have requested : 3D Simulation  I have requested a DVH of the following structures: Heart, Lungs, Esophagus, Chest Wall, Brachial Plexus, Major Blood Vessels, and targets.  PLAN:  The patient will receive 54 Gy in 3 fractions to the two biopsied nodules in the right upper lung  ________________________________  Sheral Apley.  Tammi Klippel, M.D.

## 2013-07-13 DIAGNOSIS — Z510 Encounter for antineoplastic radiation therapy: Secondary | ICD-10-CM | POA: Diagnosis not present

## 2013-07-13 DIAGNOSIS — C3410 Malignant neoplasm of upper lobe, unspecified bronchus or lung: Secondary | ICD-10-CM | POA: Diagnosis not present

## 2013-07-18 ENCOUNTER — Ambulatory Visit
Admission: RE | Admit: 2013-07-18 | Discharge: 2013-07-18 | Disposition: A | Discharge status: Home / Self Care | Payer: Medicare Other | Source: Ambulatory Visit | Attending: Radiation Oncology | Admitting: Radiation Oncology

## 2013-07-18 DIAGNOSIS — C3410 Malignant neoplasm of upper lobe, unspecified bronchus or lung: Secondary | ICD-10-CM

## 2013-07-18 DIAGNOSIS — Z510 Encounter for antineoplastic radiation therapy: Secondary | ICD-10-CM | POA: Diagnosis not present

## 2013-07-18 NOTE — Progress Notes (Signed)
  Radiation Oncology         [phone] ________________________________  Name: Adam Ward MRN: [id number]  Date: 07/18/2013  DOB: [birth year]  Simulation verification note  The patient underwent film verification for the patient's set-up in preparation for stereotactic body radiosurgery. The patient was placed on the treatment unit and a CT scan was performed. These images were then fused with the patient's planning CT scan. The fusion was carefully reviewed in terms of the patient's anatomy as it related to the planning CT scan. The target structures as well as the organs at risk were evaluated and also the isodose curves were also reviewed on the patient's treatment CT scan. The target and the normal structures were appropriately aligned for treatment. Therefore the patient proceeded with the first fraction of stereotactic body radiosurgery.  Dose: 18 Gy  ________________________________  Jodelle Gross, MD, PhD

## 2013-07-19 ENCOUNTER — Ambulatory Visit (HOSPITAL_BASED_OUTPATIENT_CLINIC_OR_DEPARTMENT_OTHER): Payer: Medicare Other | Admitting: Internal Medicine

## 2013-07-19 ENCOUNTER — Other Ambulatory Visit (HOSPITAL_BASED_OUTPATIENT_CLINIC_OR_DEPARTMENT_OTHER): Payer: Medicare Other

## 2013-07-19 ENCOUNTER — Telehealth: Payer: Self-pay | Admitting: Internal Medicine

## 2013-07-19 ENCOUNTER — Encounter: Payer: Self-pay | Admitting: Internal Medicine

## 2013-07-19 VITALS — BP 169/81 | HR 81 | Temp 97.7°F | Resp 20 | Ht 77.0 in | Wt 145.8 lb

## 2013-07-19 DIAGNOSIS — C3410 Malignant neoplasm of upper lobe, unspecified bronchus or lung: Secondary | ICD-10-CM

## 2013-07-19 LAB — CBC WITH DIFFERENTIAL/PLATELET
BASO%: 0.3 % (ref 0.0–2.0)
BASOS ABS: 0 10*3/uL (ref 0.0–0.1)
EOS ABS: 0.2 10*3/uL (ref 0.0–0.5)
EOS%: 2.9 % (ref 0.0–7.0)
HEMATOCRIT: 43.9 % (ref 38.4–49.9)
HEMOGLOBIN: 14.3 g/dL (ref 13.0–17.1)
LYMPH%: 30.2 % (ref 14.0–49.0)
MCH: 29.6 pg (ref 27.2–33.4)
MCHC: 32.6 g/dL (ref 32.0–36.0)
MCV: 90.9 fL (ref 79.3–98.0)
MONO#: 0.5 10*3/uL (ref 0.1–0.9)
MONO%: 8.3 % (ref 0.0–14.0)
NEUT%: 58.3 % (ref 39.0–75.0)
NEUTROS ABS: 3.8 10*3/uL (ref 1.5–6.5)
Platelets: 192 10*3/uL (ref 140–400)
RBC: 4.83 10*6/uL (ref 4.20–5.82)
RDW: 12.9 % (ref 11.0–14.6)
WBC: 6.5 10*3/uL (ref 4.0–10.3)
lymph#: 2 10*3/uL (ref 0.9–3.3)

## 2013-07-19 LAB — COMPREHENSIVE METABOLIC PANEL (CC13)
ALBUMIN: 3.7 g/dL (ref 3.5–5.0)
ALK PHOS: 97 U/L (ref 40–150)
ALT: 11 U/L (ref 0–55)
AST: 20 U/L (ref 5–34)
Anion Gap: 12 mEq/L — ABNORMAL HIGH (ref 3–11)
BUN: 9.8 mg/dL (ref 7.0–26.0)
CO2: 27 mEq/L (ref 22–29)
CREATININE: 0.8 mg/dL (ref 0.7–1.3)
Calcium: 9.1 mg/dL (ref 8.4–10.4)
Chloride: 96 mEq/L — ABNORMAL LOW (ref 98–109)
Glucose: 100 mg/dl (ref 70–140)
POTASSIUM: 4.5 meq/L (ref 3.5–5.1)
Sodium: 135 mEq/L — ABNORMAL LOW (ref 136–145)
Total Bilirubin: 0.56 mg/dL (ref 0.20–1.20)
Total Protein: 6.9 g/dL (ref 6.4–8.3)

## 2013-07-19 NOTE — Telephone Encounter (Signed)
Gave pt appt for lab and MD on August 2015

## 2013-07-19 NOTE — Progress Notes (Signed)
[place] Telephone:(336) [phone]   Fax:(336) [phone] OFFICE PROGRESS NOTE  Adam Kilts, MD [address] [place] [address]  DIAGNOSIS: Unresectable he stage IIB (T3, N0, M0) non-small cell lung cancer, adenocarcinoma with positive EGFR amplification (G598V, G719A) diagnosed in March of 2015.  PRIOR THERAPY: None  CURRENT THERAPY: Stereotactic radiotherapy to the right upper lobe pulmonary nodules under the care of Dr. Tammi Klippel.  INTERVAL HISTORY: Adam Ward 78 y.o. male returns to the clinic today for followup visit accompanied by his wife. The patient is feeling fine today with no specific complaints except for increasing fatigue and weakness. He is currently undergoing stereotactic radiotherapy to the pulmonary nodules under the care of Dr. Tammi Klippel. He received 1 treatment and still is scheduled for two more treatment. He denied having any significant chest pain but has shortness of breath with exertion with mild cough with no hemoptysis. The patient denied having any significant weight loss or night sweats. He denied having any fever or chills, no nausea or vomiting. The molecular studies of his tumor showed positive EGFR amplification but no other mutations. He is here today for evaluation and discussion of his treatment options.  MEDICAL HISTORY: Past Medical History  Diagnosis Date  . Hypertension   . High cholesterol   . Anxiety   . Rapid atrial fibrillation 07/28/2011    Paroxysmal, newly diagnosed.  . Cavitary lesion of lung 07/28/2011    AFB smears x3 negative.  . Acute respiratory failure with hypoxia 07/26/2011  . Bulla of lung 07/28/2011  . Tobacco abuse 08/01/2011  . COPD (chronic obstructive pulmonary disease)   . Pneumonia   . Numbness and tingling     Hx: of in right hand and B/LLE  . GERD (gastroesophageal reflux disease)     Hx: of  . Headache(784.0)   . Colon cancer 1993  . Lung cancer     ALLERGIES:   is allergic to celebrex and tetracyclines & related.  MEDICATIONS:  Current Outpatient Prescriptions  Medication Sig Dispense Refill  . alprazolam (XANAX) 2 MG tablet Take 2 mg by mouth 3 (three) times daily.      Marland Kitchen aspirin EC 81 MG tablet Take 81 mg by mouth daily.      . digoxin (LANOXIN) 0.25 MG tablet Take 0.25 mg by mouth daily.      Marland Kitchen diltiazem (CARDIZEM CD) 240 MG 24 hr capsule Take 240 mg by mouth daily.      Marland Kitchen docusate sodium (COLACE) 100 MG capsule Take 100-200 mg by mouth daily.       . Multiple Vitamins-Minerals (MULTIVITAMIN WITH MINERALS) tablet Take 1 tablet by mouth daily.      . Omega-3 Fatty Acids (FISH OIL) 1000 MG CAPS Take 1 capsule by mouth daily.      Marland Kitchen PROAIR HFA 108 (90 BASE) MCG/ACT inhaler Inhale 1 puff into the lungs every 4 (four) hours as needed for wheezing or shortness of breath.       . Saw Palmetto, Serenoa repens, (SAW PALMETTO PO) Take 2 tablets by mouth daily.       . simvastatin (ZOCOR) 20 MG tablet Take 20 mg by mouth daily.       No current facility-administered medications for this visit.   Facility-Administered Medications Ordered in Other Visits  Medication Dose Route Frequency Provider Last Rate Last Dose  . heparin lock flush 100 unit/mL  500 Units Intravenous Once [facility],  MD      . sodium chloride 0.9 % injection 10 mL  10 mL Intravenous PRN Curt Bears, MD        SURGICAL HISTORY:  Past Surgical History  Procedure Laterality Date  . Colon surgery    . Cervical disc surgery    . Tonsillectomy    . Appendectomy    . Eye surgery      as a child  . Video bronchoscopy with endobronchial navigation N/A 05/18/2013    Procedure: VIDEO BRONCHOSCOPY WITH ENDOBRONCHIAL NAVIGATION;  Surgeon: [name], MD;  Location: [facility] OR;  Service: Thoracic;  Laterality: N/A;    REVIEW OF SYSTEMS:  Constitutional: positive for fatigue Eyes: negative Ears, nose, mouth, throat, and face: negative Respiratory: positive for dyspnea on  exertion Cardiovascular: negative Gastrointestinal: negative Genitourinary:negative Integument/breast: negative Hematologic/lymphatic: negative Musculoskeletal:negative Neurological: negative Behavioral/Psych: negative Endocrine: negative Allergic/Immunologic: negative   PHYSICAL EXAMINATION: General appearance: alert, cooperative and no distress Head: Normocephalic, without obvious abnormality, atraumatic Neck: no adenopathy, no JVD, supple, symmetrical, trachea midline and thyroid not enlarged, symmetric, no tenderness/mass/nodules Lymph nodes: Cervical, supraclavicular, and axillary nodes normal. Resp: clear to auscultation bilaterally Back: symmetric, no curvature. ROM normal. No CVA tenderness. Cardio: regular rate and rhythm, S1, S2 normal, no murmur, click, rub or gallop GI: soft, non-tender; bowel sounds normal; no masses,  no organomegaly Extremities: extremities normal, atraumatic, no cyanosis or edema Neurologic: Alert and oriented X 3, normal strength and tone. Normal symmetric reflexes. Normal coordination and gait  ECOG PERFORMANCE STATUS: 1 - Symptomatic but completely ambulatory  Blood pressure 169/81, pulse 81, temperature 97.7 F (36.5 C), temperature source Oral, resp. rate 20, height '6\' 5"'  (1.956 m), weight 145 lb 12.8 oz (66.134 kg).  LABORATORY DATA: Lab Results  Component Value Date   WBC 6.5 07/19/2013   HGB 14.3 07/19/2013   HCT 43.9 07/19/2013   MCV 90.9 07/19/2013   PLT 192 07/19/2013      Chemistry      Component Value Date/Time   NA 140 06/15/2013 1301   NA 137 05/18/2013 0919   K 4.2 06/15/2013 1301   K 4.2 05/18/2013 0919   CL 97 05/18/2013 0919   CO2 33* 06/15/2013 1301   CO2 30 05/18/2013 0919   BUN 10.1 06/15/2013 1301   BUN 9 05/18/2013 0919   CREATININE 0.9 06/15/2013 1301   CREATININE 0.86 05/18/2013 0919   CREATININE 0.88 01/04/2013 1627      Component Value Date/Time   CALCIUM 9.2 06/15/2013 1301   CALCIUM 9.2 05/18/2013 0919   ALKPHOS 106  06/15/2013 1301   ALKPHOS 96 05/18/2013 0919   AST 16 06/15/2013 1301   AST 18 05/18/2013 0919   ALT 7 06/15/2013 1301   ALT 9 05/18/2013 0919   BILITOT 0.50 06/15/2013 1301   BILITOT 0.4 05/18/2013 0919       RADIOGRAPHIC STUDIES:  ASSESSMENT AND PLAN: This is a very pleasant 78 years old white male recently diagnosed with unresectable stage IIB non-small cell lung cancer, adenocarcinoma, with 2 separate lesions in the right upper lobe and questionable lesion in the left upper lobe. The patient is currently undergoing stereotactic radiotherapy to the pulmonary nodules under the care of Dr. Tammi Klippel. His molecular studies showed positive EGFR amplification and he may be considered in the future for treatment with Tarceva kinase inhibitor if he has evidence for disease progression after the stereotactic radiotherapy. For now I recommended for the patient to continue on observation with repeat CT scan  of the chest in 3 months for reevaluation of his disease after completion of the stereotactic radiotherapy to the pulmonary nodules. The patient and his wife agreed to the current plan. He was advised to call immediately if he has any concerning symptoms in the interval. The patient voices understanding of current disease status and treatment options and is in agreement with the current care plan.  All questions were answered. The patient knows to call the clinic with any problems, questions or concerns. We can certainly see the patient much sooner if necessary.  Disclaimer: This note was dictated with voice recognition software. Similar sounding words can inadvertently be transcribed and may not be corrected upon review.

## 2013-07-20 ENCOUNTER — Ambulatory Visit
Admission: RE | Admit: 2013-07-20 | Discharge: 2013-07-20 | Disposition: A | Discharge status: Home / Self Care | Payer: Medicare Other | Source: Ambulatory Visit | Attending: Radiation Oncology | Admitting: Radiation Oncology

## 2013-07-20 DIAGNOSIS — C3410 Malignant neoplasm of upper lobe, unspecified bronchus or lung: Secondary | ICD-10-CM | POA: Diagnosis not present

## 2013-07-20 DIAGNOSIS — Z510 Encounter for antineoplastic radiation therapy: Secondary | ICD-10-CM | POA: Diagnosis not present

## 2013-07-21 NOTE — Progress Notes (Signed)
  Radiation Oncology         [phone]) [phone] ________________________________  Name: Adam Ward MRN: [id number]  Date: 07/20/2013  DOB: 25-Nov-1935   Simulation verification note  The patient underwent film verification for the patient's set-up in preparation for stereotactic body radiosurgery. The patient was placed on the treatment unit and a CT scan was performed. These images were then fused with the patient's planning CT scan. The fusion was carefully reviewed in terms of the patient's anatomy as it related to the planning CT scan. The target structures as well as the organs at risk were evaluated on the patient's treatment CT scan. The target and the normal structures were appropriately aligned for treatment. Therefore the patient proceeded with the fraction of stereotactic body radiosurgery.  Fraction: 2  Dose:  36 Gy   ________________________________  Jodelle Gross, MD, PhD

## 2013-07-26 ENCOUNTER — Ambulatory Visit
Admission: RE | Admit: 2013-07-26 | Discharge: 2013-07-26 | Disposition: A | Discharge status: Home / Self Care | Payer: Medicare Other | Source: Ambulatory Visit | Attending: Radiation Oncology | Admitting: Radiation Oncology

## 2013-07-26 ENCOUNTER — Encounter: Payer: Self-pay | Admitting: Radiation Oncology

## 2013-07-26 VITALS — BP 129/71 | HR 77 | Resp 16 | Wt 146.5 lb

## 2013-07-26 DIAGNOSIS — Z510 Encounter for antineoplastic radiation therapy: Secondary | ICD-10-CM | POA: Diagnosis not present

## 2013-07-26 DIAGNOSIS — C3410 Malignant neoplasm of upper lobe, unspecified bronchus or lung: Secondary | ICD-10-CM | POA: Diagnosis not present

## 2013-07-26 NOTE — Progress Notes (Signed)
°  Radiation Oncology         (336) [phone] ________________________________  Name: Adam Ward MRN: [id number]  Date: 07/26/2013  DOB: Sep 25, 1935  End of Treatment Note  Diagnosis:   78 year old gentleman with stage T3 N0 M0 multifocal adenocarcinoma of the right upper lobe of the lung-stage IIB  Indication for treatment:  Curative       Radiation treatment dates:   07/18/2013, 07/20/13, 07/26/2013  Site/dose:   2 adjacent biopsy-proven lung nodules in the right upper lung were treated to 54 gray in 3 fractions of 18 gray.  Beams/energy:   Stereotactic body radiotherapy was delivered using 3-D conformal techniques with a body fix immobilization system and daily cone beam CT prior to treatment. Volumetric ARC fields were delivered using 6 megavolt photons in the flattening filter free beam mode.  Narrative: The patient tolerated radiation treatment relatively well.   No acute complications occurred.  Plan: The patient has completed radiation treatment. The patient will return to radiation oncology clinic for routine followup in one month. I advised he and his wife to call or return sooner if they have any questions or concerns related to their recovery or treatment. ________________________________  Sheral Apley. Tammi Klippel, M.D.

## 2013-07-26 NOTE — Progress Notes (Signed)
  Radiation Oncology         [phone] ________________________________  Name: Adam Ward MRN: [id number]  Date: 07/26/2013  DOB: Aug 16, 1935  Weekly Radiation Therapy Management  Lung cancer, right  upper lobe   Primary site: Lung (Right)   Staging method: AJCC 7th Edition   Clinical: Stage IIB (T3, N0, M0)    Summary: Stage IIB (T3, N0, M0)   Current Dose: 54 Gy     Planned Dose:  54 Gy  Narrative . . . . . . . . The patient presents for routine under treatment assessment.                                   The patient is without complaint.                                 Set-up films were reviewed.                                 The chart was checked. Physical Findings. . .  weight is 146 lb 8 oz (66.452 kg). His blood pressure is 129/71 and his pulse is 77. His respiration is 16. . Weight essentially stable.  The lungs are clear. The heart has a regular rhythm and rate Impression . . . . . . . The patient  tolerated radiation well. Plan . . . . . . . . . . . Marland Kitchen routine followup in one month with Dr. Tammi Klippel ________________________________   Blair Promise, PhD, MD

## 2013-07-26 NOTE — Progress Notes (Signed)
Patient completed 3/3 SBRT planned treatments to right lung. Patient reports cough with clear sputum. Denies pain. Denies difficulty swallowing or pain associated with swallowing. Weight stable. Denies skin changes to chest or upper back. Reports shortness of breath has greatly improved. Reports he was able to walk a little over a mile uphill without feeling winded recently. One month follow up appointment card given.

## 2013-07-26 NOTE — Progress Notes (Signed)
  Radiation Oncology         [phone] ________________________________  Name: Esgar Barnick Cham MRN: [id number]  Date: 07/26/2013  DOB: 1936-02-24  Stereotactic Body Radiotherapy Treatment Procedure Note  NARRATIVE:  Coralyn Mark A Tegethoff was brought to the stereotactic radiation treatment machine and placed supine on the CT couch. The patient was set up for stereotactic body radiotherapy on the body fix pillow.  3D TREATMENT PLANNING AND DOSIMETRY:  The patient's radiation plan was reviewed and approved prior to starting treatment.  It showed 3-dimensional radiation distributions overlaid onto the planning CT.  [facility] for the target structures as well as the organs at risk were reviewed. The documentation of this is filed in the radiation oncology EMR.  SIMULATION VERIFICATION:  The patient underwent CT imaging on the treatment unit.  These were carefully aligned to document that the ablative radiation dose would cover the right lung target volumes and maximally spare the nearby organs at risk according to the planned distribution. He received a further 1800 cGy today for a cumulative dose of 5400 cGy 3 sessions to both lesions within the right upper lobe.  SPECIAL TREATMENT PROCEDURE: Coralyn Mark A Sartin received high dose ablative stereotactic body radiotherapy to the planned target volume without unforeseen complications. Treatment was delivered uneventfully. The high doses associated with stereotactic body radiotherapy and the significant potential risks require careful treatment set up and patient monitoring constituting a special treatment procedure   STEREOTACTIC TREATMENT MANAGEMENT:  Following delivery, the patient was evaluated clinically. The patient tolerated treatment without significant acute effects, and was discharged to home in stable condition.    PLAN: Continue treatment as planned.  ------------------------------------------------        Rexene Edison, MD

## 2013-08-29 ENCOUNTER — Encounter: Payer: Self-pay | Admitting: Adult Health

## 2013-08-29 ENCOUNTER — Ambulatory Visit (INDEPENDENT_AMBULATORY_CARE_PROVIDER_SITE_OTHER): Payer: Medicare Other | Admitting: Adult Health

## 2013-08-29 VITALS — BP 130/70 | HR 56 | Ht 73.0 in | Wt 148.0 lb

## 2013-08-29 DIAGNOSIS — I4891 Unspecified atrial fibrillation: Secondary | ICD-10-CM

## 2013-08-29 DIAGNOSIS — F17200 Nicotine dependence, unspecified, uncomplicated: Secondary | ICD-10-CM | POA: Diagnosis not present

## 2013-08-29 DIAGNOSIS — C3410 Malignant neoplasm of upper lobe, unspecified bronchus or lung: Secondary | ICD-10-CM

## 2013-08-29 DIAGNOSIS — Z720 Tobacco use: Secondary | ICD-10-CM

## 2013-08-29 NOTE — Progress Notes (Signed)
HPI: Adam Ward is a 78 year old patient to be est. with Adam Ward  for ongoing assessment and management of hypertension with history of atrial fibrillation and hypercholesterolemia. The patient was last seen in the office in November of 2014 had not had any significant complaints. He denied rapid palpitations fluid retention or chest pain. His son be medically compliant.  The patient has been followed by Adam Ward secondary to stage TIII N0 MO adenocarcinoma of the right upper lung stage 11B. he was seen by Adam Ward in April of 2015. He is proceeding with radiation has not undergone chemotherapy at time of Adam Ward note.  He comes today without any cardiac complaints. He denies chest pain rapid heart rhythm dizziness or shortness of breath. He is medically compliant. Labs are being completed by primary care and by oncology.   Allergies  Allergen Reactions  . Celebrex [Celecoxib] Other (See Comments)    Bothers the patients eyes.  . Tetracyclines & Related Other (See Comments)    Breaks out in mouth.    Current Outpatient Prescriptions  Medication Sig Dispense Refill  . alprazolam (XANAX) 2 MG tablet Take 2 mg by mouth 3 (three) times daily.      Marland Kitchen aspirin EC 81 MG tablet Take 81 mg by mouth daily.      . digoxin (LANOXIN) 0.25 MG tablet Take 0.25 mg by mouth daily.      Marland Kitchen diltiazem (CARDIZEM CD) 240 MG 24 hr capsule Take 240 mg by mouth daily.      Marland Kitchen docusate sodium (COLACE) 100 MG capsule Take 100-200 mg by mouth daily.       . Multiple Vitamins-Minerals (MULTIVITAMIN WITH MINERALS) tablet Take 1 tablet by mouth daily.      . Omega-3 Fatty Acids (FISH OIL) 1000 MG CAPS Take 1 capsule by mouth daily.      Marland Kitchen PROAIR HFA 108 (90 BASE) MCG/ACT inhaler Inhale 1 puff into the lungs every 4 (four) hours as needed for wheezing or shortness of breath.       . Saw Palmetto, Serenoa repens, (SAW PALMETTO PO) Take 2 tablets by mouth daily.       . simvastatin (ZOCOR) 20 MG  tablet Take 20 mg by mouth daily.       No current facility-administered medications for this visit.   Facility-Administered Medications Ordered in Other Visits  Medication Dose Route Frequency Provider Last Rate Last Dose  . heparin lock flush 100 unit/mL  500 Units Intravenous Once Curt Bears, MD      . sodium chloride 0.9 % injection 10 mL  10 mL Intravenous PRN Curt Bears, MD        Past Medical History  Diagnosis Date  . Hypertension   . High cholesterol   . Anxiety   . Rapid atrial fibrillation 07/28/2011    Paroxysmal, newly diagnosed.  . Cavitary lesion of lung 07/28/2011    AFB smears x3 negative.  . Acute respiratory failure with hypoxia 07/26/2011  . Bulla of lung 07/28/2011  . Tobacco abuse 08/01/2011  . COPD (chronic obstructive pulmonary disease)   . Pneumonia   . Numbness and tingling     Hx: of in right hand and B/LLE  . GERD (gastroesophageal reflux disease)     Hx: of  . Headache(784.0)   . Colon cancer 1993  . Lung cancer     Past Surgical History  Procedure Laterality Date  . Colon surgery    . Cervical disc surgery    .  Tonsillectomy    . Appendectomy    . Eye surgery      as a child  . Video bronchoscopy with endobronchial navigation N/A 05/18/2013    Procedure: VIDEO BRONCHOSCOPY WITH ENDOBRONCHIAL NAVIGATION;  Surgeon: Adam Isaac, MD;  Location: [facility] OR;  Service: Thoracic;  Laterality: N/A;    ROS: Review of systems complete and found to be negative unless listed above  PHYSICAL EXAM BP 130/70  Pulse 56  Ht 6\' 1"  (1.854 m)  Wt 148 lb (67.132 kg)  BMI 19.53 kg/m2  SpO2 95% General: Well developed, well nourished, in no acute distress Head: Eyes PERRLA, No xanthomas.   Normal cephalic and atramatic  Lungs: Clear bilaterally to auscultation and percussion. Heart: HRRR S1 S2, without MRG.  Pulses are 2+ & equal.            No carotid bruit. No JVD.  No abdominal bruits. No femoral bruits. Abdomen: Bowel sounds are positive,  abdomen soft and non-tender without masses or                  Hernia's noted. Msk:  Back normal, normal gait. Normal strength and tone for age. Extremities: No clubbing, cyanosis or edema.  DP +1 Neuro: Alert and oriented X 3. Psych:  Good affect, responds appropriately     ASSESSMENT AND PLAN

## 2013-08-29 NOTE — Patient Instructions (Signed)
Your physician wants you to follow-up in: 1 year with Dr. Koneswaran.  You will receive a reminder letter in the mail two months in advance. If you don't receive a letter, please call our office to schedule the follow-up appointment.  Your physician recommends that you continue on your current medications as directed. Please refer to the Current Medication list given to you today.  Thank you for choosing [facility]!   

## 2013-08-29 NOTE — Assessment & Plan Note (Signed)
Has smoking due to recent diagnosis of lung cancer.

## 2013-08-29 NOTE — Assessment & Plan Note (Signed)
Patient's heart rate remains normal on diltiazem and digoxin. We will continue him on current medication regimen as is tolerating it, blood pressure is well-controlled, she is asymptomatic for recurrence of irregular heart rhythm. Patient is on aspirin only and is not on anticoagulation currently as he remains in normal sinus rhythm. I would like to repeat his echocardiogram in one year for reevaluation of LV size and function

## 2013-08-29 NOTE — Assessment & Plan Note (Signed)
Followed by oncology. Due to have followup CT scan next month. He is on radiation therapy only without chemotherapy this time.

## 2013-08-29 NOTE — Progress Notes (Deleted)
Name: Adam Ward    DOB: [birth year]  Age: 78 y.o.  MR#: [id number]       PCP:  Purvis Kilts, MD      Insurance: Payor: MEDICARE / Plan: MEDICARE PART A AND B / Product Type: *No Product type* /   CC:    Chief Complaint  Patient presents with  . Atrial Fibrillation  . Hypertension    VS Filed Vitals:   08/29/13 1502  BP: 130/70  Pulse: 56  Height: 6\' 1"  (1.854 m)  Weight: 148 lb (67.132 kg)  SpO2: 95%    Weights Current Weight  08/29/13 148 lb (67.132 kg)  07/26/13 146 lb 8 oz (66.452 kg)  07/19/13 145 lb 12.8 oz (66.134 kg)    Blood Pressure  BP Readings from Last 3 Encounters:  08/29/13 130/70  07/26/13 129/71  07/19/13 169/81     Admit date:  (Not on file) Last encounter with RMR:  Visit date not found   Allergy Celebrex and Tetracyclines & related  Current Outpatient Prescriptions  Medication Sig Dispense Refill  . alprazolam (XANAX) 2 MG tablet Take 2 mg by mouth 3 (three) times daily.      Marland Kitchen aspirin EC 81 MG tablet Take 81 mg by mouth daily.      . digoxin (LANOXIN) 0.25 MG tablet Take 0.25 mg by mouth daily.      Marland Kitchen diltiazem (CARDIZEM CD) 240 MG 24 hr capsule Take 240 mg by mouth daily.      Marland Kitchen docusate sodium (COLACE) 100 MG capsule Take 100-200 mg by mouth daily.       . Multiple Vitamins-Minerals (MULTIVITAMIN WITH MINERALS) tablet Take 1 tablet by mouth daily.      . Omega-3 Fatty Acids (FISH OIL) 1000 MG CAPS Take 1 capsule by mouth daily.      Marland Kitchen PROAIR HFA 108 (90 BASE) MCG/ACT inhaler Inhale 1 puff into the lungs every 4 (four) hours as needed for wheezing or shortness of breath.       . Saw Palmetto, Serenoa repens, (SAW PALMETTO PO) Take 2 tablets by mouth daily.       . simvastatin (ZOCOR) 20 MG tablet Take 20 mg by mouth daily.       No current facility-administered medications for this visit.   Facility-Administered Medications Ordered in Other Visits  Medication Dose Route Frequency Provider Last Rate Last Dose  . heparin lock flush  100 unit/mL  500 Units Intravenous Once Curt Bears, MD      . sodium chloride 0.9 % injection 10 mL  10 mL Intravenous PRN Curt Bears, MD        Discontinued Meds:   There are no discontinued medications.  Patient Active Problem List   Diagnosis Date Noted  . Pure hypercholesterolemia 07/06/2012  . Tobacco abuse 08/01/2011  . Acute bronchitis 07/28/2011  . Lung cancer, right  upper lobe 07/28/2011  . Bulla of lung 07/28/2011  . Cavitary lesion of lung 07/28/2011  . Rapid atrial fibrillation 07/28/2011  . Hyperglycemia 07/27/2011  . Elevated brain natriuretic peptide (BNP) level 07/27/2011  . CAP (community acquired pneumonia) 07/26/2011  . Acute respiratory failure with hypoxia 07/26/2011  . Hypertension   . Anxiety     LABS    Component Value Date/Time   NA 135* 07/19/2013 1032   NA 140 06/15/2013 1301   NA 137 05/18/2013 0919   NA 134* 01/04/2013 1627   NA 135 07/06/2012 1220   K 4.5 07/19/2013 1032  K 4.2 06/15/2013 1301   K 4.2 05/18/2013 0919   K 4.3 01/04/2013 1627   K 4.5 07/06/2012 1220   CL 97 05/18/2013 0919   CL 98 01/04/2013 1627   CL 92* 07/06/2012 1220   CO2 27 07/19/2013 1032   CO2 33* 06/15/2013 1301   CO2 30 05/18/2013 0919   CO2 27 01/04/2013 1627   CO2 31 07/06/2012 1220   GLUCOSE 100 07/19/2013 1032   GLUCOSE 88 06/15/2013 1301   GLUCOSE 101* 05/18/2013 0919   GLUCOSE 86 01/04/2013 1627   GLUCOSE 87 07/06/2012 1220   BUN 9.8 07/19/2013 1032   BUN 10.1 06/15/2013 1301   BUN 9 05/18/2013 0919   BUN 9 01/04/2013 1627   BUN 7 07/06/2012 1220   CREATININE 0.8 07/19/2013 1032   CREATININE 0.9 06/15/2013 1301   CREATININE 0.86 05/18/2013 0919   CREATININE 0.88 01/04/2013 1627   CREATININE 0.96 07/06/2012 1220   CREATININE 0.85 08/01/2011 0517   CREATININE 0.72 07/30/2011 0451   CALCIUM 9.1 07/19/2013 1032   CALCIUM 9.2 06/15/2013 1301   CALCIUM 9.2 05/18/2013 0919   CALCIUM 8.9 01/04/2013 1627   CALCIUM 9.5 07/06/2012 1220   GFRNONAA 83* 05/18/2013 0919   GFRNONAA 84*  08/01/2011 0517   GFRNONAA >90 07/30/2011 0451   GFRAA >90 05/18/2013 0919   GFRAA >90 08/01/2011 0517   GFRAA >90 07/30/2011 0451   CMP     Component Value Date/Time   NA 135* 07/19/2013 1032   NA 137 05/18/2013 0919   K 4.5 07/19/2013 1032   K 4.2 05/18/2013 0919   CL 97 05/18/2013 0919   CO2 27 07/19/2013 1032   CO2 30 05/18/2013 0919   GLUCOSE 100 07/19/2013 1032   GLUCOSE 101* 05/18/2013 0919   BUN 9.8 07/19/2013 1032   BUN 9 05/18/2013 0919   CREATININE 0.8 07/19/2013 1032   CREATININE 0.86 05/18/2013 0919   CREATININE 0.88 01/04/2013 1627   CALCIUM 9.1 07/19/2013 1032   CALCIUM 9.2 05/18/2013 0919   PROT 6.9 07/19/2013 1032   PROT 7.2 05/18/2013 0919   ALBUMIN 3.7 07/19/2013 1032   ALBUMIN 3.8 05/18/2013 0919   AST 20 07/19/2013 1032   AST 18 05/18/2013 0919   ALT 11 07/19/2013 1032   ALT 9 05/18/2013 0919   ALKPHOS 97 07/19/2013 1032   ALKPHOS 96 05/18/2013 0919   BILITOT 0.56 07/19/2013 1032   BILITOT 0.4 05/18/2013 0919   GFRNONAA 83* 05/18/2013 0919   GFRAA >90 05/18/2013 0919       Component Value Date/Time   WBC 6.5 07/19/2013 1032   WBC 6.0 06/15/2013 1301   WBC 7.1 06/01/2013 1023   WBC 7.6 05/18/2013 0919   WBC 7.5 07/06/2012 1220   HGB 14.3 07/19/2013 1032   HGB 14.7 06/15/2013 1301   HGB 14.2 06/01/2013 1023   HGB 15.4 05/18/2013 0919   HGB 16.1 07/06/2012 1220   HCT 43.9 07/19/2013 1032   HCT 45.1 06/15/2013 1301   HCT 42.4 06/01/2013 1023   HCT 46.2 05/18/2013 0919   HCT 47.8 07/06/2012 1220   MCV 90.9 07/19/2013 1032   MCV 92.5 06/15/2013 1301   MCV 91.8 06/01/2013 1023   MCV 92.2 05/18/2013 0919   MCV 90.0 07/06/2012 1220    Lipid Panel     Component Value Date/Time   CHOL 160 07/06/2012 1220   TRIG 97 07/06/2012 1220   HDL 48 07/06/2012 1220   CHOLHDL 3.3 07/06/2012 1220   VLDL 19 07/06/2012 1220  [place] 93 07/06/2012 1220    ABG No results found for this basename: phart, pco2, pco2art, po2, po2art, hco3, tco2, acidbasedef, o2sat     Lab Results  Component Value Date   TSH 1.631  07/28/2011   BNP (last 3 results) No results found for this basename: PROBNP,  in the last 8760 hours Cardiac Panel (last 3 results) No results found for this basename: CKTOTAL, CKMB, TROPONINI, RELINDX,  in the last 72 hours  Iron/TIBC/Ferritin No results found for this basename: iron, tibc, ferritin     EKG Orders placed in visit on 07/06/12  . EKG 12-LEAD     Prior Assessment and Plan Problem List as of 08/29/2013     Cardiovascular and Mediastinum   Hypertension   Last Assessment & Plan   01/04/2013 Office Visit Written 01/04/2013  4:07 PM by Lendon Colonel, NP     Good control of BP at this time. Continue current medication regimen    Rapid atrial fibrillation   Last Assessment & Plan   01/04/2013 Office Visit Edited 01/04/2013  4:09 PM by Lendon Colonel, NP     Heart rate is well controlled currently. He is only on ASA. Due to blurred vision, I will check a dig level and BMET. He will be seen again in 6 months. He is constipated from CCB. I have asked him to take colace at [facility] and hold for loose stools.      Respiratory   CAP (community acquired pneumonia)   Acute respiratory failure with hypoxia   Acute bronchitis   Lung cancer, right  upper lobe   Last Assessment & Plan   01/08/2012 Office Visit Written 01/08/2012  3:04 PM by Lendon Colonel, NP     CT scan completed on 01/07/2012 is reviewed.  It is unchanged from CT scan completed in May of 2013. It is recommended that he have follow up CT in 12 months. This will be managed by Dr. Hilma Favors.      Other   Anxiety   Hyperglycemia   Elevated brain natriuretic peptide (BNP) level   Bulla of lung   Cavitary lesion of lung   Tobacco abuse   Last Assessment & Plan   01/04/2013 Office Visit Written 01/04/2013  4:08 PM by Lendon Colonel, NP     Now using electronic vapor cigarette. He is wheezing. I have asked him to use inhaler BID for the next two days.    Pure hypercholesterolemia   Last Assessment & Plan    07/06/2012 Office Visit Written 07/06/2012 11:55 AM by Lendon Colonel, NP     Remains on statin. Check fasting lipids and LFTs.        Imaging: No results found.

## 2013-09-14 ENCOUNTER — Encounter: Payer: Self-pay | Admitting: Radiation Oncology

## 2013-09-14 NOTE — Progress Notes (Signed)
  Radiation Oncology         [phone] ________________________________  Name: Adam Ward MRN: [id number]  Date: 09/15/2013  DOB: 07/06/1935  Follow-Up Visit Note  CC: Purvis Kilts, MD  Halford Chessman, MD  Diagnosis:   78 year old gentleman with stage T3 N0 M0 multifocal adenocarcinoma of the right upper lobe of the lung-stage IIB s/p Curative SBRT 07/18/2013, 07/20/13, 07/26/2013 where 2 adjacent biopsy-proven lung nodules in the right upper lung were treated to 54 gray in 3 fractions   Interval Since Last Radiation:  2  months  Narrative:  The patient returns today for routine follow-up.  He denies SOB, and pain today. BP elevated as is documented, but he denies any dizziness.                               ALLERGIES:  is allergic to celebrex and tetracyclines & related.  Meds: Current Outpatient Prescriptions  Medication Sig Dispense Refill  . alprazolam (XANAX) 2 MG tablet Take 2 mg by mouth 3 (three) times daily.      Marland Kitchen aspirin EC 81 MG tablet Take 81 mg by mouth daily.      . digoxin (LANOXIN) 0.25 MG tablet Take 0.25 mg by mouth daily.      Marland Kitchen diltiazem (CARDIZEM CD) 240 MG 24 hr capsule Take 240 mg by mouth daily.      Marland Kitchen docusate sodium (COLACE) 100 MG capsule Take 100-200 mg by mouth daily.       . Multiple Vitamins-Minerals (MULTIVITAMIN WITH MINERALS) tablet Take 1 tablet by mouth daily.      . Omega-3 Fatty Acids (FISH OIL) 1000 MG CAPS Take 1 capsule by mouth daily.      Marland Kitchen PROAIR HFA 108 (90 BASE) MCG/ACT inhaler Inhale 1 puff into the lungs every 4 (four) hours as needed for wheezing or shortness of breath.       . Saw Palmetto, Serenoa repens, (SAW PALMETTO PO) Take 2 tablets by mouth daily.       . simvastatin (ZOCOR) 20 MG tablet Take 20 mg by mouth daily.       No current facility-administered medications for this encounter.   Facility-Administered Medications Ordered in Other Encounters  Medication Dose Route Frequency Provider Last Rate Last Dose  .  heparin lock flush 100 unit/mL  500 Units Intravenous Once Curt Bears, MD      . sodium chloride 0.9 % injection 10 mL  10 mL Intravenous PRN Curt Bears, MD        Physical Findings: The patient is in no acute distress. Patient is alert and oriented.  temperature is 98.1 F (36.7 C). His blood pressure is 170/68 and his pulse is 76. His oxygen saturation is 96%. Marland Kitchen  Respiratory effort normal.  No significant changes.  Impression:  The patient is recovering from the effects of radiation.    Plan:  He has a chest CT set up with Dr. Julien Nordmann for follow-up.  I will review his CT and follow-up as needed.  _____________________________________  Sheral Apley Tammi Klippel, M.D.

## 2013-09-15 ENCOUNTER — Ambulatory Visit
Admission: RE | Admit: 2013-09-15 | Discharge: 2013-09-15 | Disposition: A | Discharge status: Home / Self Care | Payer: Medicare Other | Source: Ambulatory Visit | Attending: Radiation Oncology | Admitting: Radiation Oncology

## 2013-09-15 ENCOUNTER — Encounter: Payer: Self-pay | Admitting: Radiation Oncology

## 2013-09-15 VITALS — BP 170/68 | HR 76 | Temp 98.1°F

## 2013-09-15 DIAGNOSIS — C3410 Malignant neoplasm of upper lobe, unspecified bronchus or lung: Secondary | ICD-10-CM

## 2013-09-15 NOTE — Progress Notes (Signed)
Mr. Leverich here for reassessment s/p SBRT to his right upper lobe.  He denies SOB, and pain today.  BP elevated as is documented, but he denies any dizziness.  O2 sat 95% on RA.

## 2013-10-18 ENCOUNTER — Ambulatory Visit (HOSPITAL_COMMUNITY)
Admission: RE | Admit: 2013-10-18 | Discharge: 2013-10-18 | Disposition: A | Discharge status: Home / Self Care | Payer: Medicare Other | Source: Ambulatory Visit | Attending: Internal Medicine | Admitting: Internal Medicine

## 2013-10-18 ENCOUNTER — Encounter (HOSPITAL_COMMUNITY): Payer: Self-pay

## 2013-10-18 ENCOUNTER — Other Ambulatory Visit (HOSPITAL_BASED_OUTPATIENT_CLINIC_OR_DEPARTMENT_OTHER): Payer: Medicare Other

## 2013-10-18 DIAGNOSIS — C3410 Malignant neoplasm of upper lobe, unspecified bronchus or lung: Secondary | ICD-10-CM | POA: Insufficient documentation

## 2013-10-18 DIAGNOSIS — C3490 Malignant neoplasm of unspecified part of unspecified bronchus or lung: Secondary | ICD-10-CM | POA: Diagnosis not present

## 2013-10-18 DIAGNOSIS — J438 Other emphysema: Secondary | ICD-10-CM | POA: Diagnosis not present

## 2013-10-18 LAB — CBC WITH DIFFERENTIAL/PLATELET
BASO%: 0.7 % (ref 0.0–2.0)
Basophils Absolute: 0 10*3/uL (ref 0.0–0.1)
EOS%: 4.1 % (ref 0.0–7.0)
Eosinophils Absolute: 0.3 10*3/uL (ref 0.0–0.5)
HCT: 46.4 % (ref 38.4–49.9)
HGB: 14.9 g/dL (ref 13.0–17.1)
LYMPH%: 26.5 % (ref 14.0–49.0)
MCH: 29.6 pg (ref 27.2–33.4)
MCHC: 32.1 g/dL (ref 32.0–36.0)
MCV: 92.2 fL (ref 79.3–98.0)
MONO#: 0.6 10*3/uL (ref 0.1–0.9)
MONO%: 8.4 % (ref 0.0–14.0)
NEUT#: 4.2 10*3/uL (ref 1.5–6.5)
NEUT%: 60.3 % (ref 39.0–75.0)
Platelets: 162 10*3/uL (ref 140–400)
RBC: 5.03 10*6/uL (ref 4.20–5.82)
RDW: 13.4 % (ref 11.0–14.6)
WBC: 6.9 10*3/uL (ref 4.0–10.3)
lymph#: 1.8 10*3/uL (ref 0.9–3.3)

## 2013-10-18 LAB — COMPREHENSIVE METABOLIC PANEL (CC13)
ALT: 7 U/L (ref 0–55)
AST: 15 U/L (ref 5–34)
Albumin: 3.5 g/dL (ref 3.5–5.0)
Alkaline Phosphatase: 90 U/L (ref 40–150)
Anion Gap: 8 mEq/L (ref 3–11)
BILIRUBIN TOTAL: 0.46 mg/dL (ref 0.20–1.20)
BUN: 10.4 mg/dL (ref 7.0–26.0)
CALCIUM: 9.2 mg/dL (ref 8.4–10.4)
CO2: 33 mEq/L — ABNORMAL HIGH (ref 22–29)
CREATININE: 1 mg/dL (ref 0.7–1.3)
Chloride: 101 mEq/L (ref 98–109)
Glucose: 98 mg/dl (ref 70–140)
Potassium: 4.2 mEq/L (ref 3.5–5.1)
Sodium: 142 mEq/L (ref 136–145)
Total Protein: 7 g/dL (ref 6.4–8.3)

## 2013-10-18 MED ORDER — IOHEXOL 300 MG/ML  SOLN
80.0000 mL | Freq: Once | INTRAMUSCULAR | Status: AC | PRN
  Administered 2013-10-18: 80 mL via INTRAVENOUS

## 2013-10-19 ENCOUNTER — Ambulatory Visit (HOSPITAL_BASED_OUTPATIENT_CLINIC_OR_DEPARTMENT_OTHER): Payer: Medicare Other | Admitting: Internal Medicine

## 2013-10-19 ENCOUNTER — Telehealth: Payer: Self-pay | Admitting: Internal Medicine

## 2013-10-19 ENCOUNTER — Encounter: Payer: Self-pay | Admitting: Internal Medicine

## 2013-10-19 VITALS — BP 128/65 | HR 85 | Temp 98.2°F | Resp 17 | Ht 73.0 in | Wt 148.3 lb

## 2013-10-19 DIAGNOSIS — C3411 Malignant neoplasm of upper lobe, right bronchus or lung: Secondary | ICD-10-CM

## 2013-10-19 DIAGNOSIS — C3410 Malignant neoplasm of upper lobe, unspecified bronchus or lung: Secondary | ICD-10-CM

## 2013-10-19 DIAGNOSIS — I4891 Unspecified atrial fibrillation: Secondary | ICD-10-CM | POA: Diagnosis not present

## 2013-10-19 DIAGNOSIS — I10 Essential (primary) hypertension: Secondary | ICD-10-CM | POA: Diagnosis not present

## 2013-10-19 NOTE — Progress Notes (Signed)
[place] [phone]   Fax:(336) [place], MD [address] [place] [address]  DIAGNOSIS: Unresectable he stage IIB (T3, N0, M0) non-small cell lung cancer, adenocarcinoma with positive EGFR amplification (G598V, G719A) diagnosed in March of 2015.  PRIOR THERAPY: Stereotactic radiotherapy to the right upper lobe pulmonary nodules under the care of Dr. Tammi Klippel completed on 07/26/2013.  CURRENT THERAPY: Observation.  INTERVAL HISTORY: Adam Ward 78 y.o. male returns to the clinic today for followup visit accompanied by his wife. The patient is feeling fine today with no specific complaints except for increasing fatigue and weakness. He tolerated his previous stereotactic radiotherapy fairly well. He denied having any significant chest pain but has shortness of breath with exertion with mild cough with no hemoptysis. The patient denied having any significant weight loss or night sweats. He denied having any fever or chills, no nausea or vomiting. He had repeat CT scan of the chest performed recently and he is here for evaluation and discussion of his scan results.  MEDICAL HISTORY: Past Medical History  Diagnosis Date  . Hypertension   . High cholesterol   . Anxiety   . Rapid atrial fibrillation 07/28/2011    Paroxysmal, newly diagnosed.  . Cavitary lesion of lung 07/28/2011    AFB smears x3 negative.  . Acute respiratory failure with hypoxia 07/26/2011  . Bulla of lung 07/28/2011  . Tobacco abuse 08/01/2011  . COPD (chronic obstructive pulmonary disease)   . Pneumonia   . Numbness and tingling     Hx: of in right hand and B/LLE  . GERD (gastroesophageal reflux disease)     Hx: of  . Headache(784.0)   . Colon cancer 1993  . Lung cancer     ALLERGIES:  is allergic to celebrex and tetracyclines & related.  MEDICATIONS:  Current Outpatient Prescriptions  Medication Sig Dispense Refill  .  alprazolam (XANAX) 2 MG tablet Take 2 mg by mouth 3 (three) times daily.      Marland Kitchen aspirin EC 81 MG tablet Take 81 mg by mouth daily.      . digoxin (LANOXIN) 0.25 MG tablet Take 0.25 mg by mouth daily.      Marland Kitchen diltiazem (CARDIZEM CD) 240 MG 24 hr capsule Take 240 mg by mouth daily.      Marland Kitchen docusate sodium (COLACE) 100 MG capsule Take 100-200 mg by mouth daily.       . Multiple Vitamins-Minerals (MULTIVITAMIN WITH MINERALS) tablet Take 1 tablet by mouth daily.      . Omega-3 Fatty Acids (FISH OIL) 1000 MG CAPS Take 1 capsule by mouth daily.      Marland Kitchen PROAIR HFA 108 (90 BASE) MCG/ACT inhaler Inhale 1 puff into the lungs every 4 (four) hours as needed for wheezing or shortness of breath.       . Saw Palmetto, Serenoa repens, (SAW PALMETTO PO) Take 2 tablets by mouth daily.       . simvastatin (ZOCOR) 20 MG tablet Take 20 mg by mouth daily.       No current facility-administered medications for this visit.   Facility-Administered Medications Ordered in Other Visits  Medication Dose Route Frequency Provider Last Rate Last Dose  . heparin lock flush 100 unit/mL  500 Units Intravenous Once Curt Bears, MD      . sodium chloride 0.9 % injection 10 mL  10 mL Intravenous PRN Curt Bears, MD  SURGICAL HISTORY:  Past Surgical History  Procedure Laterality Date  . Colon surgery    . Cervical disc surgery    . Tonsillectomy    . Appendectomy    . Eye surgery      as a child  . Video bronchoscopy with endobronchial navigation N/A 05/18/2013    Procedure: VIDEO BRONCHOSCOPY WITH ENDOBRONCHIAL NAVIGATION;  Surgeon: [name], MD;  Location: [facility] OR;  Service: Thoracic;  Laterality: N/A;    REVIEW OF SYSTEMS:  Constitutional: positive for fatigue Eyes: negative Ears, nose, mouth, throat, and face: negative Respiratory: positive for dyspnea on exertion Cardiovascular: negative Gastrointestinal: negative Genitourinary:negative Integument/breast: negative Hematologic/lymphatic:  negative Musculoskeletal:negative Neurological: negative Behavioral/Psych: negative Endocrine: negative Allergic/Immunologic: negative   PHYSICAL EXAMINATION: General appearance: alert, cooperative and no distress Head: Normocephalic, without obvious abnormality, atraumatic Neck: no adenopathy, no JVD, supple, symmetrical, trachea midline and thyroid not enlarged, symmetric, no tenderness/mass/nodules Lymph nodes: Cervical, supraclavicular, and axillary nodes normal. Resp: clear to auscultation bilaterally Back: symmetric, no curvature. ROM normal. No CVA tenderness. Cardio: regular rate and rhythm, S1, S2 normal, no murmur, click, rub or gallop GI: soft, non-tender; bowel sounds normal; no masses,  no organomegaly Extremities: extremities normal, atraumatic, no cyanosis or edema Neurologic: Alert and oriented X 3, normal strength and tone. Normal symmetric reflexes. Normal coordination and gait  ECOG PERFORMANCE STATUS: 1 - Symptomatic but completely ambulatory  Blood pressure 128/65, pulse 85, temperature 98.2 F (36.8 C), temperature source Oral, resp. rate 17, height '6\' 1"'  (1.854 m), weight 148 lb 4.8 oz (67.268 kg), SpO2 93.00%.  LABORATORY DATA: Lab Results  Component Value Date   WBC 6.9 10/18/2013   HGB 14.9 10/18/2013   HCT 46.4 10/18/2013   MCV 92.2 10/18/2013   PLT 162 10/18/2013      Chemistry      Component Value Date/Time   NA 142 10/18/2013 1035   NA 137 05/18/2013 0919   K 4.2 10/18/2013 1035   K 4.2 05/18/2013 0919   CL 97 05/18/2013 0919   CO2 33* 10/18/2013 1035   CO2 30 05/18/2013 0919   BUN 10.4 10/18/2013 1035   BUN 9 05/18/2013 0919   CREATININE 1.0 10/18/2013 1035   CREATININE 0.86 05/18/2013 0919   CREATININE 0.88 01/04/2013 1627      Component Value Date/Time   CALCIUM 9.2 10/18/2013 1035   CALCIUM 9.2 05/18/2013 0919   ALKPHOS 90 10/18/2013 1035   ALKPHOS 96 05/18/2013 0919   AST 15 10/18/2013 1035   AST 18 05/18/2013 0919   ALT 7 10/18/2013 1035   ALT 9  05/18/2013 0919   BILITOT 0.46 10/18/2013 1035   BILITOT 0.4 05/18/2013 0919       RADIOGRAPHIC STUDIES: Ct Chest W Contrast  10/18/2013   CLINICAL DATA:  Followup lung carcinoma. Currently undergoing radiation therapy.  EXAM: CT CHEST WITH CONTRAST  TECHNIQUE: Multidetector CT imaging of the chest was performed during intravenous contrast administration.  CONTRAST:  56m OMNIPAQUE IOHEXOL 300 MG/ML  SOLN  COMPARISON:  05/05/2013  FINDINGS: Mediastinum/Hilar Regions: No masses or pathologically enlarged lymph nodes identified.  Other Lymphadenopathy:  None.  Lungs: Mild to moderate emphysema and biapical scarring remains stable.  Two sub solid pulmonary nodules in the right upper lobe both show mild decrease in size, 1 in the anterior right upper lobe now measuring 13 x 11 mm on image 25 compared to 15 x 13 mm previously. The seconds in the lateral right upper lobe currently measures 1.9 x 2.3  cm on image 22 compared to 2.2 x 3.1 cm previously. Another sub solid nodule seen in the anterior left upper lobe on prior exam is nearly completely resolved. This suggests a resolving inflammatory or infectious process.  No solid pulmonary nodules or masses are identified. The no new areas of airspace disease are seen. No evidence of central endobronchial obstruction.  Pleura:  No evidence of effusion or mass.  Vascular/Cardiac: No thoracic aortic aneurysm or other significant abnormality identified.  Musculoskeletal:  No suspicious bone lesions identified.  Other:  None.  IMPRESSION: Mild decrease in bilateral upper lobe sub-solid pulmonary nodules, suggesting resolving inflammatory or infectious process. Continued followup by chest CT recommended in 6 months.  Mild to moderate emphysema.   Electronically Signed   By: Earle Gell M.D.   On: 10/18/2013 14:08   ASSESSMENT AND PLAN: This is a very pleasant 78 years old white male recently diagnosed with unresectable stage IIB non-small cell lung cancer, adenocarcinoma,  with EGFR amplification, with 2 separate lesions in the right upper lobe and questionable lesion in the left upper lobe. He is status post stereotactic radiotherapy to the right lung nodule and the recent CT scan of the chest showed mild decrease in the bilateral upper lobe subsegmental pulmonary nodules. I discussed the scan results with the patient and his wife. I recommended for him to continue on observation with repeat CT scan of the chest without contrast in 4 months. He was advised to call immediately if he has any concerning symptoms in the interval. The patient voices understanding of current disease status and treatment options and is in agreement with the current care plan.  All questions were answered. The patient knows to call the clinic with any problems, questions or concerns. We can certainly see the patient much sooner if necessary.  Disclaimer: This note was dictated with voice recognition software. Similar sounding words can inadvertently be transcribed and may not be corrected upon review.

## 2013-10-19 NOTE — Telephone Encounter (Signed)
gv and printed appt sched and avs for pt for Dec

## 2013-10-20 ENCOUNTER — Encounter: Payer: Self-pay | Admitting: *Deleted

## 2013-10-20 NOTE — [facility] Oncology Navigator Note (Signed)
Late entry:  10/19/13 - Patient at [facility] for follow up appointment with Dr. Julien Nordmann.  Patient accompanied by his wife.  Patient and his wife report that he is doing well.  He reports that his breathing is about the same, his appetite is good and he has regained a little weight.  His wife reports that he walks to the mail box and in the yard around the house every day and tolerates it well.  He does have some swelling in his left ankle which he has reported to his PCP.  He denies any pain in his leg.  Patient denies any questions or concerns at this time.  His wife verified that she has my contact information.  I encouraged them to call me for any questions or needs.

## 2014-01-17 DIAGNOSIS — Z23 Encounter for immunization: Secondary | ICD-10-CM | POA: Diagnosis not present

## 2014-01-24 ENCOUNTER — Encounter: Payer: Self-pay | Admitting: *Deleted

## 2014-01-24 NOTE — [facility] Oncology Navigator Note (Signed)
Called patient to check in and wish him a Happy Birthday.  Patient reports that he is doing well.  He reports that his chest and breathing is doing OK.  We discussed his upcoming appointments in December.  He denies any questions or concerns at this time.  I encouraged him to call me if he has and needs.

## 2014-02-15 ENCOUNTER — Other Ambulatory Visit (HOSPITAL_BASED_OUTPATIENT_CLINIC_OR_DEPARTMENT_OTHER): Payer: Medicare Other

## 2014-02-15 ENCOUNTER — Encounter (HOSPITAL_COMMUNITY): Payer: Self-pay

## 2014-02-15 ENCOUNTER — Ambulatory Visit (HOSPITAL_COMMUNITY)
Admission: RE | Admit: 2014-02-15 | Discharge: 2014-02-15 | Disposition: A | Discharge status: Home / Self Care | Payer: Medicare Other | Source: Ambulatory Visit | Attending: Internal Medicine | Admitting: Internal Medicine

## 2014-02-15 DIAGNOSIS — C3491 Malignant neoplasm of unspecified part of right bronchus or lung: Secondary | ICD-10-CM | POA: Insufficient documentation

## 2014-02-15 DIAGNOSIS — I2510 Atherosclerotic heart disease of native coronary artery without angina pectoris: Secondary | ICD-10-CM | POA: Insufficient documentation

## 2014-02-15 DIAGNOSIS — J432 Centrilobular emphysema: Secondary | ICD-10-CM | POA: Diagnosis not present

## 2014-02-15 DIAGNOSIS — J439 Emphysema, unspecified: Secondary | ICD-10-CM | POA: Insufficient documentation

## 2014-02-15 DIAGNOSIS — C3411 Malignant neoplasm of upper lobe, right bronchus or lung: Secondary | ICD-10-CM

## 2014-02-15 DIAGNOSIS — I7090 Unspecified atherosclerosis: Secondary | ICD-10-CM | POA: Diagnosis not present

## 2014-02-15 LAB — COMPREHENSIVE METABOLIC PANEL (CC13)
ALBUMIN: 3.6 g/dL (ref 3.5–5.0)
ALK PHOS: 111 U/L (ref 40–150)
ALT: 10 U/L (ref 0–55)
ANION GAP: 9 meq/L (ref 3–11)
AST: 18 U/L (ref 5–34)
BILIRUBIN TOTAL: 0.4 mg/dL (ref 0.20–1.20)
BUN: 11.2 mg/dL (ref 7.0–26.0)
CO2: 31 meq/L — AB (ref 22–29)
Calcium: 9 mg/dL (ref 8.4–10.4)
Chloride: 101 mEq/L (ref 98–109)
Creatinine: 1 mg/dL (ref 0.7–1.3)
EGFR: 72 mL/min/{1.73_m2} — ABNORMAL LOW (ref >90)
Glucose: 98 mg/dl (ref 70–140)
Potassium: 3.9 mEq/L (ref 3.5–5.1)
Sodium: 141 mEq/L (ref 136–145)
TOTAL PROTEIN: 6.8 g/dL (ref 6.4–8.3)

## 2014-02-15 LAB — CBC WITH DIFFERENTIAL/PLATELET
BASO%: 0.7 % (ref 0.0–2.0)
Basophils Absolute: 0 10*3/uL (ref 0.0–0.1)
EOS%: 2.8 % (ref 0.0–7.0)
Eosinophils Absolute: 0.2 10*3/uL (ref 0.0–0.5)
HEMATOCRIT: 45.9 % (ref 38.4–49.9)
HEMOGLOBIN: 14.5 g/dL (ref 13.0–17.1)
LYMPH%: 24.3 % (ref 14.0–49.0)
MCH: 29.1 pg (ref 27.2–33.4)
MCHC: 31.5 g/dL — AB (ref 32.0–36.0)
MCV: 92.2 fL (ref 79.3–98.0)
MONO#: 0.6 10*3/uL (ref 0.1–0.9)
MONO%: 8.9 % (ref 0.0–14.0)
NEUT#: 4.3 10*3/uL (ref 1.5–6.5)
NEUT%: 63.3 % (ref 39.0–75.0)
Platelets: 152 10*3/uL (ref 140–400)
RBC: 4.98 10*6/uL (ref 4.20–5.82)
RDW: 13.6 % (ref 11.0–14.6)
WBC: 6.8 10*3/uL (ref 4.0–10.3)
lymph#: 1.7 10*3/uL (ref 0.9–3.3)

## 2014-02-15 MED ORDER — IOHEXOL 300 MG/ML  SOLN
80.0000 mL | Freq: Once | INTRAMUSCULAR | Status: AC | PRN
  Administered 2014-02-15: 80 mL via INTRAVENOUS

## 2014-02-22 ENCOUNTER — Ambulatory Visit (HOSPITAL_BASED_OUTPATIENT_CLINIC_OR_DEPARTMENT_OTHER): Payer: Medicare Other | Admitting: Internal Medicine

## 2014-02-22 ENCOUNTER — Telehealth: Payer: Self-pay | Admitting: Internal Medicine

## 2014-02-22 ENCOUNTER — Encounter: Payer: Self-pay | Admitting: Internal Medicine

## 2014-02-22 VITALS — BP 142/61 | HR 83 | Temp 98.1°F | Resp 17 | Ht 73.0 in | Wt 148.4 lb

## 2014-02-22 DIAGNOSIS — C3411 Malignant neoplasm of upper lobe, right bronchus or lung: Secondary | ICD-10-CM | POA: Diagnosis not present

## 2014-02-22 NOTE — Telephone Encounter (Signed)
Gave avs & cal for June.

## 2014-02-22 NOTE — Progress Notes (Signed)
[place] Telephone:([phone]   Fax:(336) [place], MD [address] [place] [address]  DIAGNOSIS: Unresectable he stage IIB (T3, N0, M0) non-small cell lung cancer, adenocarcinoma with positive EGFR amplification (G598V, G719A) diagnosed in March of 2015.  PRIOR THERAPY: Stereotactic radiotherapy to the right upper lobe pulmonary nodules under the care of Dr. Tammi Klippel completed on 07/26/2013.  CURRENT THERAPY: Observation.  INTERVAL HISTORY: Adam Ward 78 y.o. male returns to the clinic today for 6 months followup visit accompanied by his wife. The patient is feeling fine today with no specific complaints except for weakness. He missed a step at home and fell down recently. He did not have any significant injury.  He denied having any significant chest pain but has shortness of breath with exertion with no hemoptysis. The patient denied having any significant weight loss or night sweats. He denied having any fever or chills, no nausea or vomiting. He had repeat CT scan of the chest performed recently and he is here for evaluation and discussion of his scan results.  MEDICAL HISTORY: Past Medical History  Diagnosis Date  . Hypertension   . High cholesterol   . Anxiety   . Rapid atrial fibrillation 07/28/2011    Paroxysmal, newly diagnosed.  . Cavitary lesion of lung 07/28/2011    AFB smears x3 negative.  . Acute respiratory failure with hypoxia 07/26/2011  . Bulla of lung 07/28/2011  . Tobacco abuse 08/01/2011  . COPD (chronic obstructive pulmonary disease)   . Pneumonia   . Numbness and tingling     Hx: of in right hand and B/LLE  . GERD (gastroesophageal reflux disease)     Hx: of  . Headache(784.0)   . Colon cancer 1993  . Lung cancer     ALLERGIES:  is allergic to celebrex and tetracyclines & related.  MEDICATIONS:  Current Outpatient Prescriptions  Medication Sig Dispense Refill  . alprazolam  (XANAX) 2 MG tablet Take 2 mg by mouth 3 (three) times daily.    Marland Kitchen aspirin EC 81 MG tablet Take 81 mg by mouth daily.    . digoxin (LANOXIN) 0.25 MG tablet Take 0.25 mg by mouth daily.    Marland Kitchen diltiazem (CARDIZEM CD) 240 MG 24 hr capsule Take 240 mg by mouth daily.    Marland Kitchen docusate sodium (COLACE) 100 MG capsule Take 100-200 mg by mouth daily.     . Multiple Vitamins-Minerals (MULTIVITAMIN WITH MINERALS) tablet Take 1 tablet by mouth daily.    . Omega-3 Fatty Acids (FISH OIL) 1000 MG CAPS Take 1 capsule by mouth daily.    Marland Kitchen PROAIR HFA 108 (90 BASE) MCG/ACT inhaler Inhale 1 puff into the lungs every 4 (four) hours as needed for wheezing or shortness of breath.     . Saw Palmetto, Serenoa repens, (SAW PALMETTO PO) Take 2 tablets by mouth daily.     . simvastatin (ZOCOR) 20 MG tablet Take 20 mg by mouth daily.     No current facility-administered medications for this visit.   Facility-Administered Medications Ordered in Other Visits  Medication Dose Route Frequency Provider Last Rate Last Dose  . heparin lock flush 100 unit/mL  500 Units Intravenous Once Curt Bears, MD   500 Units at 06/15/13 1316  . sodium chloride 0.9 % injection 10 mL  10 mL Intravenous PRN Curt Bears, MD   10 mL at 06/15/13 1317    SURGICAL HISTORY:  Past Surgical  History  Procedure Laterality Date  . Colon surgery    . Cervical disc surgery    . Tonsillectomy    . Appendectomy    . Eye surgery      as a child  . Video bronchoscopy with endobronchial navigation N/A 05/18/2013    Procedure: VIDEO BRONCHOSCOPY WITH ENDOBRONCHIAL NAVIGATION;  Surgeon: [name], MD;  Location: [facility] OR;  Service: Thoracic;  Laterality: N/A;    REVIEW OF SYSTEMS:  Constitutional: positive for fatigue Eyes: negative Ears, nose, mouth, throat, and face: negative Respiratory: positive for dyspnea on exertion Cardiovascular: negative Gastrointestinal: negative Genitourinary:negative Integument/breast:  negative Hematologic/lymphatic: negative Musculoskeletal:negative Neurological: negative Behavioral/Psych: negative Endocrine: negative Allergic/Immunologic: negative   PHYSICAL EXAMINATION: General appearance: alert, cooperative and no distress Head: Normocephalic, without obvious abnormality, atraumatic Neck: no adenopathy, no JVD, supple, symmetrical, trachea midline and thyroid not enlarged, symmetric, no tenderness/mass/nodules Lymph nodes: Cervical, supraclavicular, and axillary nodes normal. Resp: clear to auscultation bilaterally Back: symmetric, no curvature. ROM normal. No CVA tenderness. Cardio: regular rate and rhythm, S1, S2 normal, no murmur, click, rub or gallop GI: soft, non-tender; bowel sounds normal; no masses,  no organomegaly Extremities: extremities normal, atraumatic, no cyanosis or edema Neurologic: Alert and oriented X 3, normal strength and tone. Normal symmetric reflexes. Normal coordination and gait  ECOG PERFORMANCE STATUS: 1 - Symptomatic but completely ambulatory  Blood pressure 142/61, pulse 83, temperature 98.1 F (36.7 C), temperature source Oral, resp. rate 17, height '6\' 1"'  (1.854 m), weight 148 lb 6.4 oz (67.314 kg), SpO2 95 %.  LABORATORY DATA: Lab Results  Component Value Date   WBC 6.8 02/15/2014   HGB 14.5 02/15/2014   HCT 45.9 02/15/2014   MCV 92.2 02/15/2014   PLT 152 02/15/2014      Chemistry      Component Value Date/Time   NA 141 02/15/2014 1256   NA 137 05/18/2013 0919   K 3.9 02/15/2014 1256   K 4.2 05/18/2013 0919   CL 97 05/18/2013 0919   CO2 31* 02/15/2014 1256   CO2 30 05/18/2013 0919   BUN 11.2 02/15/2014 1256   BUN 9 05/18/2013 0919   CREATININE 1.0 02/15/2014 1256   CREATININE 0.86 05/18/2013 0919   CREATININE 0.88 01/04/2013 1627      Component Value Date/Time   CALCIUM 9.0 02/15/2014 1256   CALCIUM 9.2 05/18/2013 0919   ALKPHOS 111 02/15/2014 1256   ALKPHOS 96 05/18/2013 0919   AST 18 02/15/2014 1256   AST  18 05/18/2013 0919   ALT 10 02/15/2014 1256   ALT 9 05/18/2013 0919   BILITOT 0.40 02/15/2014 1256   BILITOT 0.4 05/18/2013 0919       RADIOGRAPHIC STUDIES: Ct Chest W Contrast  02/15/2014   CLINICAL DATA:  Followup right lung cancer.  EXAM: CT CHEST WITH CONTRAST  TECHNIQUE: Multidetector CT imaging of the chest was performed during intravenous contrast administration.  CONTRAST:  58m OMNIPAQUE IOHEXOL 300 MG/ML  SOLN  COMPARISON:  10/18/2013  FINDINGS: Mediastinum: Normal heart size. There is no pericardial effusion. Calcified atherosclerotic disease involves the thoracic aorta as well as the LAD coronary artery. The trachea is patent and midline. Normal appearance of the esophagus. No mediastinal or hilar adenopathy.  Lungs/Pleura: No pleural effusion. Moderate upper lobe predominant centrilobular and paraseptal emphysema. Treated right upper lobe lung lesion with surrounding fibrosis/ scar measures 2.3 x 1.1 cm, image 19/ series 4. Previously 2.3 x 1.9 cm. Adjacent nodule measures 10 x 9 mm, image 22/series 5.  Previously 13 x 11 mm. Stable ground-glass attenuating nodules within the posterior right lower lobe are unchanged, image 43/ series 5 and image 46/series 5. Left apical scarring is similar to previous exam  Upper Abdomen: The visualized portions of the liver and spleen are normal. The adrenal glands are both unremarkable. Calcified upper abdominal lymph nodes are noted.  Musculoskeletal: No aggressive lytic or sclerotic bone lesions identified.  IMPRESSION: 1. No acute findings within the chest. 2. Stable to improved appearance of the index lesions within the right upper lobe. No evidence for new or progressive disease identified within the chest. 3. Atherosclerotic disease including coronary artery calcification. 4. Emphysema   Electronically Signed   By: Kerby Moors M.D.   On: 02/15/2014 15:35   ASSESSMENT AND PLAN: This is a very pleasant 78 years old white male recently diagnosed  with unresectable stage IIB non-small cell lung cancer, adenocarcinoma, with EGFR amplification, with 2 separate lesions in the right upper lobe and questionable lesion in the left upper lobe. He is status post stereotactic radiotherapy to the right lung nodule. The recent CT scan of the chest showed no acute finding with stable to improved appearance of the index lesion in the right upper lobe and no evidence for disease progression. I discussed the scan results with the patient and his wife. I recommended for him to continue on observation with repeat CT scan of the chest without contrast in 6 months. He was advised to call immediately if he has any concerning symptoms in the interval. The patient voices understanding of current disease status and treatment options and is in agreement with the current care plan.  All questions were answered. The patient knows to call the clinic with any problems, questions or concerns. We can certainly see the patient much sooner if necessary.  Disclaimer: This note was dictated with voice recognition software. Similar sounding words can inadvertently be transcribed and may not be corrected upon review.

## 2014-04-03 ENCOUNTER — Telehealth: Payer: Self-pay | Admitting: Internal Medicine

## 2014-04-03 NOTE — Telephone Encounter (Signed)
s.w. pt and advised on 6.29 appt moved to 6.28 due to MD on call....pt ok and aware of new d.t

## 2014-04-12 ENCOUNTER — Encounter: Payer: Self-pay | Admitting: *Deleted

## 2014-04-12 NOTE — [facility] Oncology Navigator Note (Signed)
I called patient to check in.  I spoke to both Adam Ward and his wife.  He reports that he is doing well.  He denies cough, heomptysis or any increase in shortness of breath.  I reminded him of his appointments in June.  He and his wife denied any questions or concerns at this time.  I encouraged them to call me for any need.

## 2014-05-25 DIAGNOSIS — Z681 Body mass index (BMI) 19 or less, adult: Secondary | ICD-10-CM | POA: Diagnosis not present

## 2014-05-25 DIAGNOSIS — R7301 Impaired fasting glucose: Secondary | ICD-10-CM | POA: Diagnosis not present

## 2014-05-25 DIAGNOSIS — Z713 Dietary counseling and surveillance: Secondary | ICD-10-CM | POA: Diagnosis not present

## 2014-05-25 DIAGNOSIS — Z125 Encounter for screening for malignant neoplasm of prostate: Secondary | ICD-10-CM | POA: Diagnosis not present

## 2014-05-25 DIAGNOSIS — I10 Essential (primary) hypertension: Secondary | ICD-10-CM | POA: Diagnosis not present

## 2014-05-25 DIAGNOSIS — Z23 Encounter for immunization: Secondary | ICD-10-CM | POA: Diagnosis not present

## 2014-05-25 DIAGNOSIS — E782 Mixed hyperlipidemia: Secondary | ICD-10-CM | POA: Diagnosis not present

## 2014-07-06 DIAGNOSIS — Z681 Body mass index (BMI) 19 or less, adult: Secondary | ICD-10-CM | POA: Diagnosis not present

## 2014-07-06 DIAGNOSIS — E063 Autoimmune thyroiditis: Secondary | ICD-10-CM | POA: Diagnosis not present

## 2014-07-25 ENCOUNTER — Emergency Department (HOSPITAL_COMMUNITY): Payer: Medicare Other

## 2014-07-25 ENCOUNTER — Inpatient Hospital Stay (HOSPITAL_COMMUNITY)
Admission: EM | Admit: 2014-07-25 | Discharge: 2014-07-29 | DRG: 193 | Disposition: A | Discharge status: Home Health | Payer: Medicare Other | Attending: Family Medicine | Admitting: Family Medicine

## 2014-07-25 ENCOUNTER — Encounter (HOSPITAL_COMMUNITY): Payer: Self-pay | Admitting: *Deleted

## 2014-07-25 DIAGNOSIS — J189 Pneumonia, unspecified organism: Principal | ICD-10-CM | POA: Diagnosis present

## 2014-07-25 DIAGNOSIS — R531 Weakness: Secondary | ICD-10-CM | POA: Diagnosis not present

## 2014-07-25 DIAGNOSIS — R7989 Other specified abnormal findings of blood chemistry: Secondary | ICD-10-CM

## 2014-07-25 DIAGNOSIS — J9811 Atelectasis: Secondary | ICD-10-CM | POA: Diagnosis not present

## 2014-07-25 DIAGNOSIS — J9601 Acute respiratory failure with hypoxia: Secondary | ICD-10-CM | POA: Diagnosis present

## 2014-07-25 DIAGNOSIS — Z923 Personal history of irradiation: Secondary | ICD-10-CM

## 2014-07-25 DIAGNOSIS — J449 Chronic obstructive pulmonary disease, unspecified: Secondary | ICD-10-CM | POA: Diagnosis present

## 2014-07-25 DIAGNOSIS — R778 Other specified abnormalities of plasma proteins: Secondary | ICD-10-CM | POA: Diagnosis present

## 2014-07-25 DIAGNOSIS — D696 Thrombocytopenia, unspecified: Secondary | ICD-10-CM

## 2014-07-25 DIAGNOSIS — Z87891 Personal history of nicotine dependence: Secondary | ICD-10-CM

## 2014-07-25 DIAGNOSIS — I480 Paroxysmal atrial fibrillation: Secondary | ICD-10-CM | POA: Diagnosis present

## 2014-07-25 DIAGNOSIS — N289 Disorder of kidney and ureter, unspecified: Secondary | ICD-10-CM | POA: Diagnosis not present

## 2014-07-25 DIAGNOSIS — N179 Acute kidney failure, unspecified: Secondary | ICD-10-CM | POA: Diagnosis not present

## 2014-07-25 DIAGNOSIS — R404 Transient alteration of awareness: Secondary | ICD-10-CM | POA: Diagnosis not present

## 2014-07-25 DIAGNOSIS — Z823 Family history of stroke: Secondary | ICD-10-CM

## 2014-07-25 DIAGNOSIS — E780 Pure hypercholesterolemia: Secondary | ICD-10-CM | POA: Diagnosis present

## 2014-07-25 DIAGNOSIS — R296 Repeated falls: Secondary | ICD-10-CM | POA: Diagnosis present

## 2014-07-25 DIAGNOSIS — C3411 Malignant neoplasm of upper lobe, right bronchus or lung: Secondary | ICD-10-CM | POA: Diagnosis present

## 2014-07-25 DIAGNOSIS — I213 ST elevation (STEMI) myocardial infarction of unspecified site: Secondary | ICD-10-CM | POA: Diagnosis not present

## 2014-07-25 DIAGNOSIS — I10 Essential (primary) hypertension: Secondary | ICD-10-CM | POA: Diagnosis present

## 2014-07-25 DIAGNOSIS — Z8249 Family history of ischemic heart disease and other diseases of the circulatory system: Secondary | ICD-10-CM

## 2014-07-25 DIAGNOSIS — D6959 Other secondary thrombocytopenia: Secondary | ICD-10-CM | POA: Diagnosis not present

## 2014-07-25 DIAGNOSIS — I248 Other forms of acute ischemic heart disease: Secondary | ICD-10-CM | POA: Diagnosis present

## 2014-07-25 DIAGNOSIS — I214 Non-ST elevation (NSTEMI) myocardial infarction: Secondary | ICD-10-CM

## 2014-07-25 DIAGNOSIS — Z85038 Personal history of other malignant neoplasm of large intestine: Secondary | ICD-10-CM

## 2014-07-25 DIAGNOSIS — I4510 Unspecified right bundle-branch block: Secondary | ICD-10-CM | POA: Diagnosis present

## 2014-07-25 DIAGNOSIS — K219 Gastro-esophageal reflux disease without esophagitis: Secondary | ICD-10-CM | POA: Diagnosis present

## 2014-07-25 DIAGNOSIS — C3410 Malignant neoplasm of upper lobe, unspecified bronchus or lung: Secondary | ICD-10-CM | POA: Diagnosis present

## 2014-07-25 DIAGNOSIS — R0600 Dyspnea, unspecified: Secondary | ICD-10-CM | POA: Diagnosis present

## 2014-07-25 DIAGNOSIS — E860 Dehydration: Secondary | ICD-10-CM | POA: Diagnosis present

## 2014-07-25 DIAGNOSIS — R0902 Hypoxemia: Secondary | ICD-10-CM | POA: Diagnosis not present

## 2014-07-25 LAB — URINALYSIS, ROUTINE W REFLEX MICROSCOPIC
Glucose, UA: 100 mg/dL — AB
Leukocytes, UA: NEGATIVE
NITRITE: NEGATIVE
PH: 6 (ref 5.0–8.0)
PROTEIN: 100 mg/dL — AB
Urobilinogen, UA: 1 mg/dL (ref 0.0–1.0)

## 2014-07-25 LAB — CBC WITH DIFFERENTIAL/PLATELET
Basophils Absolute: 0 10*3/uL (ref 0.0–0.1)
Basophils Relative: 0 % (ref 0–1)
EOS ABS: 0 10*3/uL (ref 0.0–0.7)
EOS PCT: 0 % (ref 0–5)
HCT: 38.4 % — ABNORMAL LOW (ref 39.0–52.0)
HEMOGLOBIN: 11.9 g/dL — AB (ref 13.0–17.0)
LYMPHS ABS: 1.1 10*3/uL (ref 0.7–4.0)
LYMPHS PCT: 12 % (ref 12–46)
MCH: 29.2 pg (ref 26.0–34.0)
MCHC: 31 g/dL (ref 30.0–36.0)
MCV: 94.1 fL (ref 78.0–100.0)
MONO ABS: 1 10*3/uL (ref 0.1–1.0)
Monocytes Relative: 10 % (ref 3–12)
NEUTROS ABS: 7.1 10*3/uL (ref 1.7–7.7)
NEUTROS PCT: 78 % — AB (ref 43–77)
PLATELETS: 148 10*3/uL — AB (ref 150–400)
RBC: 4.08 MIL/uL — ABNORMAL LOW (ref 4.22–5.81)
RDW: 13.7 % (ref 11.5–15.5)
WBC: 9.2 10*3/uL (ref 4.0–10.5)

## 2014-07-25 LAB — BASIC METABOLIC PANEL
ANION GAP: 7 (ref 5–15)
BUN: 17 mg/dL (ref 6–20)
CHLORIDE: 101 mmol/L (ref 101–111)
CO2: 32 mmol/L (ref 22–32)
Calcium: 7.7 mg/dL — ABNORMAL LOW (ref 8.9–10.3)
Creatinine, Ser: 1.3 mg/dL — ABNORMAL HIGH (ref 0.61–1.24)
GFR calc Af Amer: 60 mL/min — ABNORMAL LOW (ref >60)
GFR calc non Af Amer: 52 mL/min — ABNORMAL LOW (ref >60)
Glucose, Bld: 183 mg/dL — ABNORMAL HIGH (ref 65–99)
POTASSIUM: 3.7 mmol/L (ref 3.5–5.1)
Sodium: 140 mmol/L (ref 135–145)

## 2014-07-25 LAB — DIGOXIN LEVEL: Digoxin Level: 0.2 ng/mL — ABNORMAL LOW (ref 0.8–2.0)

## 2014-07-25 LAB — URINE MICROSCOPIC-ADD ON

## 2014-07-25 LAB — I-STAT CG4 LACTIC ACID, ED: LACTIC ACID, VENOUS: 0.8 mmol/L (ref 0.5–2.0)

## 2014-07-25 LAB — TROPONIN I: Troponin I: 0.13 ng/mL — ABNORMAL HIGH (ref <0.031)

## 2014-07-25 MED ORDER — ACETAMINOPHEN 325 MG PO TABS
650.0000 mg | ORAL_TABLET | Freq: Once | ORAL | Status: AC
  Administered 2014-07-25: 650 mg via ORAL
  Filled 2014-07-25: qty 2

## 2014-07-25 MED ORDER — DEXTROSE 5 % IV SOLN
1.0000 g | Freq: Once | INTRAVENOUS | Status: AC
  Administered 2014-07-25: 1 g via INTRAVENOUS
  Filled 2014-07-25: qty 10

## 2014-07-25 MED ORDER — SODIUM CHLORIDE 0.9 % IV BOLUS (SEPSIS)
500.0000 mL | Freq: Once | INTRAVENOUS | Status: AC
Start: 2014-07-25 — End: 2014-07-25
  Administered 2014-07-25: 500 mL via INTRAVENOUS

## 2014-07-25 MED ORDER — DEXTROSE 5 % IV SOLN
500.0000 mg | Freq: Once | INTRAVENOUS | Status: AC
  Administered 2014-07-25: 500 mg via INTRAVENOUS
  Filled 2014-07-25: qty 500

## 2014-07-25 NOTE — ED Notes (Addendum)
Progressive weakness today w/fallx x 2. Denies loss of consciousness or hitting head.  Temp per EMS 101.2 and was dosed with '1000mg'$  Tylenol. Has new onset wet, productive cough x today. Hx of lung CA 1 year ago w/3 high dose radiation treatments. No hospitalizations or antibiotics in last 3 months.

## 2014-07-25 NOTE — ED Provider Notes (Signed)
CSN: [id number]     Arrival date & time 07/25/14  2052 History  This chart was scribed for Ripley Fraise, MD by Jeanell Sparrow, ED Scribe. This patient was seen in room APA19/APA19 and the patient's care was started at 9:20 PM.  Chief Complaint  Patient presents with  . Weakness  . Fall   Patient is a 79 y.o. male presenting with weakness and fall. The history is provided by the patient and the spouse. No language interpreter was used.  Weakness This is a new problem. The current episode started 6 to 12 hours ago. The problem has not changed since onset.Pertinent negatives include no chest pain, no headaches and no shortness of breath. Nothing aggravates the symptoms. Nothing relieves the symptoms. He has tried nothing for the symptoms.  Fall This is a recurrent problem. The current episode started 6 to 12 hours ago. Pertinent negatives include no chest pain, no headaches and no shortness of breath.   HPI Comments: Adam Ward is a 79 y.o. male who presents to the Emergency Department complaining of a fall that occurred this morning. Pt reports that he fell twice today due to generalized weakness, with him falling on his right in most recent episode . He denies any LOC or head injury. He states no modifying factors. Pt's wife reports that pt started having a cough today. She denies pt currently being on any chemotherapy. Pt denies any headache, chest pain, SOB, vomiting, diarrhea, neck pain, back pain, or rash.   Past Medical History  Diagnosis Date  . Hypertension   . High cholesterol   . Anxiety   . Rapid atrial fibrillation 07/28/2011    Paroxysmal, newly diagnosed.  . Cavitary lesion of lung 07/28/2011    AFB smears x3 negative.  . Acute respiratory failure with hypoxia 07/26/2011  . Bulla of lung 07/28/2011  . Tobacco abuse 08/01/2011  . COPD (chronic obstructive pulmonary disease)   . Pneumonia   . Numbness and tingling     Hx: of in right hand and B/LLE  . GERD (gastroesophageal  reflux disease)     Hx: of  . Headache(784.0)   . Colon cancer 1993  . Lung cancer    Past Surgical History  Procedure Laterality Date  . Colon surgery    . Cervical disc surgery    . Tonsillectomy    . Appendectomy    . Eye surgery      as a child  . Video bronchoscopy with endobronchial navigation N/A 05/18/2013    Procedure: VIDEO BRONCHOSCOPY WITH ENDOBRONCHIAL NAVIGATION;  Surgeon: [name], MD;  Location: [facility] OR;  Service: Thoracic;  Laterality: N/A;   Family History  Problem Relation Age of Onset  . Heart disease Mother   . Stroke Father   . Pneumonia Sister   . Heart disease Brother    History  Substance Use Topics  . Smoking status: Former Smoker -- 1.00 packs/day for 50 years    Types: Cigarettes    Quit date: 07/16/2011  . Smokeless tobacco: Never Used     Comment: started using electric cigarettes in 06-2010  . Alcohol Use: No    Review of Systems  Respiratory: Positive for cough. Negative for shortness of breath.   Cardiovascular: Negative for chest pain.  Gastrointestinal: Negative for vomiting and diarrhea.  Musculoskeletal: Negative for back pain and neck pain.  Skin: Negative for rash.  Neurological: Positive for weakness. Negative for syncope and headaches.  All other systems reviewed and  are negative.  Allergies  Celebrex and Tetracyclines & related  Home Medications   Prior to Admission medications   Medication Sig Start Date End Date Taking? Authorizing Provider  alprazolam Duanne Moron) 2 MG tablet Take 2 mg by mouth 3 (three) times daily.    Historical Provider, MD  aspirin EC 81 MG tablet Take 81 mg by mouth daily.    Historical Provider, MD  digoxin (LANOXIN) 0.25 MG tablet Take 0.25 mg by mouth daily.    Historical Provider, MD  diltiazem (CARDIZEM CD) 240 MG 24 hr capsule Take 240 mg by mouth daily.    Historical Provider, MD  docusate sodium (COLACE) 100 MG capsule Take 100-200 mg by mouth daily.     Historical Provider, MD  Multiple  Vitamins-Minerals (MULTIVITAMIN WITH MINERALS) tablet Take 1 tablet by mouth daily.    Historical Provider, MD  Omega-3 Fatty Acids (FISH OIL) 1000 MG CAPS Take 1 capsule by mouth daily.    Historical Provider, MD  PROAIR HFA 108 (90 BASE) MCG/ACT inhaler Inhale 1 puff into the lungs every 4 (four) hours as needed for wheezing or shortness of breath.  11/28/11   Historical Provider, MD  simvastatin (ZOCOR) 20 MG tablet Take 20 mg by mouth daily.    Historical Provider, MD   BP 134/47 mmHg  Pulse 92  Temp(Src) 99.5 F (37.5 C) (Oral)  Resp 18  Ht '6\' 2"'$  (1.88 m)  Wt 150 lb (68.04 kg)  BMI 19.25 kg/m2  SpO2 91% Physical Exam  Nursing note and vitals reviewed. CONSTITUTIONAL: Elderly and frail HEAD: Normocephalic/atraumatic EYES: EOMI/PERRL ENMT: Mucous membranes moist NECK: supple no meningeal signs SPINE/BACK:entire spine nontender CV: S1/S2 noted,  LUNGS: Mild tachypnea, decreased breath sounds noted, wet cough noted,  ABDOMEN: soft, nontender, no rebound or guarding, bowel sounds noted throughout abdomen GU:no cva tenderness NEURO: Pt is awake/alert/appropriate, moves all extremitiesx4.  No facial droop.   EXTREMITIES: pulses normal/equal, full ROM, abrasion to right elbow with mild tenderness but no deformity, All extremities/joints palpated/ranged and nontender SKIN: warm, color normal PSYCH: no abnormalities of mood noted, alert and oriented to situation  ED Course  Procedures   CRITICAL CARE Performed by: Sharyon Cable Total critical care time: 31 Critical care time was exclusive of separately billable procedures and treating other patients. Critical care was necessary to treat or prevent imminent or life-threatening deterioration. Critical care was time spent personally by me on the following activities: development of treatment plan with patient and/or surrogate as well as nursing, discussions with consultants, evaluation of patient's response to treatment, examination  of patient, obtaining history from patient or surrogate, ordering and performing treatments and interventions, ordering and review of laboratory studies, ordering and review of radiographic studies, pulse oximetry and re-evaluation of patient's condition. Patient hypoxic on arrival and required oygen sypplementation (pulse ox 81%) and pt required IV fluids and IV antibiotics Pt also with elevated troponin/mild non-stemi  DIAGNOSTIC STUDIES: Oxygen Saturation is 91% on RA, low by my interpretation.    COORDINATION OF CARE: 9:25 PM- Pt advised of plan for treatment which includes radiology and labs and pt agrees.  11:02 PM Clinically patient has pneumonia but CXR does not show large infiltrate He is improving He denies any CP at this time No signs of trauma from recent falls  D/w dr Shanon Brow for admission - if troponin is less than 1 he can be admitted to tele 12:07 AM Repeat troponin 0.16 Will admit to medicine at [facility]  CBC WITH DIFFERENTIAL/PLATELET - Abnormal; Notable for the following:    RBC 4.08 (*)    Hemoglobin 11.9 (*)    HCT 38.4 (*)    Platelets 148 (*)    Neutrophils Relative % 78 (*)    All other components within normal limits  BASIC METABOLIC PANEL - Abnormal; Notable for the following:    Glucose, Bld 183 (*)    Creatinine, Ser 1.30 (*)    Calcium 7.7 (*)    GFR calc non Af Amer 52 (*)    GFR calc Af Amer 60 (*)    All other components within normal limits  URINALYSIS, ROUTINE W REFLEX MICROSCOPIC - Abnormal; Notable for the following:    Specific Gravity, Urine >1.030 (*)    Glucose, UA 100 (*)    Hgb urine dipstick LARGE (*)    Bilirubin Urine SMALL (*)    Ketones, ur TRACE (*)    Protein, ur 100 (*)    All other components within normal limits  TROPONIN I - Abnormal; Notable for the following:    Troponin I 0.13 (*)    All other components within normal limits  DIGOXIN LEVEL - Abnormal; Notable for the following:    Digoxin  Level 0.2 (*)    All other components within normal limits  URINE MICROSCOPIC-ADD ON - Abnormal; Notable for the following:    Squamous Epithelial / LPF FEW (*)    Bacteria, UA MANY (*)    Casts GRANULAR CAST (*)    All other components within normal limits  TROPONIN I - Abnormal; Notable for the following:    Troponin I 0.16 (*)    All other components within normal limits  BRAIN NATRIURETIC PEPTIDE - Abnormal; Notable for the following:    B Natriuretic Peptide 436.0 (*)    All other components within normal limits  CULTURE, BLOOD (ROUTINE X 2)  CULTURE, BLOOD (ROUTINE X 2)  I-STAT CG4 LACTIC ACID, ED  I-STAT CG4 LACTIC ACID, ED    Imaging Review Dg Chest Portable 1 View  07/25/2014   CLINICAL DATA:  Weakness  EXAM: PORTABLE CHEST - 1 VIEW  COMPARISON:  06/01/2013  FINDINGS: Cardiomediastinal silhouette is stable. Hyperinflation again noted. There is streaky scarring in right upper lobe. Metallic fixation plate is noted cervical spine. Mild basilar atelectasis. No segmental infiltrate or pulmonary edema.  IMPRESSION: Hyperinflation. Streaky scarring in right upper lobe. Mild basilar atelectasis. No segmental infiltrate or pulmonary edema.   Electronically Signed   By: Lahoma Crocker M.D.   On: 07/25/2014 21:57     EKG Interpretation   Date/Time:  Tuesday Jul 25 2014 21:11:50 EDT Ventricular Rate:  88 PR Interval:  197 QRS Duration: 123 QT Interval:  400 QTC Calculation: 484 R Axis:   42 Text Interpretation:  Sinus rhythm Right bundle branch block Probable  anterior infarct, old No significant change since last tracing Confirmed  by Christy Gentles  MD, [place] ([id number]) on 07/25/2014 9:40:50 PM     Medications  sodium chloride 0.9 % bolus 500 mL (500 mLs Intravenous New Bag/Given 07/25/14 2229)  cefTRIAXone (ROCEPHIN) 1 g in dextrose 5 % 50 mL IVPB (1 g Intravenous New Bag/Given 07/25/14 2318)  azithromycin (ZITHROMAX) 500 mg in dextrose 5 % 250 mL IVPB (500 mg Intravenous New Bag/Given  07/25/14 2207)  acetaminophen (TYLENOL) tablet 650 mg (650 mg Oral Given 07/25/14 2229)    MDM   Final diagnoses:  CAP (community acquired pneumonia)  Hypoxia  Non-STEMI (non-ST elevated myocardial  infarction)  Renal insufficiency    Nursing notes including past medical history and social history reviewed and considered in documentation xrays/imaging reviewed by myself and considered during evaluation Labs/vital reviewed myself and considered during evaluation Previous records reviewed and considered   I personally performed the services described in this documentation, which was scribed in my presence. The recorded information has been reviewed and is accurate.      Ripley Fraise, MD 07/26/14 [phone]

## 2014-07-25 NOTE — ED Notes (Signed)
Patient placed on [place] O2 at 3 L. SpO2 only rose to 87%. Increased to 4 L w/SpO2 of 90%.

## 2014-07-26 DIAGNOSIS — Z923 Personal history of irradiation: Secondary | ICD-10-CM | POA: Diagnosis not present

## 2014-07-26 DIAGNOSIS — J189 Pneumonia, unspecified organism: Secondary | ICD-10-CM | POA: Diagnosis not present

## 2014-07-26 DIAGNOSIS — Z85038 Personal history of other malignant neoplasm of large intestine: Secondary | ICD-10-CM | POA: Diagnosis not present

## 2014-07-26 DIAGNOSIS — E780 Pure hypercholesterolemia: Secondary | ICD-10-CM | POA: Diagnosis present

## 2014-07-26 DIAGNOSIS — E860 Dehydration: Secondary | ICD-10-CM

## 2014-07-26 DIAGNOSIS — D696 Thrombocytopenia, unspecified: Secondary | ICD-10-CM

## 2014-07-26 DIAGNOSIS — D6959 Other secondary thrombocytopenia: Secondary | ICD-10-CM | POA: Diagnosis present

## 2014-07-26 DIAGNOSIS — J9601 Acute respiratory failure with hypoxia: Secondary | ICD-10-CM

## 2014-07-26 DIAGNOSIS — R531 Weakness: Secondary | ICD-10-CM | POA: Diagnosis not present

## 2014-07-26 DIAGNOSIS — R296 Repeated falls: Secondary | ICD-10-CM | POA: Diagnosis present

## 2014-07-26 DIAGNOSIS — I480 Paroxysmal atrial fibrillation: Secondary | ICD-10-CM | POA: Diagnosis not present

## 2014-07-26 DIAGNOSIS — R7989 Other specified abnormal findings of blood chemistry: Secondary | ICD-10-CM

## 2014-07-26 DIAGNOSIS — Z87891 Personal history of nicotine dependence: Secondary | ICD-10-CM | POA: Diagnosis not present

## 2014-07-26 DIAGNOSIS — Z823 Family history of stroke: Secondary | ICD-10-CM | POA: Diagnosis not present

## 2014-07-26 DIAGNOSIS — C3411 Malignant neoplasm of upper lobe, right bronchus or lung: Secondary | ICD-10-CM | POA: Diagnosis present

## 2014-07-26 DIAGNOSIS — N179 Acute kidney failure, unspecified: Secondary | ICD-10-CM | POA: Diagnosis not present

## 2014-07-26 DIAGNOSIS — K219 Gastro-esophageal reflux disease without esophagitis: Secondary | ICD-10-CM | POA: Diagnosis present

## 2014-07-26 DIAGNOSIS — Z8249 Family history of ischemic heart disease and other diseases of the circulatory system: Secondary | ICD-10-CM | POA: Diagnosis not present

## 2014-07-26 DIAGNOSIS — I248 Other forms of acute ischemic heart disease: Secondary | ICD-10-CM | POA: Diagnosis present

## 2014-07-26 DIAGNOSIS — J449 Chronic obstructive pulmonary disease, unspecified: Secondary | ICD-10-CM | POA: Diagnosis present

## 2014-07-26 DIAGNOSIS — I10 Essential (primary) hypertension: Secondary | ICD-10-CM | POA: Diagnosis present

## 2014-07-26 DIAGNOSIS — R0600 Dyspnea, unspecified: Secondary | ICD-10-CM | POA: Diagnosis not present

## 2014-07-26 LAB — CBC WITH DIFFERENTIAL/PLATELET
BASOS ABS: 0 10*3/uL (ref 0.0–0.1)
Basophils Relative: 0 % (ref 0–1)
EOS PCT: 0 % (ref 0–5)
Eosinophils Absolute: 0 10*3/uL (ref 0.0–0.7)
HCT: 36.9 % — ABNORMAL LOW (ref 39.0–52.0)
Hemoglobin: 11.3 g/dL — ABNORMAL LOW (ref 13.0–17.0)
Lymphocytes Relative: 16 % (ref 12–46)
Lymphs Abs: 1.4 10*3/uL (ref 0.7–4.0)
MCH: 29 pg (ref 26.0–34.0)
MCHC: 30.6 g/dL (ref 30.0–36.0)
MCV: 94.9 fL (ref 78.0–100.0)
Monocytes Absolute: 0.9 10*3/uL (ref 0.1–1.0)
Monocytes Relative: 10 % (ref 3–12)
NEUTROS ABS: 6.4 10*3/uL (ref 1.7–7.7)
NEUTROS PCT: 74 % (ref 43–77)
PLATELETS: 124 10*3/uL — AB (ref 150–400)
RBC: 3.89 MIL/uL — ABNORMAL LOW (ref 4.22–5.81)
RDW: 13.8 % (ref 11.5–15.5)
WBC: 8.6 10*3/uL (ref 4.0–10.5)

## 2014-07-26 LAB — BASIC METABOLIC PANEL
ANION GAP: 6 (ref 5–15)
BUN: 17 mg/dL (ref 6–20)
CALCIUM: 7.3 mg/dL — AB (ref 8.9–10.3)
CHLORIDE: 104 mmol/L (ref 101–111)
CO2: 30 mmol/L (ref 22–32)
CREATININE: 1.14 mg/dL (ref 0.61–1.24)
GFR calc non Af Amer: >60 mL/min (ref >60)
GLUCOSE: 126 mg/dL — AB (ref 65–99)
Potassium: 3.6 mmol/L (ref 3.5–5.1)
Sodium: 140 mmol/L (ref 135–145)

## 2014-07-26 LAB — TROPONIN I
TROPONIN I: 0.13 ng/mL — AB (ref <0.031)
TROPONIN I: 0.07 ng/mL — AB (ref <0.031)
Troponin I: 0.16 ng/mL — ABNORMAL HIGH (ref <0.031)
Troponin I: 0.09 ng/mL — ABNORMAL HIGH (ref <0.031)

## 2014-07-26 LAB — BRAIN NATRIURETIC PEPTIDE: B NATRIURETIC PEPTIDE 5: 436 pg/mL — AB (ref 0.0–100.0)

## 2014-07-26 MED ORDER — DIGOXIN 125 MCG PO TABS
125.0000 ug | ORAL_TABLET | Freq: Every day | ORAL | Status: DC
  Administered 2014-07-26 – 2014-07-29 (×4): 125 ug via ORAL
  Filled 2014-07-26 (×4): qty 1

## 2014-07-26 MED ORDER — ASPIRIN EC 81 MG PO TBEC
81.0000 mg | DELAYED_RELEASE_TABLET | Freq: Every day | ORAL | Status: DC
  Administered 2014-07-26 – 2014-07-29 (×4): 81 mg via ORAL
  Filled 2014-07-26 (×4): qty 1

## 2014-07-26 MED ORDER — SIMVASTATIN 20 MG PO TABS
20.0000 mg | ORAL_TABLET | Freq: Every day | ORAL | Status: DC
  Administered 2014-07-26 – 2014-07-29 (×4): 20 mg via ORAL
  Filled 2014-07-26 (×4): qty 1

## 2014-07-26 MED ORDER — ALBUTEROL SULFATE (2.5 MG/3ML) 0.083% IN NEBU: 3.0000 mL | INHALATION_SOLUTION | RESPIRATORY_TRACT | Status: DC | PRN

## 2014-07-26 MED ORDER — DEXTROSE 5 % IV SOLN
500.0000 mg | INTRAVENOUS | Status: DC
  Administered 2014-07-26: 500 mg via INTRAVENOUS
  Filled 2014-07-26 (×2): qty 500

## 2014-07-26 MED ORDER — LEVALBUTEROL HCL 1.25 MG/0.5ML IN NEBU: 1.2500 mg | INHALATION_SOLUTION | Freq: Four times a day (QID) | RESPIRATORY_TRACT | Status: DC | PRN

## 2014-07-26 MED ORDER — ALPRAZOLAM 1 MG PO TABS
2.0000 mg | ORAL_TABLET | Freq: Three times a day (TID) | ORAL | Status: DC | PRN
  Administered 2014-07-26 (×2): 2 mg via ORAL
  Filled 2014-07-26 (×2): qty 2

## 2014-07-26 MED ORDER — SODIUM CHLORIDE 0.9 % IV SOLN
INTRAVENOUS | Status: DC
  Administered 2014-07-26: 02:00:00 via INTRAVENOUS

## 2014-07-26 MED ORDER — DEXTROSE 5 % IV SOLN
1.0000 g | INTRAVENOUS | Status: DC
  Administered 2014-07-26 – 2014-07-28 (×3): 1 g via INTRAVENOUS
  Filled 2014-07-26 (×5): qty 10

## 2014-07-26 MED ORDER — DILTIAZEM HCL ER COATED BEADS 180 MG PO CP24
180.0000 mg | ORAL_CAPSULE | Freq: Every day | ORAL | Status: DC
  Administered 2014-07-26 – 2014-07-29 (×4): 180 mg via ORAL
  Filled 2014-07-26 (×4): qty 1

## 2014-07-26 MED ORDER — CEFTRIAXONE SODIUM IN DEXTROSE 20 MG/ML IV SOLN
1.0000 g | INTRAVENOUS | Status: DC
  Filled 2014-07-26: qty 50

## 2014-07-26 MED ORDER — CETYLPYRIDINIUM CHLORIDE 0.05 % MT LIQD
7.0000 mL | Freq: Two times a day (BID) | OROMUCOSAL | Status: DC
  Administered 2014-07-26 – 2014-07-29 (×6): 7 mL via OROMUCOSAL

## 2014-07-26 MED ORDER — LEVOTHYROXINE SODIUM 50 MCG PO TABS
50.0000 ug | ORAL_TABLET | Freq: Every day | ORAL | Status: DC
  Administered 2014-07-26 – 2014-07-29 (×4): 50 ug via ORAL
  Filled 2014-07-26 (×4): qty 1

## 2014-07-26 MED ORDER — ALPRAZOLAM 1 MG PO TABS: 2.0000 mg | ORAL_TABLET | Freq: Three times a day (TID) | ORAL | Status: DC

## 2014-07-26 MED ORDER — ENOXAPARIN SODIUM 40 MG/0.4ML ~~LOC~~ SOLN
40.0000 mg | SUBCUTANEOUS | Status: DC
  Administered 2014-07-26 – 2014-07-29 (×4): 40 mg via SUBCUTANEOUS
  Filled 2014-07-26 (×4): qty 0.4

## 2014-07-26 NOTE — Progress Notes (Signed)
ANTIBIOTIC CONSULT NOTE - INITIAL  Pharmacy Consult for Renal Adjustment of ABX as needed (Rocephin and Zithromax)   Indication: rule out pneumonia  Allergies  Allergen Reactions  . Celebrex [Celecoxib] Other (See Comments)    Bothers the patients eyes.  . Tetracyclines & Related Other (See Comments)    Breaks out in mouth.   Patient Measurements: Height: '6\' 2"'$  (188 cm) Weight: 147 lb 9.6 oz (66.951 kg) IBW/kg (Calculated) : 82.2  Vital Signs: Temp: 97.8 F (36.6 C) (05/25 0634) Temp Source: Oral (05/25 0634) BP: 135/50 mmHg (05/25 0634) Pulse Rate: 76 (05/25 0634) Intake/Output from previous day: 05/24 0701 - 05/25 0700 In: -  Out: 500 [Urine:500] Intake/Output from this shift:    Labs:  Recent Labs  07/25/14 2116 07/26/14 0212  WBC 9.2 8.6  HGB 11.9* 11.3*  PLT 148* 124*  CREATININE 1.30* 1.14   Estimated Creatinine Clearance: 52.2 mL/min (by C-G formula based on Cr of 1.14). No results for input(s): VANCOTROUGH, VANCOPEAK, VANCORANDOM, GENTTROUGH, GENTPEAK, GENTRANDOM, TOBRATROUGH, TOBRAPEAK, TOBRARND, AMIKACINPEAK, AMIKACINTROU, AMIKACIN in the last 72 hours.   Microbiology: Recent Results (from the past 720 hour(s))  Blood culture (routine x 2)     Status: None (Preliminary result)   Collection Time: 07/25/14  9:45 PM  Result Value Ref Range Status   Specimen Description BLOOD LEFT HAND  Final   Special Requests BOTTLES DRAWN AEROBIC AND ANAEROBIC 6CC  Final   Culture PENDING  Incomplete   Report Status PENDING  Incomplete  Blood culture (routine x 2)     Status: None (Preliminary result)   Collection Time: 07/25/14  9:48 PM  Result Value Ref Range Status   Specimen Description BLOOD RIGHT FOREARM  Final   Special Requests BOTTLES DRAWN AEROBIC AND ANAEROBIC 6CC  Final   Culture PENDING  Incomplete   Report Status PENDING  Incomplete    Medical History: Past Medical History  Diagnosis Date  . Hypertension   . High cholesterol   . Anxiety   .  Rapid atrial fibrillation 07/28/2011    Paroxysmal, newly diagnosed.  . Cavitary lesion of lung 07/28/2011    AFB smears x3 negative.  . Acute respiratory failure with hypoxia 07/26/2011  . Bulla of lung 07/28/2011  . Tobacco abuse 08/01/2011  . COPD (chronic obstructive pulmonary disease)   . Pneumonia   . Numbness and tingling     Hx: of in right hand and B/LLE  . GERD (gastroesophageal reflux disease)     Hx: of  . Headache(784.0)   . Colon cancer 1993  . Lung cancer    Anti-infectives    Start     Dose/Rate Route Frequency Ordered Stop   07/26/14 2300  cefTRIAXone (ROCEPHIN) 1 g in dextrose 5 % 50 mL IVPB - Premix  Status:  Discontinued     1 g 100 mL/hr over 30 Minutes Intravenous Every 24 hours 07/26/14 0154 07/26/14 1028   07/26/14 2300  cefTRIAXone (ROCEPHIN) 1 g in dextrose 5 % 50 mL IVPB     1 g 100 mL/hr over 30 Minutes Intravenous Every 24 hours 07/26/14 1028     07/26/14 2200  azithromycin (ZITHROMAX) 500 mg in dextrose 5 % 250 mL IVPB     500 mg 250 mL/hr over 60 Minutes Intravenous Every 24 hours 07/26/14 0154 08/02/14 2159   07/25/14 2215  cefTRIAXone (ROCEPHIN) 1 g in dextrose 5 % 50 mL IVPB     1 g 100 mL/hr over 30 Minutes Intravenous  Once 07/25/14 2202 07/26/14 0028   07/25/14 2215  azithromycin (ZITHROMAX) 500 mg in dextrose 5 % 250 mL IVPB     500 mg 250 mL/hr over 60 Minutes Intravenous  Once 07/25/14 2202 07/26/14 0028     Assessment: Summary: 79 year old man with history of COPD, lung cancer presented with history of fever, cough, shortness of breath, generalized weakness with frequent falls. Chest x-ray was unremarkable but clinically patient was felt to have pneumonia, also noted to have acute hypoxic respiratory failure, admitted for in antibiotics and Acutely hypoxic respiratory failure.  Goal of Therapy:  Eradicate infection.  Plan:  Continue Rocephin and Zithromax IV as ordered Switch Zithromax to PO when appropriate Monitor labs, progress,  cultures and sensitivities  Hart Robinsons A 07/26/2014,11:02 AM

## 2014-07-26 NOTE — Care Management Note (Signed)
Case Management Note  Patient Details  Name: Adam Ward MRN: [id number] Date of Birth: Aug 20, 1935  Subjective/Objective:                  Pt admitted from home with pneumonia. Pt lives with his wife and son (who is paraplegic) and will return home at discharge. Pt is independent with ADL's. Pt has a cane and walker for home use.  Action/Plan: Will continue to follow for discharge planning needs.  Expected Discharge Date:                  Expected Discharge Plan:  Home/Self Care  In-House Referral:  NA  Discharge planning Services  CM Consult  Post Acute Care Choice:    Choice offered to:     DME Arranged:    DME Agency:     HH Arranged:    HH Agency:     Status of Service:  In process, will continue to follow  Medicare Important Message Given:    Date Medicare IM Given:    Medicare IM give by:    Date Additional Medicare IM Given:    Additional Medicare Important Message give by:     If discussed at [place] of Stay Meetings, dates discussed:    Additional Comments:  Joylene Draft, RN 07/26/2014, 11:51 AM

## 2014-07-26 NOTE — Progress Notes (Signed)
UR chart review completed.  

## 2014-07-26 NOTE — ED Notes (Signed)
Report given to [facility]

## 2014-07-26 NOTE — Evaluation (Addendum)
Physical Therapy Evaluation Patient Details Name: Adam Ward MRN: [id number] DOB: 07/24/1935 Today's Date: 07/26/2014   History of Present Illness  79 yo male h/o pafib, anxiety, copd, lung cancer being observed, htn comes in with 2 days of fever, cough and worsening sob and generalized weakness with frequent falls. No head injury. Lives with wife. Pt denies any cp or abd pain. Some mild edema in his legs but no significant swelling or redness. No dysuria. Has had a productive cough. Not on freq antibiotics. Pt is mentating normally. On arrival to ED he was hypoxic with o2 sats 81% on RA, does not require oxygen supplementation at home chronically. cxr not impressive, but clinically with pna. Has not eaten or drank much either. Asked to admit for pna with hypoxia and dehydration.  Clinical Impression   Pt was seen for evaluation.  He was up in a chair, very drowsy.  Family present and wife states that pt's falls at home have been associated with this current illness.  Prior to that, he appeared to have some gait instability but had no falls.  Pt has refused to use a walker at home.  He is currently on 3.5 L O2 with O2 sat=96% at rest.  His strength is generally 3+/5 but he is deconditioned.  He has mild ankle edema.  He needs some assist with bed mobility and now needs a walker for gait.  He ambulated 35' with a walker on 4 L O2 with O2 sat=79 after gait.  He was instructed in deep breathing exercise and sat slowly came up to 92%.  He was quite fatigued, so was returned to bed.  I have asked RN to obtain humidification for supplemental O2.  He should be able to transition to home at d/c and should use a walker, at least initially.  I am recommending HHPT at d/c.  Pt is reluctant to have this and thinks that he rehab on his own.    Follow Up Recommendations Home health PT    Equipment Recommendations    none   Recommendations for Other Services   none    Precautions / Restrictions  Precautions Precautions: Fall Restrictions Weight Bearing Restrictions: No      Mobility  Bed Mobility Overal bed mobility: Needs Assistance Bed Mobility: Sit to Supine       Sit to supine: Min assist   General bed mobility comments: assist needed to lift LEs into bed  Transfers Overall transfer level: Needs assistance Equipment used: Rolling walker (2 wheeled) Transfers: Sit to/from Stand Sit to Stand: Supervision         General transfer comment: instructed in technique  Ambulation/Gait Ambulation/Gait assistance: Supervision Ambulation Distance (Feet): 80 Feet Assistive device: Rolling walker (2 wheeled) Gait Pattern/deviations: Trunk flexed   Gait velocity interpretation: at or above normal speed for age/gender General Gait Details: O2 at 4 L O2 during gait                    Balance   Sitting-balance support: No upper extremity supported;Feet supported Sitting balance-Leahy Scale: Good     Standing balance support: No upper extremity supported Standing balance-Leahy Scale: Fair                               Pertinent Vitals/Pain Pain Assessment: No/denies pain    Home Living Family/patient expects to be discharged to:: Private residence Living Arrangements: Spouse/significant other Available Help  at Discharge: Family;Available 24 hours/day Type of Home: House Home Access: Stairs to enter Entrance Stairs-Rails: Right Entrance Stairs-Number of Steps: 3 Home Layout: Two level;Bed/bath upstairs Home Equipment: Walker - 2 wheels;Cane - single point      Prior Function Level of Independence: Independent         Comments: pt admits to being very sedentary             Extremity/Trunk Assessment               Lower Extremity Assessment: Generalized weakness (mild edema both ankles)         Communication   Communication: No difficulties  Cognition Arousal/Alertness: Awake/alert Behavior During Therapy: WFL  for tasks assessed/performed Overall Cognitive Status: Within Functional Limits for tasks assessed                                    Assessment/Plan    PT Assessment Patient needs continued PT services  PT Diagnosis Difficulty walking;Generalized weakness   PT Problem List Decreased strength;Decreased activity tolerance;Decreased balance;Decreased mobility;Decreased knowledge of use of DME;Cardiopulmonary status limiting activity  PT Treatment Interventions Gait training;Functional mobility training;Therapeutic exercise   PT Goals (Current goals can be found in the Care Plan section) Acute Rehab PT Goals Patient Stated Goal: none stated PT Goal Formulation: With patient Time For Goal Achievement: 08/09/14 Potential to Achieve Goals: Good    Frequency Min 3X/week   Barriers to discharge   none-We have discussed the stair situation with bedroom on 2nd floor.  Wife states that pt has had weakness in the past with various illnesses and has been able to negotiate the steps at that time.  He only goes on the steps once a day.                   End of Session Equipment Utilized During Treatment: Gait belt Activity Tolerance: Patient tolerated treatment well Patient left: in bed;with call bell/phone within reach;with chair alarm set Nurse Communication: Mobility status         Time: 1441-1540 PT Time Calculation (min) (ACUTE ONLY): 59 min   Charges:   PT Evaluation $Initial PT Evaluation Tier I: 1 Procedure     PT G CodesOwens Shark, Kamal Jurgens L  PT 07/26/2014, 3:50 PM

## 2014-07-26 NOTE — Progress Notes (Signed)
PROGRESS NOTE  Adam Ward HUD:[id number] DOB: Sep 18, 1935 DOA: 07/25/2014 PCP: Purvis Kilts, MD  Summary: 79 year old man with history of COPD, lung cancer presented with history of fever, cough, shortness of breath, generalized weakness with frequent falls. Chest x-ray was unremarkable but clinically patient was felt to have pneumonia, also noted to have acute hypoxic respiratory failure, admitted for in antibiotics and Acutely hypoxic respiratory failure.  Assessment/Plan: 1. Acute hypoxic respiratory failure secondary to presumed CAP. 2. CAP clinical dx, not seen on chest x-ray. Afebrile, stable hypoxia. 3. Acute kidney injury. Resolved.  4. Mild thrombocytopenia, likely secondary to infeciton. 5. Elevated troponin. Never had chest pain. Minimal elevation trivial; possible mild component of demand ischemia secondary to CAP and hypoxia. Old right bundle branch block. EKG without acute changes. 6. COPD, stable. 7. Generalized weakness with multiple falls prior to admission.  8. Lung cancer, status post radiation treatment. No recent M biotics or hospitalizations. 9. Paroxysmal atrial fibrillation. Currently in sinus rhythm. Digoxin level within normal limits.   Appears stable, plan continue abx, up out of bed, ambulate, wean oxygen as tolerated. Mild troponin elevation does not appear clinically significant.  D/c IVF  CBC in AM  Echocardiogram  PT evaluation  Likely home next 48 hours.  Code Status: full code DVT prophylaxis: Lovenox Family Communication: none present, pt alert and oriented Disposition Plan: pending PT recommendations  Murray Hodgkins, MD  [facility]  Pager [phone] If 7PM-7AM, please contact night-coverage at [email or web address], password [facility] 07/26/2014, 9:41 AM  LOS: 0 days   Consultants:    Procedures:    Antibiotics: Ceftriaxone 5/24 >> Azithromycin 5/24 >>  HPI/Subjective: Feels ok, no SOB, no n/v/abd pain. No chest pain. Never  had chest pain.  Objective: Filed Vitals:   07/26/14 0030 07/26/14 0100 07/26/14 0205 07/26/14 0634  BP: 104/45 107/47 113/41 135/50  Pulse: 71 68 69 76  Temp:   97.8 F (36.6 C) 97.8 F (36.6 C)  TempSrc:   Oral Oral  Resp: '21 19 20 14  '$ Height:      Weight:   66.951 kg (147 lb 9.6 oz)   SpO2: 95% 95% 96% 98%    Intake/Output Summary (Last 24 hours) at 07/26/14 0941 Last data filed at 07/25/14 2222  Gross per 24 hour  Intake      0 ml  Output    500 ml  Net   -500 ml     Filed Weights   07/25/14 2055 07/26/14 0205  Weight: 68.04 kg (150 lb) 66.951 kg (147 lb 9.6 oz)    Exam:     Afebrile, vital signs stable. SPO2 98% on 4 L. General:  Appears calm and comfortable, lying flat in bed Cardiovascular: RRR, no r/g; 2/6 systolic murmur. No LE edema. Telemetry: SR, no arrhythmias  Respiratory: CTA bilaterally, except inspiratory crackles right base. Normal respiratory effort, speaks in full sentences. Abdomen: soft, ntnd Musculoskeletal: grossly normal tone BUE/BLE Psychiatric: grossly normal mood and affect, speech fluent and appropriate. Oriented to name, [facility], 07/2014.  New data reviewed: Creatinine has normalized, 1.14. Troponins have been flat and are now trending down. 0.16  >> 0.13  >> 0.09.  Platelet count trending down, 148  >> 124  Hemoglobin stable 11.3.   Pertinent data since admission:  5/24 chest x-ray: Hyperinflation. Streaky scarring in right upper lobe. Mild basilar atelectasis. No segmental infiltrate or pulmonary edema.  EKG sinus rhythm, right bundle branch block, old.  Pending data: Blood cultures   Scheduled Meds: .  alprazolam  2 mg Oral TID  . antiseptic oral rinse  7 mL Mouth Rinse BID  . aspirin EC  81 mg Oral Daily  . azithromycin  500 mg Intravenous Q24H  . cefTRIAXone (ROCEPHIN)  IV  1 g Intravenous Q24H  . digoxin  125 mcg Oral Daily  . diltiazem  180 mg Oral Daily  . enoxaparin (LOVENOX) injection  40 mg Subcutaneous Q24H  .  levothyroxine  50 mcg Oral QAC breakfast  . simvastatin  20 mg Oral Daily   Continuous Infusions: . sodium chloride 75 mL/hr at 07/26/14 0209    Principal Problem:   Acute respiratory failure with hypoxia Active Problems:   CAP (community acquired pneumonia)   Hypertension   Lung cancer, right  upper lobe   AKI (acute kidney injury)   Paroxysmal a-fib   Elevated troponin   Generalized weakness   RBBB   Thrombocytopenia   Time spent 20 minutes

## 2014-07-26 NOTE — H&P (Signed)
PCP:   Purvis Kilts, MD   Chief Complaint:  weakness  HPI: 79 yo male h/o pafib, anxiety, copd, lung cancer being observed, htn comes in with 2 days of fever, cough and worsening sob and generalized weakness with frequent falls.  No head injury.  Lives with wife.  Pt denies any cp or abd pain.  Some mild edema in his legs but no significant swelling or redness.  No dysuria.  Has had a productive cough.  Not on freq antibiotics.  Pt is mentating normally.  On arrival to ED he was hypoxic with o2 sats 81% on RA, does not require oxygen supplementation at home chronically.  cxr not impressive, but clinically with pna.  Has not eaten or drank much either.  Asked to admit for pna with hypoxia and dehydration.  Review of Systems:  Positive and negative as per HPI otherwise all other systems are negative  Past Medical History: Past Medical History  Diagnosis Date  . Hypertension   . High cholesterol   . Anxiety   . Rapid atrial fibrillation 07/28/2011    Paroxysmal, newly diagnosed.  . Cavitary lesion of lung 07/28/2011    AFB smears x3 negative.  . Acute respiratory failure with hypoxia 07/26/2011  . Bulla of lung 07/28/2011  . Tobacco abuse 08/01/2011  . COPD (chronic obstructive pulmonary disease)   . Pneumonia   . Numbness and tingling     Hx: of in right hand and B/LLE  . GERD (gastroesophageal reflux disease)     Hx: of  . Headache(784.0)   . Colon cancer 1993  . Lung cancer    Past Surgical History  Procedure Laterality Date  . Colon surgery    . Cervical disc surgery    . Tonsillectomy    . Appendectomy    . Eye surgery      as a child  . Video bronchoscopy with endobronchial navigation N/A 05/18/2013    Procedure: VIDEO BRONCHOSCOPY WITH ENDOBRONCHIAL NAVIGATION;  Surgeon: [name], MD;  Location: [facility] OR;  Service: Thoracic;  Laterality: N/A;    Medications: Prior to Admission medications   Medication Sig Start Date End Date Taking? Authorizing Provider   alprazolam Duanne Moron) 2 MG tablet Take 2 mg by mouth 3 (three) times daily.   Yes Historical Provider, MD  aspirin EC 81 MG tablet Take 81 mg by mouth daily.   Yes Historical Provider, MD  CARTIA XT 180 MG 24 hr capsule Take 180 mg by mouth daily. 06/12/14  Yes Historical Provider, MD  digoxin (LANOXIN) 0.125 MG tablet Take 1 tablet by mouth daily. 06/12/14  Yes Historical Provider, MD  docusate sodium (COLACE) 100 MG capsule Take 100-200 mg by mouth daily as needed for mild constipation or moderate constipation.    Yes Historical Provider, MD  levothyroxine (SYNTHROID, LEVOTHROID) 50 MCG tablet Take 50 mcg by mouth daily before breakfast.   Yes Historical Provider, MD  Multiple Vitamins-Minerals (MULTIVITAMIN WITH MINERALS) tablet Take 1 tablet by mouth daily.   Yes Historical Provider, MD  Omega-3 Fatty Acids (FISH OIL) 1000 MG CAPS Take 1 capsule by mouth daily.   Yes Historical Provider, MD  PROAIR HFA 108 (90 BASE) MCG/ACT inhaler Inhale 1 puff into the lungs every 4 (four) hours as needed for wheezing or shortness of breath.  11/28/11  Yes Historical Provider, MD  Saw Palmetto, Serenoa repens, (SAW PALMETTO BERRIES PO) Take 2 capsules by mouth daily.   Yes Historical Provider, MD  simvastatin (ZOCOR)  20 MG tablet Take 20 mg by mouth daily.   Yes Historical Provider, MD    Allergies:   Allergies  Allergen Reactions  . Celebrex [Celecoxib] Other (See Comments)    Bothers the patients eyes.  . Tetracyclines & Related Other (See Comments)    Breaks out in mouth.    Social History:  reports that he quit smoking about 3 years ago. His smoking use included Cigarettes. He has a 50 pack-year smoking history. He has never used smokeless tobacco. He reports that he does not drink alcohol or use illicit drugs.  Family History: Family History  Problem Relation Age of Onset  . Heart disease Mother   . Stroke Father   . Pneumonia Sister   . Heart disease Brother     Physical Exam: Filed  Vitals:   07/25/14 2055 07/25/14 2104 07/25/14 2130 07/25/14 2133  BP: 134/47  103/60   Pulse: 111 92 84   Temp: 99.5 F (37.5 C)   101 F (38.3 C)  TempSrc: Oral   Rectal  Resp: 18  25   Height: '6\' 2"'$  (1.88 m)     Weight: 68.04 kg (150 lb)     SpO2: 81% 91% 94%    General appearance: alert, cooperative and no distress  MMM dry, dehydrated Head: Normocephalic, without obvious abnormality, atraumatic Eyes: negative Nose: Nares normal. Septum midline. Mucosa normal. No drainage or sinus tenderness. Neck: no JVD and supple, symmetrical, trachea midline Lungs: rhonchi bilaterally no wheezes/rales or crackles.  Fair air movement Heart: regular rate and rhythm, S1, S2 normal, no murmur, click, rub or gallop Abdomen: soft, non-tender; bowel sounds normal; no masses,  no organomegaly Extremities: edema trace to 1+ Pulses: 2+ and symmetric Skin: Skin color, texture, turgor normal. No rashes or lesions Neurologic: Grossly normal    Labs on Admission:   Recent Labs  07/25/14 2116  NA 140  K 3.7  CL 101  CO2 32  GLUCOSE 183*  BUN 17  CREATININE 1.30*  CALCIUM 7.7*    Recent Labs  07/25/14 2116  WBC 9.2  NEUTROABS 7.1  HGB 11.9*  HCT 38.4*  MCV 94.1  PLT 148*    Recent Labs  07/25/14 2116 07/25/14 2309  TROPONINI 0.13* 0.16*   Radiological Exams on Admission: Dg Chest Portable 1 View  07/25/2014   CLINICAL DATA:  Weakness  EXAM: PORTABLE CHEST - 1 VIEW  COMPARISON:  06/01/2013  FINDINGS: Cardiomediastinal silhouette is stable. Hyperinflation again noted. There is streaky scarring in right upper lobe. Metallic fixation plate is noted cervical spine. Mild basilar atelectasis. No segmental infiltrate or pulmonary edema.  IMPRESSION: Hyperinflation. Streaky scarring in right upper lobe. Mild basilar atelectasis. No segmental infiltrate or pulmonary edema.   Electronically Signed   By: Lahoma Crocker M.D.   On: 07/25/2014 21:57   12 lead ekg and cxr reviewed by myself Old  records reviewed by myself  Assessment/Plan  79 yo male with hypoxia, sob, fever from CAP  Principal Problem:   CAP (community acquired pneumonia)-  Iv rocephin and azithromycin.  Blood cultures and sputum cultures ordered.  Hypoxic on RA 81%, cont supplemental oxygen prn.  Monitor closely for any worsening of respiratory status.  Not septic.  H/o copd on chart, will hold off on steroids with absence of wheezing and place on xoponex nebs prn.  Active Problems:   Acute respiratory failure with hypoxia-  Secondary to pna   Hypertension-  noted   Lung cancer, right  upper lobe-  Noted, scheduled for ct scan by oncology in June.   AKI (acute kidney injury)-  Mild from dehydration.  ivf overnight, repeat cr in am   Paroxysmal a-fib-  In NSR now, monitor on telemetry, on digoxin level is 0.2, cont home dosing.  Cont cardia also.   Elevated troponin-  Serial trop overnight, may be due to bump in cr.  Follow.   Dyspnea-  Due to above   Generalized weakness- due to infection/dehydration   RBBB-  Old , noted   Dehydration-  ivf  Admit to telemetry bed.  FULL CODE.  dispo 2-3 days depending on how he responds to abx and weans off oxygen supplement.      Adam Ward A 07/26/2014, 12:08 AM

## 2014-07-27 ENCOUNTER — Inpatient Hospital Stay (HOSPITAL_COMMUNITY): Payer: Medicare Other

## 2014-07-27 DIAGNOSIS — R0600 Dyspnea, unspecified: Secondary | ICD-10-CM

## 2014-07-27 LAB — STREP PNEUMONIAE URINARY ANTIGEN: Strep Pneumo Urinary Antigen: NEGATIVE

## 2014-07-27 LAB — CBC
HCT: 39.5 % (ref 39.0–52.0)
HEMOGLOBIN: 11.9 g/dL — AB (ref 13.0–17.0)
MCH: 29 pg (ref 26.0–34.0)
MCHC: 30.1 g/dL (ref 30.0–36.0)
MCV: 96.3 fL (ref 78.0–100.0)
Platelets: 166 10*3/uL (ref 150–400)
RBC: 4.1 MIL/uL — ABNORMAL LOW (ref 4.22–5.81)
RDW: 13.8 % (ref 11.5–15.5)
WBC: 10.1 10*3/uL (ref 4.0–10.5)

## 2014-07-27 MED ORDER — ALPRAZOLAM 1 MG PO TABS
1.0000 mg | ORAL_TABLET | Freq: Two times a day (BID) | ORAL | Status: DC | PRN
  Administered 2014-07-28 (×2): 1 mg via ORAL
  Filled 2014-07-27 (×2): qty 1

## 2014-07-27 MED ORDER — AZITHROMYCIN 250 MG PO TABS
500.0000 mg | ORAL_TABLET | Freq: Every day | ORAL | Status: DC
  Administered 2014-07-27 – 2014-07-28 (×2): 500 mg via ORAL
  Filled 2014-07-27 (×2): qty 2

## 2014-07-27 MED ORDER — PERFLUTREN LIPID MICROSPHERE
1.0000 mL | INTRAVENOUS | Status: DC | PRN
  Administered 2014-07-27: 4 mL via INTRAVENOUS
  Filled 2014-07-27: qty 10

## 2014-07-27 NOTE — Progress Notes (Signed)
PROGRESS NOTE  Adam Ward AYT:[id number] DOB: Jun 16, 1935 DOA: 07/25/2014 PCP: Purvis Kilts, MD  Summary: 79 year old man with history of COPD, lung cancer presented with history of fever, cough, shortness of breath, generalized weakness with frequent falls. Chest x-ray was unremarkable but clinically patient was felt to have pneumonia, also noted to have acute hypoxic respiratory failure, admitted for in antibiotics and acute hypoxic respiratory failure.  Assessment/Plan: 1. Acute hypoxic respiratory failure secondary to presumed CAP. Stable. 2. CAP clinical dx, not seen on chest x-ray. Stable. 3. Acute kidney injury. Resolved.  4. Mild thrombocytopenia, resolved. Likely secondary to infection. 5. Elevated troponin. Never had chest pain. Minimal elevation trivial; possible mild component of demand ischemia secondary to CAP and hypoxia. Old right bundle branch block. EKG without acute changes. No further evaluation suggested. 6. COPD, stable. 7. Generalized weakness with multiple falls prior to admission.  8. Lung cancer, status post radiation treatment. No recent antibiotics or hospitalizations. 9. Paroxysmal atrial fibrillation. Currently in SR.   No significant clinical change, remains hypoxic, significant cough. Plan to continue antibiotics, no major changes today. Wean oxygen as tolerated.  Discussed with wife at bedside.  Code Status: full code DVT prophylaxis: Lovenox Family Communication:  Disposition Plan: Home with home health PT  Murray Hodgkins, MD  [facility]  Pager [phone] If 7PM-7AM, please contact night-coverage at [email or web address], password [facility] 07/27/2014, 1:42 PM  LOS: 1 day   Consultants:  HH: PT  Procedures: Echocardiogram  Impressions:  - Normal LV wall thickness with LVEF 60-65%, no obvious wall motion abnormalities with microbubble contrast. Indeterminate diastolic function. MAC with trivial mitral regurgitation. Sclerotic  aortic valve without stenosis. Mildly dilated right atrium and ventricle. Unable to assess PASP.  Antibiotics: Ceftriaxone 5/24 >> Azithromycin 5/24 >>  HPI/Subjective: Slept poorly, poor appetite today.  Objective: Filed Vitals:   07/26/14 2226 07/27/14 0531 07/27/14 0931 07/27/14 1122  BP: 177/65 183/62    Pulse: 91 100 120 88  Temp: 100.2 F (37.9 C) 99 F (37.2 C)    TempSrc: Oral Oral    Resp: 16 16    Height:      Weight:      SpO2: 94% 95%      Intake/Output Summary (Last 24 hours) at 07/27/14 1342 Last data filed at 07/27/14 0928  Gross per 24 hour  Intake    480 ml  Output    400 ml  Net     80 ml     Filed Weights   07/25/14 2055 07/26/14 0205  Weight: 68.04 kg (150 lb) 66.951 kg (147 lb 9.6 oz)    Exam:    Afebrile, VSS. SPO2 95% 3 L. General:  Appears comfortable, calm. Cardiovascular: Regular rate and rhythm, no murmur, rub or gallop. No lower extremity edema. Telemetry: Sinus rhythm, no arrhythmias  Respiratory: Clear to auscultation bilaterally, no wheezes, rales or rhonchi. Normal respiratory effort. Moist cough. Musculoskeletal: grossly normal tone bilateral upper and lower extremities  New data reviewed: Troponins have been flat and are now trending down. 0.16  >> 0.13  >> 0.09 >> 0.07 Platelets 148  >> 124 >>166 Hemoglobin stable 11.9  Pertinent data since admission:  5/24 chest x-ray: Hyperinflation. Streaky scarring in right upper lobe. Mild basilar atelectasis. No segmental infiltrate or pulmonary edema.  EKG sinus rhythm, right bundle branch block, old.  Pending data: Blood cultures   Scheduled Meds: . antiseptic oral rinse  7 mL Mouth Rinse BID  . aspirin EC  81 mg Oral  Daily  . azithromycin  500 mg Oral QHS  . cefTRIAXone (ROCEPHIN)  IV  1 g Intravenous Q24H  . digoxin  125 mcg Oral Daily  . diltiazem  180 mg Oral Daily  . enoxaparin (LOVENOX) injection  40 mg Subcutaneous Q24H  . levothyroxine  50 mcg Oral QAC  breakfast  . simvastatin  20 mg Oral Daily   Continuous Infusions:    Principal Problem:   Acute respiratory failure with hypoxia Active Problems:   CAP (community acquired pneumonia)   Hypertension   Lung cancer, right  upper lobe   AKI (acute kidney injury)   Paroxysmal a-fib   Elevated troponin   Generalized weakness   RBBB   Thrombocytopenia   Time spent 20 minutes

## 2014-07-27 NOTE — Progress Notes (Signed)
Adam Ward for Renal Adjustment of ABX as needed (Rocephin and Zithromax)   Indication: rule out pneumonia  Allergies  Allergen Reactions  . Celebrex [Celecoxib] Other (See Comments)    Bothers the patients eyes.  . Tetracyclines & Related Other (See Comments)    Breaks out in mouth.   Patient Measurements: Height: '6\' 2"'$  (188 cm) Weight: 147 lb 9.6 oz (66.951 kg) IBW/kg (Calculated) : 82.2  Vital Signs: Temp: 99 F (37.2 C) (05/26 0531) Temp Source: Oral (05/26 0531) BP: 183/62 mmHg (05/26 0531) Pulse Rate: 120 (05/26 0931) Intake/Output from previous day: 05/25 0701 - 05/26 0700 In: 480 [P.O.:480] Out: 1000 [Urine:1000] Intake/Output from this shift: Total I/O In: 240 [P.O.:240] Out: -   Labs:  Recent Labs  07/25/14 2116 07/26/14 0212 07/27/14 0616  WBC 9.2 8.6 10.1  HGB 11.9* 11.3* 11.9*  PLT 148* 124* 166  CREATININE 1.30* 1.14  --    Estimated Creatinine Clearance: 52.2 mL/min (by C-G formula based on Cr of 1.14). No results for input(s): VANCOTROUGH, VANCOPEAK, VANCORANDOM, GENTTROUGH, GENTPEAK, GENTRANDOM, TOBRATROUGH, TOBRAPEAK, TOBRARND, AMIKACINPEAK, AMIKACINTROU, AMIKACIN in the last 72 hours.   Microbiology: Recent Results (from the past 720 hour(s))  Blood culture (routine x 2)     Status: None (Preliminary result)   Collection Time: 07/25/14  9:45 PM  Result Value Ref Range Status   Specimen Description BLOOD LEFT HAND  Final   Special Requests BOTTLES DRAWN AEROBIC AND ANAEROBIC 6CC  Final   Culture NO GROWTH 2 DAYS  Final   Report Status PENDING  Incomplete  Blood culture (routine x 2)     Status: None (Preliminary result)   Collection Time: 07/25/14  9:48 PM  Result Value Ref Range Status   Specimen Description BLOOD RIGHT FOREARM  Final   Special Requests BOTTLES DRAWN AEROBIC AND ANAEROBIC 6CC  Final   Culture NO GROWTH 2 DAYS  Final   Report Status PENDING  Incomplete    Medical History: Past Medical  History  Diagnosis Date  . Hypertension   . High cholesterol   . Anxiety   . Rapid atrial fibrillation 07/28/2011    Paroxysmal, newly diagnosed.  . Cavitary lesion of lung 07/28/2011    AFB smears x3 negative.  . Acute respiratory failure with hypoxia 07/26/2011  . Bulla of lung 07/28/2011  . Tobacco abuse 08/01/2011  . COPD (chronic obstructive pulmonary disease)   . Pneumonia   . Numbness and tingling     Hx: of in right hand and B/LLE  . GERD (gastroesophageal reflux disease)     Hx: of  . Headache(784.0)   . Colon cancer 1993  . Lung cancer    Anti-infectives    Start     Dose/Rate Route Frequency Ordered Stop   07/26/14 2300  cefTRIAXone (ROCEPHIN) 1 g in dextrose 5 % 50 mL IVPB - Premix  Status:  Discontinued     1 g 100 mL/hr over 30 Minutes Intravenous Every 24 hours 07/26/14 0154 07/26/14 1028   07/26/14 2300  cefTRIAXone (ROCEPHIN) 1 g in dextrose 5 % 50 mL IVPB     1 g 100 mL/hr over 30 Minutes Intravenous Every 24 hours 07/26/14 1028     07/26/14 2200  azithromycin (ZITHROMAX) 500 mg in dextrose 5 % 250 mL IVPB     500 mg 250 mL/hr over 60 Minutes Intravenous Every 24 hours 07/26/14 0154 08/02/14 2159   07/25/14 2215  cefTRIAXone (ROCEPHIN) 1 g in dextrose  5 % 50 mL IVPB     1 g 100 mL/hr over 30 Minutes Intravenous  Once 07/25/14 2202 07/26/14 0028   07/25/14 2215  azithromycin (ZITHROMAX) 500 mg in dextrose 5 % 250 mL IVPB     500 mg 250 mL/hr over 60 Minutes Intravenous  Once 07/25/14 2202 07/26/14 0028     Assessment: Summary: 79 year old man with history of COPD, lung cancer presented with history of fever, cough, shortness of breath, generalized weakness with frequent falls. Chest x-ray was unremarkable but clinically patient was felt to have pneumonia, also noted to have acute hypoxic respiratory failure.  He was started on IV Rocephin & Zithromax. No renal dose adjustments required.   Goal of Therapy:  Eradicate infection.  Plan:  Continue Rocephin   Change Zithromax to PO per P&T policy (see below) Duration of therapy per MD- recommend 5-7 days of antibiotics.   Pharmacy to sign off.  Please re-consult as needed.   Taneika Choi, Lavonia Drafts 07/27/2014,10:55 AM   PHARMACIST - PHYSICIAN COMMUNICATION CONCERNING: Antibiotic IV to Oral Route Change Policy  RECOMMENDATION: This patient is receiving Zithromax by the intravenous route.  Based on criteria approved by the Pharmacy and Therapeutics Committee, the antibiotic(s) is/are being converted to the equivalent oral dose form(s).   DESCRIPTION: These criteria include:  Patient being treated for a respiratory tract infection, urinary tract infection, cellulitis or clostridium difficile associated diarrhea if on metronidazole  The patient is not neutropenic and does not exhibit a GI malabsorption state  The patient is eating (either orally or via tube) and/or has been taking other orally administered medications for a least 24 hours  The patient is improving clinically and has a Tmax < 100.5  If you have questions about this conversion, please contact the Pharmacy Department  '[x]'$   [phone] )  [facility] '[]'$   [phone] )  [facility] '[]'$   [phone] )  [facility] '[]'$   [phone] )  [facility] '[]'$   [phone] )  [facility]

## 2014-07-28 NOTE — Care Management Note (Signed)
Case Management Note  Patient Details  Name: Adam Ward MRN: [id number] Date of Birth: 04/19/1935  Subjective/Objective:                    Action/Plan:   Expected Discharge Date:                  Expected Discharge Plan:  Home/Self Care  In-House Referral:  NA  Discharge planning Services  CM Consult  Post Acute Care Choice:  Durable Medical Equipment, Home Health Choice offered to:  Spouse  DME Arranged:  Oxygen DME Agency:  [place]:  PT [facility] Agency:  [place]  Status of Service:  Completed, signed off  Medicare Important Message Given:  Yes Date Medicare IM Given:  07/28/14 Medicare IM give by:  Christinia Gully, RN BSN CM Date Additional Medicare IM Given:    Additional Medicare Important Message give by:     If discussed at [place] of Stay Meetings, dates discussed:    Additional Comments: Pt would like HH PT and home O2 with [facility]. Weekend staff to call and fax orders when written. Christinia Gully [place], RN 07/28/2014, 3:46 PM

## 2014-07-28 NOTE — Progress Notes (Signed)
PROGRESS NOTE  Ronie Barnhart [place]wood QZE:[id number] DOB: 1935/09/01 DOA: 07/25/2014 PCP: Purvis Kilts, MD  Summary: 79 year old man with history of COPD, lung cancer presented with history of fever, cough, shortness of breath, generalized weakness with frequent falls. Chest x-ray was unremarkable but clinically patient was felt to have pneumonia, also noted to have acute hypoxic respiratory failure, admitted for in antibiotics and acute hypoxic respiratory failure.  Assessment/Plan: 1. Acute hypoxic respiratory failure secondary to presumed CAP. Overall improving but still hypoxix 2. CAP clinical dx, not seen on chest x-ray. Rapidly improved 3. Acute kidney injury. Resolved.  4. Mild thrombocytopenia, resolved. Likely secondary to infection. 5. Elevated troponin. Never had chest pain. Minimal elevation trivial; possible mild component of demand ischemia secondary to CAP and hypoxia. Old right bundle branch block. EKG without acute changes. No further evaluation suggested. 6. COPD, stable. 7. Generalized weakness with multiple falls prior to admission.  8. Lung cancer, status post radiation treatment. 9. Paroxysmal atrial fibrillation.    Overall better, plan to transition to oral abx and home with oxygen 5/28.  Discussed with wife at bedside.  Code Status: full code DVT prophylaxis: Lovenox Family Communication:  Disposition Plan: Home with home health PT  Murray Hodgkins, MD  [facility]  Pager [phone] If 7PM-7AM, please contact night-coverage at [email or web address], password [facility] 07/28/2014, 3:40 PM  LOS: 2 days   Consultants:  HH: PT  Procedures: Echocardiogram  Impressions:  - Normal LV wall thickness with LVEF 60-65%, no obvious wall motion abnormalities with microbubble contrast. Indeterminate diastolic function. MAC with trivial mitral regurgitation. Sclerotic aortic valve without stenosis. Mildly dilated right atrium and ventricle. Unable to assess  PASP.  Antibiotics: Ceftriaxone 5/24 >>  Azithromycin 5/24 >> 5/28  HPI/Subjective: Doing well, walked with PT, no n/v. Breathing ok.  Objective: Filed Vitals:   07/27/14 2101 07/28/14 0504 07/28/14 0835 07/28/14 1317  BP: 152/51 187/79  137/51  Pulse: 72 93 135 88  Temp: 98.2 F (36.8 C) 98.5 F (36.9 C)  99.6 F (37.6 C)  TempSrc: Oral Oral  Oral  Resp: '16 18  18  '$ Height:      Weight:      SpO2: 95% 92%  97%    Intake/Output Summary (Last 24 hours) at 07/28/14 1540 Last data filed at 07/28/14 1318  Gross per 24 hour  Intake    940 ml  Output    450 ml  Net    490 ml     Filed Weights   07/25/14 2055 07/26/14 0205  Weight: 68.04 kg (150 lb) 66.951 kg (147 lb 9.6 oz)    Exam:    Afebrile, vital signs stable, stable SPO2 on 3 L General:  Appears calm and comfortable Cardiovascular: RRR, no m/r/g. No LE edema. Telemetry: atrial fibrillation Respiratory: CTA bilaterally, no w/r/r. Normal respiratory effort. Psychiatric: grossly normal mood and affect, speech fluent and appropriate   New data reviewed:   Pertinent data since admission:  5/24 chest x-ray: Hyperinflation. Streaky scarring in right upper lobe. Mild basilar atelectasis. No segmental infiltrate or pulmonary edema.  EKG sinus rhythm, right bundle branch block, old.  Pending data: Blood cultures   Scheduled Meds: . antiseptic oral rinse  7 mL Mouth Rinse BID  . aspirin EC  81 mg Oral Daily  . azithromycin  500 mg Oral QHS  . cefTRIAXone (ROCEPHIN)  IV  1 g Intravenous Q24H  . digoxin  125 mcg Oral Daily  . diltiazem  180 mg Oral Daily  .  enoxaparin (LOVENOX) injection  40 mg Subcutaneous Q24H  . levothyroxine  50 mcg Oral QAC breakfast  . simvastatin  20 mg Oral Daily   Continuous Infusions:    Principal Problem:   Acute respiratory failure with hypoxia Active Problems:   CAP (community acquired pneumonia)   Hypertension   Lung cancer, right  upper lobe   AKI (acute kidney  injury)   Paroxysmal a-fib   Elevated troponin   Generalized weakness   RBBB   Thrombocytopenia   Time spent 15 minutes

## 2014-07-28 NOTE — Progress Notes (Signed)
Physical Therapy Treatment Patient Details Name: Adam Ward MRN: [id number] DOB: 12/31/1935 Today's Date: 07/28/2014    History of Present Illness 79 yo male h/o pafib, anxiety, copd, lung cancer being observed, htn comes in with 2 days of fever, cough and worsening sob and generalized weakness with frequent falls. No head injury. Lives with wife. Pt denies any cp or abd pain. Some mild edema in his legs but no significant swelling or redness. No dysuria. Has had a productive cough. Not on freq antibiotics. Pt is mentating normally. On arrival to ED he was hypoxic with o2 sats 81% on RA, does not require oxygen supplementation at home chronically. cxr not impressive, but clinically with pna. Has not eaten or drank much either. Asked to admit for pna with hypoxia and dehydration.    PT Comments     Pt refused PT this morning but agreeable this afternoon.  He remains on 3.5 L O2 with resting O2 sat=93%.  He was able to participate in supine strengthening exercise with no drop in O2 sat.  He is able to stand at bedside with no assist and was able to ambulate 200' with a walker with good stability.  O2 had been increased to 5 L/min for gait but O2 sat dropped to 88%.  It rebounded slowly to 92% with rest.  He continues to need a walker for gait and I still recommend HHPT at d/c.  Follow Up Recommendations  Home health PT     Equipment Recommendations  None recommended by PT    Recommendations for Other Services  none     Precautions / Restrictions Precautions Precautions: Fall Restrictions Weight Bearing Restrictions: No    Mobility  Bed Mobility Overal bed mobility: Needs Assistance Bed Mobility: Supine to Sit     Supine to sit: Supervision        Transfers Overall transfer level: Needs assistance Equipment used: Rolling walker (2 wheeled) Transfers: Sit to/from Stand Sit to Stand: Supervision            Ambulation/Gait Ambulation/Gait assistance:  Supervision Ambulation Distance (Feet): 200 Feet Assistive device: Rolling walker (2 wheeled) Gait Pattern/deviations: Trunk flexed Gait velocity: pt instructed to slow his pace if fatigued Gait velocity interpretation: at or above normal speed for age/gender General Gait Details: O2 increased from 4 L/min to 5 L/min for gait...O2 sat had been at 93% pre gait and dropped to 88% post gait                        Balance   Sitting-balance support: No upper extremity supported;Feet supported Sitting balance-Leahy Scale: Good       Standing balance-Leahy Scale: Fair                      Cognition Arousal/Alertness: Awake/alert Behavior During Therapy: WFL for tasks assessed/performed Overall Cognitive Status: Within Functional Limits for tasks assessed                      Exercises General Exercises - Lower Extremity Ankle Circles/Pumps: AROM;Both;10 reps;Supine Short Arc Quad: AROM;Both;10 reps;Supine Heel Slides: AROM;Both;10 reps;Supine Straight Leg Raises: AROM;Both;5 reps;Supine Other Exercises Other Exercises: bridging x 5 repetitions            Pertinent Vitals/Pain Pain Assessment: No/denies pain  PT Goals (current goals can now be found in the care plan section) Progress towards PT goals: Progressing toward goals    Frequency  Min 3X/week    PT Plan Current plan remains appropriate                 End of Session Equipment Utilized During Treatment: Gait belt Activity Tolerance: Patient tolerated treatment well Patient left: in chair;with call bell/phone within reach;with chair alarm set;with family/visitor present     Time: 4388-8757 PT Time Calculation (min) (ACUTE ONLY): 44 min  Charges:  $Gait Training: 8-22 mins $Therapeutic Exercise: 8-22 mins                    G CodesSable Feil  PT 07/28/2014, 3:55 PM [phone]

## 2014-07-29 DIAGNOSIS — I480 Paroxysmal atrial fibrillation: Secondary | ICD-10-CM

## 2014-07-29 MED ORDER — CEFUROXIME AXETIL 500 MG PO TABS: 500.0000 mg | ORAL_TABLET | Freq: Two times a day (BID) | ORAL | Status: DC

## 2014-07-29 NOTE — Discharge Summary (Signed)
Physician Discharge Summary  Adam Ward LZJ:[id number] DOB: Nov 02, 1935 DOA: 07/25/2014  PCP: Adam Ward  Admit date: 07/25/2014 Discharge date: 07/29/2014  Recommendations for Outpatient Follow-up:  1. Acute hypoxic respiratory failure secondary to pneumonia. Going home on oxygen. Hopefully this can be weaned off in the next few weeks. 2. Paroxysmal atrial fibrillation. Back in atrial fibrillation. Could consider anticoagulation although in the past aspirin has been recommended by cardiology. The patient is due for his 1 year recheck with cardiology and I sent a message to his cardiology team to help coordinate his follow-up with any further recommendations. I discussed this in detail with his wife.   Follow-up Information    Follow up with Adam Ward.   Contact information:   [address] [place] [address] [phone]       Follow up with Adam Ward. Schedule an appointment as soon as possible for a visit in 2 weeks.   Specialty:  Family Medicine   Contact information:   [address] [place] [address] [phone]      Discharge Diagnoses:  1. Acute hypoxic respiratory failure 2. Community acquired pneumonia 3. Acute kidney injury 4. Mild thrombocytopenia 5. Paroxysmal atrial fibrillation 6. Elevated troponin, possible demand ischemia 7. COPD 8. Generalized weakness with multiple falls prior to admission 9. Lung cancer  Discharge Condition: improved Disposition: home with HHPT  Diet recommendation: heart healthy  Filed Weights   07/25/14 2055 07/26/14 0205 07/29/14 0524  Weight: 68.04 kg (150 lb) 66.951 kg (147 lb 9.6 oz) 68.901 kg (151 lb 14.4 oz)    History of present illness:  79 year old man with history of COPD, lung cancer presented with history of fever, cough, shortness of breath, generalized weakness with frequent falls. Chest x-ray was unremarkable but clinically patient was felt to  have pneumonia, also noted to have acute hypoxic respiratory failure, admitted for in antibiotics and acute hypoxic respiratory failure.  Hospital Course:  [name] was treated with empiric antibiotics and supplemental oxygen with rapid improvement in his overall clinical condition. Plan for discharge home on antibiotics and supplemental oxygen which can hopefully be weaned in the near future. He had no cardiac symptoms. He does have a history of paroxysmal atrial fibrillation, last noted to be in sinus rhythm one year ago at cardiology office. He did develop atrial fibrillation during this hospitalization but rate has been controlled current agents. As he was previously on aspirin, I think it is reasonable to defer the question of anticoagulation to cardiology in the near future.  1. Acute hypoxic respiratory failure secondary to presumed CAP. Stable.  2. CAP clinical dx, not seen on chest x-ray. Continues to improved. 3. Acute kidney injury. Resolved.  4. Mild thrombocytopenia, resolved. Likely secondary to infection. 5. Elevated troponin. Never had chest pain. Minimal elevation trivial; possible mild component of demand ischemia secondary to CAP and hypoxia. Old right bundle branch block. EKG without acute changes. No further evaluation suggested. 6. COPD, has remained stable. 7. Generalized weakness with multiple falls prior to admission. Doing well with physical therapy. 8. Lung cancer, status post radiation treatment. 9. Paroxysmal atrial fibrillation.   Doing well  Stable for discharge on oral abx, home oxygen for hypoxia  Discussed with wife, f/u with PCP 2 weeks and cardiology--message sent  Consultants:  HH: PT  Procedures: Echocardiogram  Impressions:  - Normal LV wall thickness with LVEF 60-65%, no obvious wall motion abnormalities with microbubble contrast. Indeterminate diastolic function. MAC  with trivial mitral regurgitation. Sclerotic aortic valve without  stenosis. Mildly dilated right atrium and ventricle. Unable to assess PASP.  Antibiotics: Ceftriaxone 5/24 >> 5/27 Ceftin 5/28 >> 5/30 Azithromycin 5/24 >> 5/28  Discharge Instructions  Discharge Instructions    Diet - low sodium heart healthy    Complete by:  As directed      Discharge instructions    Complete by:  As directed   Call your physician or seek immediate medical attention for pain, fever, shortness of breath, weakness or worsening of condition.     Increase activity slowly    Complete by:  As directed           Current Discharge Medication List    START taking these medications   Details  cefUROXime (CEFTIN) 500 MG tablet Take 1 tablet (500 mg total) by mouth 2 (two) times daily with a meal. Qty: 6 tablet, Refills: 0      CONTINUE these medications which have NOT CHANGED   Details  alprazolam (XANAX) 2 MG tablet Take 2 mg by mouth 3 (three) times daily.    aspirin EC 81 MG tablet Take 81 mg by mouth daily.    CARTIA XT 180 MG 24 hr capsule Take 180 mg by mouth daily.    digoxin (LANOXIN) 0.125 MG tablet Take 1 tablet by mouth daily.    docusate sodium (COLACE) 100 MG capsule Take 100-200 mg by mouth daily as needed for mild constipation or moderate constipation.     levothyroxine (SYNTHROID, LEVOTHROID) 50 MCG tablet Take 50 mcg by mouth daily before breakfast.    Multiple Vitamins-Minerals (MULTIVITAMIN WITH MINERALS) tablet Take 1 tablet by mouth daily.    Omega-3 Fatty Acids (FISH OIL) 1000 MG CAPS Take 1 capsule by mouth daily.    PROAIR HFA 108 (90 BASE) MCG/ACT inhaler Inhale 1 puff into the lungs every 4 (four) hours as needed for wheezing or shortness of breath.     Saw Palmetto, Serenoa repens, (SAW PALMETTO BERRIES PO) Take 2 capsules by mouth daily.    simvastatin (ZOCOR) 20 MG tablet Take 20 mg by mouth daily.       Allergies  Allergen Reactions  . Celebrex [Celecoxib] Other (See Comments)    Bothers the patients eyes.  .  Tetracyclines & Related Other (See Comments)    Breaks out in mouth.    The results of significant diagnostics from this hospitalization (including imaging, microbiology, ancillary and laboratory) are listed below for reference.    Significant Diagnostic Studies: Dg Chest Portable 1 View  07/25/2014   CLINICAL DATA:  Weakness  EXAM: PORTABLE CHEST - 1 VIEW  COMPARISON:  06/01/2013  FINDINGS: Cardiomediastinal silhouette is stable. Hyperinflation again noted. There is streaky scarring in right upper lobe. Metallic fixation plate is noted cervical spine. Mild basilar atelectasis. No segmental infiltrate or pulmonary edema.  IMPRESSION: Hyperinflation. Streaky scarring in right upper lobe. Mild basilar atelectasis. No segmental infiltrate or pulmonary edema.   Electronically Signed   By: Lahoma Crocker M.D.   On: 07/25/2014 21:57    Microbiology: Recent Results (from the past 240 hour(s))  Blood culture (routine x 2)     Status: None (Preliminary result)   Collection Time: 07/25/14  9:45 PM  Result Value Ref Range Status   Specimen Description BLOOD LEFT HAND  Final   Special Requests BOTTLES DRAWN AEROBIC AND ANAEROBIC 6CC  Final   Culture NO GROWTH 4 DAYS  Final   Report Status PENDING  Incomplete  Blood culture (routine x 2)     Status: None (Preliminary result)   Collection Time: 07/25/14  9:48 PM  Result Value Ref Range Status   Specimen Description BLOOD RIGHT FOREARM  Final   Special Requests BOTTLES DRAWN AEROBIC AND ANAEROBIC 6CC  Final   Culture NO GROWTH 4 DAYS  Final   Report Status PENDING  Incomplete     Labs: Basic Metabolic Panel:  Recent Labs Lab 07/25/14 2116 07/26/14 0212  NA 140 140  K 3.7 3.6  CL 101 104  CO2 32 30  GLUCOSE 183* 126*  BUN 17 17  CREATININE 1.30* 1.14  CALCIUM 7.7* 7.3*   CBC:  Recent Labs Lab 07/25/14 2116 07/26/14 0212 07/27/14 0616  WBC 9.2 8.6 10.1  NEUTROABS 7.1 6.4  --   HGB 11.9* 11.3* 11.9*  HCT 38.4* 36.9* 39.5  MCV 94.1  94.9 96.3  PLT 148* 124* 166   Cardiac Enzymes:  Recent Labs Lab 07/25/14 2116 07/25/14 2309 07/26/14 0212 07/26/14 0731 07/26/14 1344  TROPONINI 0.13* 0.16* 0.13* 0.09* 0.07*     Recent Labs  07/25/14 2309  BNP 436.0*    Principal Problem:   Acute respiratory failure with hypoxia Active Problems:   CAP (community acquired pneumonia)   Hypertension   Lung cancer, right  upper lobe   AKI (acute kidney injury)   Paroxysmal a-fib   Elevated troponin   Generalized weakness   RBBB   Thrombocytopenia   Time coordinating discharge: 35 minutes  Signed:  Murray Hodgkins, Ward [facility] 07/29/2014, 1:07 PM

## 2014-07-29 NOTE — Progress Notes (Signed)
Attempted to contact [place] about patients home O2 at this time. Unable to get in touch with anyone. Will let night shift RN know and patient and wife.

## 2014-07-29 NOTE — Progress Notes (Addendum)
PROGRESS NOTE  Adam Ward ZOX:[id number] DOB: 03-23-1935 DOA: 07/25/2014 PCP: Purvis Kilts, MD  Summary: 79 year old man with history of COPD, lung cancer presented with history of fever, cough, shortness of breath, generalized weakness with frequent falls. Chest x-ray was unremarkable but clinically patient was felt to have pneumonia, also noted to have acute hypoxic respiratory failure, admitted for in antibiotics and acute hypoxic respiratory failure.  Assessment/Plan: 1. Acute hypoxic respiratory failure secondary to presumed CAP. Stable.  2. CAP clinical dx, not seen on chest x-ray. Continues to improved. 3. Acute kidney injury. Resolved.  4. Mild thrombocytopenia, resolved. Likely secondary to infection. 5. Elevated troponin. Never had chest pain. Minimal elevation trivial; possible mild component of demand ischemia secondary to CAP and hypoxia. Old right bundle branch block. EKG without acute changes. No further evaluation suggested. 6. COPD, has remained stable. 7. Generalized weakness with multiple falls prior to admission. Doing well with physical therapy. 8. Lung cancer, status post radiation treatment. 9. Paroxysmal atrial fibrillation.    Doing well  Stable for discharge on oral abx, home oxygen for hypoxia  Discussed with wife, f/u with PCP 2 weeks and cardiology--message sent   Murray Hodgkins, MD  [facility]  Pager [phone] If 7PM-7AM, please contact night-coverage at [email or web address], password [facility] 07/29/2014, 12:37 PM  LOS: 3 days   Consultants:  HH: PT  Procedures: Echocardiogram  Impressions:  - Normal LV wall thickness with LVEF 60-65%, no obvious wall motion abnormalities with microbubble contrast. Indeterminate diastolic function. MAC with trivial mitral regurgitation. Sclerotic aortic valve without stenosis. Mildly dilated right atrium and ventricle. Unable to assess PASP.  Antibiotics: Ceftriaxone 5/24 >> 5/27 Ceftin 5/28  >> 5/30 Azithromycin 5/24 >> 5/28  HPI/Subjective: Feeling better, no n/v/abd pain. Eating well. Breathing well. No pain.  Objective: Filed Vitals:   07/29/14 0433 07/29/14 0524 07/29/14 0807 07/29/14 1007  BP: 171/70     Pulse: 96   112  Temp: 99.5 F (37.5 C)     TempSrc: Oral     Resp: 17     Height:      Weight:  68.901 kg (151 lb 14.4 oz)    SpO2: 97%  97%     Intake/Output Summary (Last 24 hours) at 07/29/14 1237 Last data filed at 07/29/14 1000  Gross per 24 hour  Intake    720 ml  Output    950 ml  Net   -230 ml     Filed Weights   07/25/14 2055 07/26/14 0205 07/29/14 0524  Weight: 68.04 kg (150 lb) 66.951 kg (147 lb 9.6 oz) 68.901 kg (151 lb 14.4 oz)    Exam:    General:  Appears comfortable, calm sitting in chair. Cardiovascular: irregular, normal rate, no murmur, rub or gallop. 1+ bilateral lower extremity edema. Telemetry: atrial fibrillation, no arrhythmias  Respiratory: Clear to auscultation bilaterally, no wheezes, rales or rhonchi. Normal respiratory effort. Musculoskeletal: grossly normal tone bilateral upper and lower extremities Psychiatric: grossly normal mood and affect, speech fluent and appropriate  New data reviewed:   Pertinent data since admission:  5/24 chest x-ray: Hyperinflation. Streaky scarring in right upper lobe. Mild basilar atelectasis. No segmental infiltrate or pulmonary edema.  EKG sinus rhythm, right bundle branch block, old.  Pending data: Blood cultures no growth 4 days  Scheduled Meds: . antiseptic oral rinse  7 mL Mouth Rinse BID  . aspirin EC  81 mg Oral Daily  . azithromycin  500 mg Oral QHS  . cefTRIAXone (ROCEPHIN)  IV  1 g Intravenous Q24H  . digoxin  125 mcg Oral Daily  . diltiazem  180 mg Oral Daily  . enoxaparin (LOVENOX) injection  40 mg Subcutaneous Q24H  . levothyroxine  50 mcg Oral QAC breakfast  . simvastatin  20 mg Oral Daily   Continuous Infusions:    Principal Problem:   Acute respiratory  failure with hypoxia Active Problems:   CAP (community acquired pneumonia)   Hypertension   Lung cancer, right  upper lobe   AKI (acute kidney injury)   Paroxysmal a-fib   Elevated troponin   Generalized weakness   RBBB   Thrombocytopenia

## 2014-07-29 NOTE — Progress Notes (Signed)
Patient being d/c home with home 02. IV cath removed and intact. No c/o pain at this time. No s/s of distress. Family at bedside.

## 2014-07-29 NOTE — Progress Notes (Signed)
Patients home health orders were faxed over to [place]. Spoke with Manuela Schwartz, RN and she stated they had received patients home health orders for PT and home O2. Home O2 transporter called and stated they would have O2 hear anytime from 1700-2100 tonight. Wife and patient were made aware. Will continue to monitor patient at this time.

## 2014-07-30 DIAGNOSIS — I10 Essential (primary) hypertension: Secondary | ICD-10-CM | POA: Diagnosis not present

## 2014-07-30 DIAGNOSIS — J189 Pneumonia, unspecified organism: Secondary | ICD-10-CM | POA: Diagnosis not present

## 2014-07-30 DIAGNOSIS — E785 Hyperlipidemia, unspecified: Secondary | ICD-10-CM | POA: Diagnosis not present

## 2014-07-30 DIAGNOSIS — Z87891 Personal history of nicotine dependence: Secondary | ICD-10-CM | POA: Diagnosis not present

## 2014-07-30 DIAGNOSIS — J440 Chronic obstructive pulmonary disease with acute lower respiratory infection: Secondary | ICD-10-CM | POA: Diagnosis not present

## 2014-07-30 DIAGNOSIS — K219 Gastro-esophageal reflux disease without esophagitis: Secondary | ICD-10-CM | POA: Diagnosis not present

## 2014-07-30 DIAGNOSIS — I480 Paroxysmal atrial fibrillation: Secondary | ICD-10-CM | POA: Diagnosis not present

## 2014-07-30 DIAGNOSIS — F419 Anxiety disorder, unspecified: Secondary | ICD-10-CM | POA: Diagnosis not present

## 2014-07-30 DIAGNOSIS — Z85118 Personal history of other malignant neoplasm of bronchus and lung: Secondary | ICD-10-CM | POA: Diagnosis not present

## 2014-07-30 DIAGNOSIS — J449 Chronic obstructive pulmonary disease, unspecified: Secondary | ICD-10-CM | POA: Diagnosis not present

## 2014-07-30 LAB — CULTURE, BLOOD (ROUTINE X 2)
CULTURE: NO GROWTH
Culture: NO GROWTH

## 2014-07-31 LAB — LEGIONELLA ANTIGEN, URINE

## 2014-08-01 DIAGNOSIS — J189 Pneumonia, unspecified organism: Secondary | ICD-10-CM | POA: Diagnosis not present

## 2014-08-01 DIAGNOSIS — K219 Gastro-esophageal reflux disease without esophagitis: Secondary | ICD-10-CM | POA: Diagnosis not present

## 2014-08-01 DIAGNOSIS — I10 Essential (primary) hypertension: Secondary | ICD-10-CM | POA: Diagnosis not present

## 2014-08-01 DIAGNOSIS — F419 Anxiety disorder, unspecified: Secondary | ICD-10-CM | POA: Diagnosis not present

## 2014-08-01 DIAGNOSIS — I480 Paroxysmal atrial fibrillation: Secondary | ICD-10-CM | POA: Diagnosis not present

## 2014-08-01 DIAGNOSIS — J440 Chronic obstructive pulmonary disease with acute lower respiratory infection: Secondary | ICD-10-CM | POA: Diagnosis not present

## 2014-08-02 DIAGNOSIS — I480 Paroxysmal atrial fibrillation: Secondary | ICD-10-CM | POA: Diagnosis not present

## 2014-08-02 DIAGNOSIS — J189 Pneumonia, unspecified organism: Secondary | ICD-10-CM | POA: Diagnosis not present

## 2014-08-02 DIAGNOSIS — F419 Anxiety disorder, unspecified: Secondary | ICD-10-CM | POA: Diagnosis not present

## 2014-08-02 DIAGNOSIS — I10 Essential (primary) hypertension: Secondary | ICD-10-CM | POA: Diagnosis not present

## 2014-08-02 DIAGNOSIS — J440 Chronic obstructive pulmonary disease with acute lower respiratory infection: Secondary | ICD-10-CM | POA: Diagnosis not present

## 2014-08-02 DIAGNOSIS — K219 Gastro-esophageal reflux disease without esophagitis: Secondary | ICD-10-CM | POA: Diagnosis not present

## 2014-08-03 DIAGNOSIS — I480 Paroxysmal atrial fibrillation: Secondary | ICD-10-CM | POA: Diagnosis not present

## 2014-08-03 DIAGNOSIS — F419 Anxiety disorder, unspecified: Secondary | ICD-10-CM | POA: Diagnosis not present

## 2014-08-03 DIAGNOSIS — J440 Chronic obstructive pulmonary disease with acute lower respiratory infection: Secondary | ICD-10-CM | POA: Diagnosis not present

## 2014-08-03 DIAGNOSIS — I10 Essential (primary) hypertension: Secondary | ICD-10-CM | POA: Diagnosis not present

## 2014-08-03 DIAGNOSIS — J189 Pneumonia, unspecified organism: Secondary | ICD-10-CM | POA: Diagnosis not present

## 2014-08-03 DIAGNOSIS — K219 Gastro-esophageal reflux disease without esophagitis: Secondary | ICD-10-CM | POA: Diagnosis not present

## 2014-08-04 DIAGNOSIS — I480 Paroxysmal atrial fibrillation: Secondary | ICD-10-CM | POA: Diagnosis not present

## 2014-08-04 DIAGNOSIS — J440 Chronic obstructive pulmonary disease with acute lower respiratory infection: Secondary | ICD-10-CM | POA: Diagnosis not present

## 2014-08-04 DIAGNOSIS — I10 Essential (primary) hypertension: Secondary | ICD-10-CM | POA: Diagnosis not present

## 2014-08-04 DIAGNOSIS — K219 Gastro-esophageal reflux disease without esophagitis: Secondary | ICD-10-CM | POA: Diagnosis not present

## 2014-08-04 DIAGNOSIS — F419 Anxiety disorder, unspecified: Secondary | ICD-10-CM | POA: Diagnosis not present

## 2014-08-04 DIAGNOSIS — J189 Pneumonia, unspecified organism: Secondary | ICD-10-CM | POA: Diagnosis not present

## 2014-08-06 DIAGNOSIS — J189 Pneumonia, unspecified organism: Secondary | ICD-10-CM | POA: Diagnosis not present

## 2014-08-06 DIAGNOSIS — F419 Anxiety disorder, unspecified: Secondary | ICD-10-CM | POA: Diagnosis not present

## 2014-08-06 DIAGNOSIS — J440 Chronic obstructive pulmonary disease with acute lower respiratory infection: Secondary | ICD-10-CM | POA: Diagnosis not present

## 2014-08-06 DIAGNOSIS — I10 Essential (primary) hypertension: Secondary | ICD-10-CM | POA: Diagnosis not present

## 2014-08-06 DIAGNOSIS — I480 Paroxysmal atrial fibrillation: Secondary | ICD-10-CM | POA: Diagnosis not present

## 2014-08-06 DIAGNOSIS — K219 Gastro-esophageal reflux disease without esophagitis: Secondary | ICD-10-CM | POA: Diagnosis not present

## 2014-08-07 DIAGNOSIS — I480 Paroxysmal atrial fibrillation: Secondary | ICD-10-CM | POA: Diagnosis not present

## 2014-08-07 DIAGNOSIS — K219 Gastro-esophageal reflux disease without esophagitis: Secondary | ICD-10-CM | POA: Diagnosis not present

## 2014-08-07 DIAGNOSIS — J189 Pneumonia, unspecified organism: Secondary | ICD-10-CM | POA: Diagnosis not present

## 2014-08-07 DIAGNOSIS — F419 Anxiety disorder, unspecified: Secondary | ICD-10-CM | POA: Diagnosis not present

## 2014-08-07 DIAGNOSIS — J440 Chronic obstructive pulmonary disease with acute lower respiratory infection: Secondary | ICD-10-CM | POA: Diagnosis not present

## 2014-08-07 DIAGNOSIS — I10 Essential (primary) hypertension: Secondary | ICD-10-CM | POA: Diagnosis not present

## 2014-08-09 DIAGNOSIS — F419 Anxiety disorder, unspecified: Secondary | ICD-10-CM | POA: Diagnosis not present

## 2014-08-09 DIAGNOSIS — I480 Paroxysmal atrial fibrillation: Secondary | ICD-10-CM | POA: Diagnosis not present

## 2014-08-09 DIAGNOSIS — K219 Gastro-esophageal reflux disease without esophagitis: Secondary | ICD-10-CM | POA: Diagnosis not present

## 2014-08-09 DIAGNOSIS — I10 Essential (primary) hypertension: Secondary | ICD-10-CM | POA: Diagnosis not present

## 2014-08-09 DIAGNOSIS — J189 Pneumonia, unspecified organism: Secondary | ICD-10-CM | POA: Diagnosis not present

## 2014-08-09 DIAGNOSIS — J440 Chronic obstructive pulmonary disease with acute lower respiratory infection: Secondary | ICD-10-CM | POA: Diagnosis not present

## 2014-08-10 DIAGNOSIS — K219 Gastro-esophageal reflux disease without esophagitis: Secondary | ICD-10-CM | POA: Diagnosis not present

## 2014-08-10 DIAGNOSIS — J189 Pneumonia, unspecified organism: Secondary | ICD-10-CM | POA: Diagnosis not present

## 2014-08-10 DIAGNOSIS — J440 Chronic obstructive pulmonary disease with acute lower respiratory infection: Secondary | ICD-10-CM | POA: Diagnosis not present

## 2014-08-10 DIAGNOSIS — I480 Paroxysmal atrial fibrillation: Secondary | ICD-10-CM | POA: Diagnosis not present

## 2014-08-10 DIAGNOSIS — F419 Anxiety disorder, unspecified: Secondary | ICD-10-CM | POA: Diagnosis not present

## 2014-08-10 DIAGNOSIS — I10 Essential (primary) hypertension: Secondary | ICD-10-CM | POA: Diagnosis not present

## 2014-08-11 DIAGNOSIS — J189 Pneumonia, unspecified organism: Secondary | ICD-10-CM | POA: Diagnosis not present

## 2014-08-11 DIAGNOSIS — K219 Gastro-esophageal reflux disease without esophagitis: Secondary | ICD-10-CM | POA: Diagnosis not present

## 2014-08-11 DIAGNOSIS — J440 Chronic obstructive pulmonary disease with acute lower respiratory infection: Secondary | ICD-10-CM | POA: Diagnosis not present

## 2014-08-11 DIAGNOSIS — I10 Essential (primary) hypertension: Secondary | ICD-10-CM | POA: Diagnosis not present

## 2014-08-11 DIAGNOSIS — F419 Anxiety disorder, unspecified: Secondary | ICD-10-CM | POA: Diagnosis not present

## 2014-08-11 DIAGNOSIS — I480 Paroxysmal atrial fibrillation: Secondary | ICD-10-CM | POA: Diagnosis not present

## 2014-08-13 DIAGNOSIS — I480 Paroxysmal atrial fibrillation: Secondary | ICD-10-CM | POA: Diagnosis not present

## 2014-08-13 DIAGNOSIS — F419 Anxiety disorder, unspecified: Secondary | ICD-10-CM | POA: Diagnosis not present

## 2014-08-13 DIAGNOSIS — K219 Gastro-esophageal reflux disease without esophagitis: Secondary | ICD-10-CM | POA: Diagnosis not present

## 2014-08-13 DIAGNOSIS — J189 Pneumonia, unspecified organism: Secondary | ICD-10-CM | POA: Diagnosis not present

## 2014-08-13 DIAGNOSIS — I10 Essential (primary) hypertension: Secondary | ICD-10-CM | POA: Diagnosis not present

## 2014-08-13 DIAGNOSIS — J440 Chronic obstructive pulmonary disease with acute lower respiratory infection: Secondary | ICD-10-CM | POA: Diagnosis not present

## 2014-08-14 DIAGNOSIS — I480 Paroxysmal atrial fibrillation: Secondary | ICD-10-CM | POA: Diagnosis not present

## 2014-08-14 DIAGNOSIS — J449 Chronic obstructive pulmonary disease, unspecified: Secondary | ICD-10-CM | POA: Diagnosis not present

## 2014-08-14 DIAGNOSIS — R0901 Asphyxia: Secondary | ICD-10-CM | POA: Diagnosis not present

## 2014-08-14 DIAGNOSIS — Z681 Body mass index (BMI) 19 or less, adult: Secondary | ICD-10-CM | POA: Diagnosis not present

## 2014-08-15 DIAGNOSIS — I480 Paroxysmal atrial fibrillation: Secondary | ICD-10-CM | POA: Diagnosis not present

## 2014-08-15 DIAGNOSIS — J440 Chronic obstructive pulmonary disease with acute lower respiratory infection: Secondary | ICD-10-CM | POA: Diagnosis not present

## 2014-08-15 DIAGNOSIS — F419 Anxiety disorder, unspecified: Secondary | ICD-10-CM | POA: Diagnosis not present

## 2014-08-15 DIAGNOSIS — K219 Gastro-esophageal reflux disease without esophagitis: Secondary | ICD-10-CM | POA: Diagnosis not present

## 2014-08-15 DIAGNOSIS — J189 Pneumonia, unspecified organism: Secondary | ICD-10-CM | POA: Diagnosis not present

## 2014-08-15 DIAGNOSIS — I10 Essential (primary) hypertension: Secondary | ICD-10-CM | POA: Diagnosis not present

## 2014-08-16 ENCOUNTER — Telehealth: Payer: Self-pay | Admitting: Internal Medicine

## 2014-08-16 ENCOUNTER — Telehealth: Payer: Self-pay | Admitting: Medical Oncology

## 2014-08-16 DIAGNOSIS — I480 Paroxysmal atrial fibrillation: Secondary | ICD-10-CM | POA: Diagnosis not present

## 2014-08-16 DIAGNOSIS — J440 Chronic obstructive pulmonary disease with acute lower respiratory infection: Secondary | ICD-10-CM | POA: Diagnosis not present

## 2014-08-16 DIAGNOSIS — F419 Anxiety disorder, unspecified: Secondary | ICD-10-CM | POA: Diagnosis not present

## 2014-08-16 DIAGNOSIS — I10 Essential (primary) hypertension: Secondary | ICD-10-CM | POA: Diagnosis not present

## 2014-08-16 DIAGNOSIS — J189 Pneumonia, unspecified organism: Secondary | ICD-10-CM | POA: Diagnosis not present

## 2014-08-16 DIAGNOSIS — K219 Gastro-esophageal reflux disease without esophagitis: Secondary | ICD-10-CM | POA: Diagnosis not present

## 2014-08-16 NOTE — Telephone Encounter (Signed)
lvm for pt regarding to June appt cx and moved to July...mailed pt appt sched and letter

## 2014-08-16 NOTE — Telephone Encounter (Signed)
I spoke wife and told her we will r/s his appts.

## 2014-08-17 DIAGNOSIS — I10 Essential (primary) hypertension: Secondary | ICD-10-CM | POA: Diagnosis not present

## 2014-08-17 DIAGNOSIS — J440 Chronic obstructive pulmonary disease with acute lower respiratory infection: Secondary | ICD-10-CM | POA: Diagnosis not present

## 2014-08-17 DIAGNOSIS — F419 Anxiety disorder, unspecified: Secondary | ICD-10-CM | POA: Diagnosis not present

## 2014-08-17 DIAGNOSIS — I480 Paroxysmal atrial fibrillation: Secondary | ICD-10-CM | POA: Diagnosis not present

## 2014-08-17 DIAGNOSIS — J189 Pneumonia, unspecified organism: Secondary | ICD-10-CM | POA: Diagnosis not present

## 2014-08-17 DIAGNOSIS — K219 Gastro-esophageal reflux disease without esophagitis: Secondary | ICD-10-CM | POA: Diagnosis not present

## 2014-08-20 DIAGNOSIS — F419 Anxiety disorder, unspecified: Secondary | ICD-10-CM | POA: Diagnosis not present

## 2014-08-20 DIAGNOSIS — K219 Gastro-esophageal reflux disease without esophagitis: Secondary | ICD-10-CM | POA: Diagnosis not present

## 2014-08-20 DIAGNOSIS — I480 Paroxysmal atrial fibrillation: Secondary | ICD-10-CM | POA: Diagnosis not present

## 2014-08-20 DIAGNOSIS — I10 Essential (primary) hypertension: Secondary | ICD-10-CM | POA: Diagnosis not present

## 2014-08-20 DIAGNOSIS — J440 Chronic obstructive pulmonary disease with acute lower respiratory infection: Secondary | ICD-10-CM | POA: Diagnosis not present

## 2014-08-20 DIAGNOSIS — J189 Pneumonia, unspecified organism: Secondary | ICD-10-CM | POA: Diagnosis not present

## 2014-08-21 DIAGNOSIS — J440 Chronic obstructive pulmonary disease with acute lower respiratory infection: Secondary | ICD-10-CM | POA: Diagnosis not present

## 2014-08-21 DIAGNOSIS — J189 Pneumonia, unspecified organism: Secondary | ICD-10-CM | POA: Diagnosis not present

## 2014-08-21 DIAGNOSIS — I480 Paroxysmal atrial fibrillation: Secondary | ICD-10-CM | POA: Diagnosis not present

## 2014-08-21 DIAGNOSIS — F419 Anxiety disorder, unspecified: Secondary | ICD-10-CM | POA: Diagnosis not present

## 2014-08-21 DIAGNOSIS — K219 Gastro-esophageal reflux disease without esophagitis: Secondary | ICD-10-CM | POA: Diagnosis not present

## 2014-08-21 DIAGNOSIS — I10 Essential (primary) hypertension: Secondary | ICD-10-CM | POA: Diagnosis not present

## 2014-08-22 DIAGNOSIS — I10 Essential (primary) hypertension: Secondary | ICD-10-CM | POA: Diagnosis not present

## 2014-08-22 DIAGNOSIS — J189 Pneumonia, unspecified organism: Secondary | ICD-10-CM | POA: Diagnosis not present

## 2014-08-22 DIAGNOSIS — F419 Anxiety disorder, unspecified: Secondary | ICD-10-CM | POA: Diagnosis not present

## 2014-08-22 DIAGNOSIS — K219 Gastro-esophageal reflux disease without esophagitis: Secondary | ICD-10-CM | POA: Diagnosis not present

## 2014-08-22 DIAGNOSIS — J440 Chronic obstructive pulmonary disease with acute lower respiratory infection: Secondary | ICD-10-CM | POA: Diagnosis not present

## 2014-08-22 DIAGNOSIS — I480 Paroxysmal atrial fibrillation: Secondary | ICD-10-CM | POA: Diagnosis not present

## 2014-08-23 ENCOUNTER — Ambulatory Visit (HOSPITAL_COMMUNITY): Payer: Medicare Other

## 2014-08-23 ENCOUNTER — Other Ambulatory Visit: Payer: Medicare Other

## 2014-08-23 DIAGNOSIS — F419 Anxiety disorder, unspecified: Secondary | ICD-10-CM | POA: Diagnosis not present

## 2014-08-23 DIAGNOSIS — J189 Pneumonia, unspecified organism: Secondary | ICD-10-CM | POA: Diagnosis not present

## 2014-08-23 DIAGNOSIS — I480 Paroxysmal atrial fibrillation: Secondary | ICD-10-CM | POA: Diagnosis not present

## 2014-08-23 DIAGNOSIS — J440 Chronic obstructive pulmonary disease with acute lower respiratory infection: Secondary | ICD-10-CM | POA: Diagnosis not present

## 2014-08-23 DIAGNOSIS — K219 Gastro-esophageal reflux disease without esophagitis: Secondary | ICD-10-CM | POA: Diagnosis not present

## 2014-08-23 DIAGNOSIS — I10 Essential (primary) hypertension: Secondary | ICD-10-CM | POA: Diagnosis not present

## 2014-08-24 DIAGNOSIS — I10 Essential (primary) hypertension: Secondary | ICD-10-CM | POA: Diagnosis not present

## 2014-08-24 DIAGNOSIS — J440 Chronic obstructive pulmonary disease with acute lower respiratory infection: Secondary | ICD-10-CM | POA: Diagnosis not present

## 2014-08-24 DIAGNOSIS — I480 Paroxysmal atrial fibrillation: Secondary | ICD-10-CM | POA: Diagnosis not present

## 2014-08-24 DIAGNOSIS — K219 Gastro-esophageal reflux disease without esophagitis: Secondary | ICD-10-CM | POA: Diagnosis not present

## 2014-08-24 DIAGNOSIS — F419 Anxiety disorder, unspecified: Secondary | ICD-10-CM | POA: Diagnosis not present

## 2014-08-24 DIAGNOSIS — J189 Pneumonia, unspecified organism: Secondary | ICD-10-CM | POA: Diagnosis not present

## 2014-08-29 ENCOUNTER — Ambulatory Visit: Payer: Medicare Other | Admitting: Internal Medicine

## 2014-08-29 DIAGNOSIS — I10 Essential (primary) hypertension: Secondary | ICD-10-CM | POA: Diagnosis not present

## 2014-08-29 DIAGNOSIS — J440 Chronic obstructive pulmonary disease with acute lower respiratory infection: Secondary | ICD-10-CM | POA: Diagnosis not present

## 2014-08-29 DIAGNOSIS — F419 Anxiety disorder, unspecified: Secondary | ICD-10-CM | POA: Diagnosis not present

## 2014-08-29 DIAGNOSIS — I480 Paroxysmal atrial fibrillation: Secondary | ICD-10-CM | POA: Diagnosis not present

## 2014-08-29 DIAGNOSIS — J189 Pneumonia, unspecified organism: Secondary | ICD-10-CM | POA: Diagnosis not present

## 2014-08-29 DIAGNOSIS — K219 Gastro-esophageal reflux disease without esophagitis: Secondary | ICD-10-CM | POA: Diagnosis not present

## 2014-08-30 ENCOUNTER — Ambulatory Visit: Payer: Medicare Other | Admitting: Internal Medicine

## 2014-08-31 DIAGNOSIS — F419 Anxiety disorder, unspecified: Secondary | ICD-10-CM | POA: Diagnosis not present

## 2014-08-31 DIAGNOSIS — I480 Paroxysmal atrial fibrillation: Secondary | ICD-10-CM | POA: Diagnosis not present

## 2014-08-31 DIAGNOSIS — J440 Chronic obstructive pulmonary disease with acute lower respiratory infection: Secondary | ICD-10-CM | POA: Diagnosis not present

## 2014-08-31 DIAGNOSIS — I10 Essential (primary) hypertension: Secondary | ICD-10-CM | POA: Diagnosis not present

## 2014-08-31 DIAGNOSIS — J189 Pneumonia, unspecified organism: Secondary | ICD-10-CM | POA: Diagnosis not present

## 2014-08-31 DIAGNOSIS — K219 Gastro-esophageal reflux disease without esophagitis: Secondary | ICD-10-CM | POA: Diagnosis not present

## 2014-09-01 ENCOUNTER — Encounter: Payer: Self-pay | Admitting: Cardiovascular Disease

## 2014-09-01 ENCOUNTER — Ambulatory Visit (INDEPENDENT_AMBULATORY_CARE_PROVIDER_SITE_OTHER): Payer: Medicare Other | Admitting: Cardiovascular Disease

## 2014-09-01 VITALS — BP 142/68 | HR 88 | Ht 73.0 in | Wt 145.0 lb

## 2014-09-01 DIAGNOSIS — I480 Paroxysmal atrial fibrillation: Secondary | ICD-10-CM | POA: Diagnosis not present

## 2014-09-01 DIAGNOSIS — Z87898 Personal history of other specified conditions: Secondary | ICD-10-CM

## 2014-09-01 DIAGNOSIS — I10 Essential (primary) hypertension: Secondary | ICD-10-CM | POA: Diagnosis not present

## 2014-09-01 DIAGNOSIS — E785 Hyperlipidemia, unspecified: Secondary | ICD-10-CM | POA: Diagnosis not present

## 2014-09-01 DIAGNOSIS — M25473 Effusion, unspecified ankle: Secondary | ICD-10-CM

## 2014-09-01 DIAGNOSIS — Z9289 Personal history of other medical treatment: Secondary | ICD-10-CM

## 2014-09-01 MED ORDER — METOPROLOL TARTRATE 25 MG PO TABS: 25.0000 mg | ORAL_TABLET | Freq: Two times a day (BID) | ORAL | Status: DC

## 2014-09-01 NOTE — Progress Notes (Signed)
Patient ID: Adam Ward, male   DOB: Feb 17, 1936, 79 y.o.   MRN: [id number]      SUBJECTIVE: The patient is a 79 year old male with a history of paroxysmal atrial fibrillation, lung cancer, hypertension, and hyperlipidemia who returns for annual follow-up. He was hospitalized in May 2016 for acute hypoxic respiratory failure secondary to community-acquired pneumonia. He had some very mild demand ischemia (troponins peaked at 0.16) at that time as well as atrial fibrillation. He has been maintained on aspirin.   Echocardiogram on 07/27/14 demonstrated normal left ventricular systolic function and regional wall motion, LVEF 60-65% , mild right atrial and right ventricular dilatation, indeterminate diastolic function , with aortic valve sclerosis without stenosis.  Relevant labs include BUN 17 and creatinine 1.14 on 5/25, and Hgb 11.9 on 07/27/14.  He said he has never had chest pain and currently denies palpitations. He is using 2.5 L of oxygen since being hospitalized and wonders if he can come off of it. He has home health and says that his oxygen saturations are normal without supplemental O2.  His wife, Collie Siad, has atrial fibrillation and takes Xarelto and metoprolol and sees Dr. Harrington Challenger. The patient is very reluctant to consider taking an anticoagulant due to TV commercials. He has ankle and feet swelling and wonders if it is due to one of his medications.    Review of Systems: As per "subjective", otherwise negative.  Allergies  Allergen Reactions  . Celebrex [Celecoxib] Other (See Comments)    Bothers the patients eyes.  . Tetracyclines & Related Other (See Comments)    Breaks out in mouth.    Current Outpatient Prescriptions  Medication Sig Dispense Refill  . alprazolam (XANAX) 2 MG tablet Take 2 mg by mouth 3 (three) times daily.    Marland Kitchen aspirin EC 81 MG tablet Take 81 mg by mouth daily.    Marland Kitchen CARTIA XT 180 MG 24 hr capsule Take 180 mg by mouth daily.    . digoxin (LANOXIN) 0.125 MG tablet  Take 1 tablet by mouth daily.    Marland Kitchen docusate sodium (COLACE) 100 MG capsule Take 100-200 mg by mouth daily as needed for mild constipation or moderate constipation.     Marland Kitchen levothyroxine (SYNTHROID, LEVOTHROID) 50 MCG tablet Take 50 mcg by mouth daily before breakfast.    . Multiple Vitamins-Minerals (MULTIVITAMIN WITH MINERALS) tablet Take 1 tablet by mouth daily.    . Omega-3 Fatty Acids (FISH OIL) 1000 MG CAPS Take 1 capsule by mouth daily.    Marland Kitchen PROAIR HFA 108 (90 BASE) MCG/ACT inhaler Inhale 1 puff into the lungs every 4 (four) hours as needed for wheezing or shortness of breath.     . Saw Palmetto, Serenoa repens, (SAW PALMETTO BERRIES PO) Take 2 capsules by mouth daily.    . simvastatin (ZOCOR) 20 MG tablet Take 20 mg by mouth daily.     No current facility-administered medications for this visit.   Facility-Administered Medications Ordered in Other Visits  Medication Dose Route Frequency Provider Last Rate Last Dose  . heparin lock flush 100 unit/mL  500 Units Intravenous Once Curt Bears, MD   500 Units at 06/15/13 1316  . sodium chloride 0.9 % injection 10 mL  10 mL Intravenous PRN Curt Bears, MD   10 mL at 06/15/13 1317    Past Medical History  Diagnosis Date  . Hypertension   . High cholesterol   . Anxiety   . Rapid atrial fibrillation 07/28/2011    Paroxysmal, newly diagnosed.  Marland Kitchen  Cavitary lesion of lung 07/28/2011    AFB smears x3 negative.  . Acute respiratory failure with hypoxia 07/26/2011  . Bulla of lung 07/28/2011  . Tobacco abuse 08/01/2011  . COPD (chronic obstructive pulmonary disease)   . Pneumonia   . Numbness and tingling     Hx: of in right hand and B/LLE  . GERD (gastroesophageal reflux disease)     Hx: of  . Headache(784.0)   . Colon cancer 1993  . Lung cancer     Past Surgical History  Procedure Laterality Date  . Colon surgery    . Cervical disc surgery    . Tonsillectomy    . Appendectomy    . Eye surgery      as a child  . Video  bronchoscopy with endobronchial navigation N/A 05/18/2013    Procedure: VIDEO BRONCHOSCOPY WITH ENDOBRONCHIAL NAVIGATION;  Surgeon: [name], MD;  Location: [facility];  Service: Thoracic;  Laterality: N/A;    History   Social History  . Marital Status: Married    Spouse Name: N/A  . Number of Children: N/A  . Years of Education: N/A   Occupational History  . Not on file.   Social History Main Topics  . Smoking status: Former Smoker -- 1.00 packs/day for 50 years    Types: Cigarettes    Quit date: 07/16/2011  . Smokeless tobacco: Never Used     Comment: started using electric cigarettes in 06-2010  . Alcohol Use: No  . Drug Use: No  . Sexual Activity: Not Currently   Other Topics Concern  . Not on file   Social History Narrative     Filed Vitals:   09/01/14 1609  BP: 142/68  Pulse: 88  Height: '6\' 1"'$  (1.854 m)  Weight: 145 lb (65.772 kg)    PHYSICAL EXAM General: NAD, using oxygen by nasal cannula. HEENT: Normal. Neck: No JVD, no thyromegaly. Lungs: Diminished sounds w/o rales/wheezes. CV: Distant tones. Regular rate and rhythm, normal S1/S2, no S3/S4, no murmur. 1+ periankle edema.   Abdomen: Soft, nontender, no distention.  Neurologic: Alert and oriented x 3.  Psych: Normal affect. Skin: Normal. Musculoskeletal: No gross deformities. Extremities: No clubbing or cyanosis.   ECG: Most recent ECG reviewed.      ASSESSMENT AND PLAN: 1. Paroxysmal atrial fibrillation: Currently in a regular rhythm. Given ankle/feet swelling, will d/c Cartia XT and start metoprolol 25 mg bid. Already taking digoxin 0.125 mg daily which I may d/c in the future. CHADS2VASC score is at least 3 if not 4, thus placing him at high thromboembolic risk. I had a long discussion with the patient and his wife. He will consider taking Xarelto. If he were to do so, I would stop ASA.  2. Ankle/feet swelling: Will d/c Cartia XT.  3. Essential HTN: Controlled. Will monitor given  medication adjustments as noted above.  4. Hyperlipidemia: Continue simvastatin 20 mg.  Dispo: f/u 6 months.  Time spent: 40 minutes, of which greater than 50% was spent reviewing symptoms, relevant blood tests and studies, and discussing management plan with the patient.  [name], M.D., F.A.C.C.

## 2014-09-01 NOTE — Patient Instructions (Signed)
   Stop Cartia XT.  Begin Metoprolol tart '25mg'$  twice a day  - new sent to [facility] today.  Continue all other medications.   Your physician wants you to follow up in: 6 months.  You will receive a reminder letter in the mail one-two months in advance.  If you don't receive a letter, please call our office to schedule the follow up appointment

## 2014-09-04 DIAGNOSIS — K219 Gastro-esophageal reflux disease without esophagitis: Secondary | ICD-10-CM | POA: Diagnosis not present

## 2014-09-04 DIAGNOSIS — F419 Anxiety disorder, unspecified: Secondary | ICD-10-CM | POA: Diagnosis not present

## 2014-09-04 DIAGNOSIS — I480 Paroxysmal atrial fibrillation: Secondary | ICD-10-CM | POA: Diagnosis not present

## 2014-09-04 DIAGNOSIS — J189 Pneumonia, unspecified organism: Secondary | ICD-10-CM | POA: Diagnosis not present

## 2014-09-04 DIAGNOSIS — J440 Chronic obstructive pulmonary disease with acute lower respiratory infection: Secondary | ICD-10-CM | POA: Diagnosis not present

## 2014-09-04 DIAGNOSIS — I10 Essential (primary) hypertension: Secondary | ICD-10-CM | POA: Diagnosis not present

## 2014-09-05 DIAGNOSIS — F419 Anxiety disorder, unspecified: Secondary | ICD-10-CM | POA: Diagnosis not present

## 2014-09-05 DIAGNOSIS — I480 Paroxysmal atrial fibrillation: Secondary | ICD-10-CM | POA: Diagnosis not present

## 2014-09-05 DIAGNOSIS — K219 Gastro-esophageal reflux disease without esophagitis: Secondary | ICD-10-CM | POA: Diagnosis not present

## 2014-09-05 DIAGNOSIS — J189 Pneumonia, unspecified organism: Secondary | ICD-10-CM | POA: Diagnosis not present

## 2014-09-05 DIAGNOSIS — J440 Chronic obstructive pulmonary disease with acute lower respiratory infection: Secondary | ICD-10-CM | POA: Diagnosis not present

## 2014-09-05 DIAGNOSIS — I10 Essential (primary) hypertension: Secondary | ICD-10-CM | POA: Diagnosis not present

## 2014-09-06 DIAGNOSIS — K219 Gastro-esophageal reflux disease without esophagitis: Secondary | ICD-10-CM | POA: Diagnosis not present

## 2014-09-06 DIAGNOSIS — J189 Pneumonia, unspecified organism: Secondary | ICD-10-CM | POA: Diagnosis not present

## 2014-09-06 DIAGNOSIS — J440 Chronic obstructive pulmonary disease with acute lower respiratory infection: Secondary | ICD-10-CM | POA: Diagnosis not present

## 2014-09-06 DIAGNOSIS — F419 Anxiety disorder, unspecified: Secondary | ICD-10-CM | POA: Diagnosis not present

## 2014-09-06 DIAGNOSIS — I10 Essential (primary) hypertension: Secondary | ICD-10-CM | POA: Diagnosis not present

## 2014-09-06 DIAGNOSIS — I480 Paroxysmal atrial fibrillation: Secondary | ICD-10-CM | POA: Diagnosis not present

## 2014-09-07 DIAGNOSIS — J189 Pneumonia, unspecified organism: Secondary | ICD-10-CM | POA: Diagnosis not present

## 2014-09-07 DIAGNOSIS — J440 Chronic obstructive pulmonary disease with acute lower respiratory infection: Secondary | ICD-10-CM | POA: Diagnosis not present

## 2014-09-07 DIAGNOSIS — I480 Paroxysmal atrial fibrillation: Secondary | ICD-10-CM | POA: Diagnosis not present

## 2014-09-07 DIAGNOSIS — K219 Gastro-esophageal reflux disease without esophagitis: Secondary | ICD-10-CM | POA: Diagnosis not present

## 2014-09-07 DIAGNOSIS — F419 Anxiety disorder, unspecified: Secondary | ICD-10-CM | POA: Diagnosis not present

## 2014-09-07 DIAGNOSIS — I10 Essential (primary) hypertension: Secondary | ICD-10-CM | POA: Diagnosis not present

## 2014-09-10 DIAGNOSIS — F419 Anxiety disorder, unspecified: Secondary | ICD-10-CM | POA: Diagnosis not present

## 2014-09-10 DIAGNOSIS — J440 Chronic obstructive pulmonary disease with acute lower respiratory infection: Secondary | ICD-10-CM | POA: Diagnosis not present

## 2014-09-10 DIAGNOSIS — K219 Gastro-esophageal reflux disease without esophagitis: Secondary | ICD-10-CM | POA: Diagnosis not present

## 2014-09-10 DIAGNOSIS — J189 Pneumonia, unspecified organism: Secondary | ICD-10-CM | POA: Diagnosis not present

## 2014-09-10 DIAGNOSIS — I10 Essential (primary) hypertension: Secondary | ICD-10-CM | POA: Diagnosis not present

## 2014-09-10 DIAGNOSIS — I480 Paroxysmal atrial fibrillation: Secondary | ICD-10-CM | POA: Diagnosis not present

## 2014-09-12 ENCOUNTER — Other Ambulatory Visit (HOSPITAL_BASED_OUTPATIENT_CLINIC_OR_DEPARTMENT_OTHER): Payer: Medicare Other

## 2014-09-12 ENCOUNTER — Encounter (HOSPITAL_COMMUNITY): Payer: Self-pay

## 2014-09-12 ENCOUNTER — Ambulatory Visit (HOSPITAL_COMMUNITY)
Admission: RE | Admit: 2014-09-12 | Discharge: 2014-09-12 | Disposition: A | Discharge status: Home / Self Care | Payer: Medicare Other | Source: Ambulatory Visit | Attending: Internal Medicine | Admitting: Internal Medicine

## 2014-09-12 DIAGNOSIS — C3411 Malignant neoplasm of upper lobe, right bronchus or lung: Secondary | ICD-10-CM | POA: Insufficient documentation

## 2014-09-12 DIAGNOSIS — R918 Other nonspecific abnormal finding of lung field: Secondary | ICD-10-CM | POA: Insufficient documentation

## 2014-09-12 DIAGNOSIS — I2510 Atherosclerotic heart disease of native coronary artery without angina pectoris: Secondary | ICD-10-CM | POA: Insufficient documentation

## 2014-09-12 DIAGNOSIS — Z85038 Personal history of other malignant neoplasm of large intestine: Secondary | ICD-10-CM | POA: Diagnosis not present

## 2014-09-12 DIAGNOSIS — Z08 Encounter for follow-up examination after completed treatment for malignant neoplasm: Secondary | ICD-10-CM | POA: Diagnosis not present

## 2014-09-12 LAB — CBC WITH DIFFERENTIAL/PLATELET
BASO%: 0.4 % (ref 0.0–2.0)
BASOS ABS: 0 10*3/uL (ref 0.0–0.1)
EOS ABS: 0.2 10*3/uL (ref 0.0–0.5)
EOS%: 3.7 % (ref 0.0–7.0)
HCT: 42.1 % (ref 38.4–49.9)
HGB: 13.8 g/dL (ref 13.0–17.1)
LYMPH#: 1.7 10*3/uL (ref 0.9–3.3)
LYMPH%: 26.5 % (ref 14.0–49.0)
MCH: 28.4 pg (ref 27.2–33.4)
MCHC: 32.7 g/dL (ref 32.0–36.0)
MCV: 86.8 fL (ref 79.3–98.0)
MONO#: 0.6 10*3/uL (ref 0.1–0.9)
MONO%: 9.2 % (ref 0.0–14.0)
NEUT%: 60.2 % (ref 39.0–75.0)
NEUTROS ABS: 3.9 10*3/uL (ref 1.5–6.5)
Platelets: 202 10*3/uL (ref 140–400)
RBC: 4.85 10*6/uL (ref 4.20–5.82)
RDW: 14.4 % (ref 11.0–14.6)
WBC: 6.5 10*3/uL (ref 4.0–10.3)

## 2014-09-12 LAB — COMPREHENSIVE METABOLIC PANEL (CC13)
ALK PHOS: 87 U/L (ref 40–150)
ALT: 10 U/L (ref 0–55)
AST: 18 U/L (ref 5–34)
Albumin: 3.5 g/dL (ref 3.5–5.0)
Anion Gap: 5 mEq/L (ref 3–11)
BUN: 8.3 mg/dL (ref 7.0–26.0)
CHLORIDE: 97 meq/L — AB (ref 98–109)
CO2: 33 mEq/L — ABNORMAL HIGH (ref 22–29)
Calcium: 9 mg/dL (ref 8.4–10.4)
Creatinine: 0.9 mg/dL (ref 0.7–1.3)
EGFR: 84 mL/min/{1.73_m2} — ABNORMAL LOW (ref >90)
GLUCOSE: 98 mg/dL (ref 70–140)
Potassium: 4.8 mEq/L (ref 3.5–5.1)
SODIUM: 135 meq/L — AB (ref 136–145)
Total Bilirubin: 0.49 mg/dL (ref 0.20–1.20)
Total Protein: 7 g/dL (ref 6.4–8.3)

## 2014-09-12 MED ORDER — IOHEXOL 300 MG/ML  SOLN
80.0000 mL | Freq: Once | INTRAMUSCULAR | Status: AC | PRN
  Administered 2014-09-12: 80 mL via INTRAVENOUS

## 2014-09-13 DIAGNOSIS — F419 Anxiety disorder, unspecified: Secondary | ICD-10-CM | POA: Diagnosis not present

## 2014-09-13 DIAGNOSIS — J189 Pneumonia, unspecified organism: Secondary | ICD-10-CM | POA: Diagnosis not present

## 2014-09-13 DIAGNOSIS — I480 Paroxysmal atrial fibrillation: Secondary | ICD-10-CM | POA: Diagnosis not present

## 2014-09-13 DIAGNOSIS — J440 Chronic obstructive pulmonary disease with acute lower respiratory infection: Secondary | ICD-10-CM | POA: Diagnosis not present

## 2014-09-13 DIAGNOSIS — K219 Gastro-esophageal reflux disease without esophagitis: Secondary | ICD-10-CM | POA: Diagnosis not present

## 2014-09-13 DIAGNOSIS — I10 Essential (primary) hypertension: Secondary | ICD-10-CM | POA: Diagnosis not present

## 2014-09-19 ENCOUNTER — Telehealth: Payer: Self-pay | Admitting: Internal Medicine

## 2014-09-19 ENCOUNTER — Ambulatory Visit (HOSPITAL_BASED_OUTPATIENT_CLINIC_OR_DEPARTMENT_OTHER): Payer: Medicare Other | Admitting: Internal Medicine

## 2014-09-19 ENCOUNTER — Encounter: Payer: Self-pay | Admitting: *Deleted

## 2014-09-19 ENCOUNTER — Encounter: Payer: Self-pay | Admitting: Internal Medicine

## 2014-09-19 VITALS — BP 137/73 | HR 79 | Temp 98.4°F | Resp 16 | Ht 73.0 in | Wt 146.7 lb

## 2014-09-19 DIAGNOSIS — C3411 Malignant neoplasm of upper lobe, right bronchus or lung: Secondary | ICD-10-CM

## 2014-09-19 NOTE — Progress Notes (Signed)
  Oncology Nurse Navigator Documentation    Navigator Encounter Type:  (Routine follow up) (09/19/14 1325)Patient at [facility] for follow up with Dr. Julien Nordmann.  Patient reports he is doing pretty well.  He was hospitalized in May for pneumonia and is just beginning to feel better and regain his strength.  His wife reports that he is doing better however she is very fatigued having to care for him and a paralyzed son who lives with them.  They denied any questions or concerns at this time.  I encouraged them to call me for any needs.    Barriers/Navigation Needs: No barriers at this time (09/19/14 1325)

## 2014-09-19 NOTE — Telephone Encounter (Signed)
per pof to sch pt appt-gave pt copy of avs-called Diane per pt req to ask about scan for CT pt stated need to know if 3 months or 18mhs

## 2014-09-19 NOTE — Progress Notes (Signed)
[facility] [phone]   Fax:([phone]) [place], [place] [address] [place] [address]  DIAGNOSIS: Unresectable he stage IIB (T3, N0, M0) non-small cell lung cancer, adenocarcinoma with positive EGFR amplification (G598V, G719A) diagnosed in March of 2015.  PRIOR THERAPY: Stereotactic radiotherapy to the right upper lobe pulmonary nodules under the care of Dr. Tammi Klippel completed on 07/26/2013.  CURRENT THERAPY: Observation.  INTERVAL HISTORY: Adam Ward 79 y.o. male returns to the clinic today for 6 months followup visit accompanied by his wife. The patient is feeling fine today with no specific complaints except for weakness. He was recently treated for pneumonia. He denied having any significant chest pain but has shortness of breath with exertion with no hemoptysis. The patient denied having any significant weight loss or night sweats. He denied having any fever or chills, no nausea or vomiting. He had repeat CT scan of the chest performed recently and he is here for evaluation and discussion of his scan results.  MEDICAL HISTORY: Past Medical History  Diagnosis Date  . Hypertension   . High cholesterol   . Anxiety   . Rapid atrial fibrillation 07/28/2011    Paroxysmal, newly diagnosed.  . Cavitary lesion of lung 07/28/2011    AFB smears x3 negative.  . Acute respiratory failure with hypoxia 07/26/2011  . Bulla of lung 07/28/2011  . Tobacco abuse 08/01/2011  . COPD (chronic obstructive pulmonary disease)   . Pneumonia   . Numbness and tingling     Hx: of in right hand and B/LLE  . GERD (gastroesophageal reflux disease)     Hx: of  . Headache(784.0)   . Colon cancer 1993  . Lung cancer     ALLERGIES:  is allergic to celebrex and tetracyclines & related.  MEDICATIONS:  Current Outpatient Prescriptions  Medication Sig Dispense Refill  . alprazolam (XANAX) 2 MG tablet Take 2 mg by mouth 3 (three)  times daily.    Marland Kitchen aspirin EC 81 MG tablet Take 81 mg by mouth daily.    . digoxin (LANOXIN) 0.125 MG tablet Take 1 tablet by mouth daily.    Marland Kitchen docusate sodium (COLACE) 100 MG capsule Take 100-200 mg by mouth daily as needed for mild constipation or moderate constipation.     Marland Kitchen levothyroxine (SYNTHROID, LEVOTHROID) 50 MCG tablet Take 50 mcg by mouth daily before breakfast.    . metoprolol tartrate (LOPRESSOR) 25 MG tablet Take 1 tablet (25 mg total) by mouth 2 (two) times daily. 60 tablet 6  . Multiple Vitamins-Minerals (MULTIVITAMIN WITH MINERALS) tablet Take 1 tablet by mouth daily.    . Omega-3 Fatty Acids (FISH OIL) 1000 MG CAPS Take 1 capsule by mouth daily.    Marland Kitchen PROAIR HFA 108 (90 BASE) MCG/ACT inhaler Inhale 1 puff into the lungs every 4 (four) hours as needed for wheezing or shortness of breath.     . Saw Palmetto, Serenoa repens, (SAW PALMETTO BERRIES PO) Take 2 capsules by mouth daily.    . simvastatin (ZOCOR) 20 MG tablet Take 20 mg by mouth daily.     No current facility-administered medications for this visit.   Facility-Administered Medications Ordered in Other Visits  Medication Dose Route Frequency Provider Last Rate Last Dose  . heparin lock flush 100 unit/mL  500 Units Intravenous Once Curt Bears, MD   500 Units at 06/15/13 1316  . sodium chloride 0.9 % injection 10 mL  10 mL Intravenous  PRN Curt Bears, MD   10 mL at 06/15/13 1317    SURGICAL HISTORY:  Past Surgical History  Procedure Laterality Date  . Colon surgery    . Cervical disc surgery    . Tonsillectomy    . Appendectomy    . Eye surgery      as a child  . Video bronchoscopy with endobronchial navigation N/A 05/18/2013    Procedure: VIDEO BRONCHOSCOPY WITH ENDOBRONCHIAL NAVIGATION;  Surgeon: [name], MD;  Location: [facility];  Service: Thoracic;  Laterality: N/A;    REVIEW OF SYSTEMS:  A comprehensive review of systems was negative except for: Constitutional: positive for fatigue Respiratory:  positive for dyspnea on exertion   PHYSICAL EXAMINATION: General appearance: alert, cooperative and no distress Head: Normocephalic, without obvious abnormality, atraumatic Neck: no adenopathy, no JVD, supple, symmetrical, trachea midline and thyroid not enlarged, symmetric, no tenderness/mass/nodules Lymph nodes: Cervical, supraclavicular, and axillary nodes normal. Resp: clear to auscultation bilaterally Back: symmetric, no curvature. ROM normal. No CVA tenderness. Cardio: regular rate and rhythm, S1, S2 normal, no murmur, click, rub or gallop GI: soft, non-tender; bowel sounds normal; no masses,  no organomegaly Extremities: extremities normal, atraumatic, no cyanosis or edema Neurologic: Alert and oriented X 3, normal strength and tone. Normal symmetric reflexes. Normal coordination and gait  ECOG PERFORMANCE STATUS: 1 - Symptomatic but completely ambulatory  Blood pressure 137/73, pulse 79, temperature 98.4 F (36.9 C), temperature source Oral, resp. rate 16, height _0  (1.854 m), weight 146 lb 11.2 oz (66.543 kg), SpO2 90 %.  LABORATORY DATA: Lab Results  Component Value Date   WBC 6.5 09/12/2014   HGB 13.8 09/12/2014   HCT 42.1 09/12/2014   MCV 86.8 09/12/2014   PLT 202 09/12/2014      Chemistry      Component Value Date/Time   NA 135* 09/12/2014 1325   NA 140 07/26/2014 0212   K 4.8 09/12/2014 1325   K 3.6 07/26/2014 0212   CL 104 07/26/2014 0212   CO2 33* 09/12/2014 1325   CO2 30 07/26/2014 0212   BUN 8.3 09/12/2014 1325   BUN 17 07/26/2014 0212   CREATININE 0.9 09/12/2014 1325   CREATININE 1.14 07/26/2014 0212   CREATININE 0.88 01/04/2013 1627      Component Value Date/Time   CALCIUM 9.0 09/12/2014 1325   CALCIUM 7.3* 07/26/2014 0212   ALKPHOS 87 09/12/2014 1325   ALKPHOS 96 05/18/2013 0919   AST 18 09/12/2014 1325   AST 18 05/18/2013 0919   ALT 10 09/12/2014 1325   ALT 9 05/18/2013 0919   BILITOT 0.49 09/12/2014 1325   BILITOT 0.4 05/18/2013 0919         RADIOGRAPHIC STUDIES: Ct Chest W Contrast  09/12/2014   CLINICAL DATA:  Right lung cancer diagnosed 3/15. Radiation therapy completed in 2015. Colon cancer 23 years ago. Right upper lobe primary. Restaging.  EXAM: CT CHEST WITH CONTRAST  TECHNIQUE: Multidetector CT imaging of the chest was performed during intravenous contrast administration.  CONTRAST:  52m OMNIPAQUE IOHEXOL 300 MG/ML  SOLN  COMPARISON:  07/25/2014 chest radiograph.  CT of 02/15/2014.  FINDINGS: Mediastinum/Nodes: No supraclavicular adenopathy. Aortic and branch vessel atherosclerosis. Extensive ulcerative plaque in the descending thoracic aorta. Normal heart size, without pericardial effusion. LAD coronary artery atherosclerosis. No central pulmonary embolism, on this non-dedicated study. No mediastinal or hilar adenopathy. Small hiatal hernia.  Lungs/Pleura: No pleural fluid. Secretions in the right-sided trachea. Lower lobe predominant bronchial wall thickening. Moderate centrilobular emphysema.  Biapical pleural parenchymal scarring.  Probable scarring within the right upper lobe laterally. On the order of 2.6 x 2.8 cm on image 17. Compare 2.3 x 1.1 cm on the prior.  Inferior medial nodular right upper lobe opacity (likely scarring) measures 9 mm on image 20 and is unchanged.  Right worse than left interstitial, ground-glass, and reticular nodular opacities are progressive. Examples in the posterior right upper lobe and right lower lobe on image 36 of series 5. Subtle reticular nodular opacities in the left lower lobe on image 55.  Upper abdomen: Normal imaged portions of the liver, spleen, adrenal glands. Proximal gastric underdistention. Normal imaged portions of the pancreas, and kidneys, and gallbladder.  Musculoskeletal: Lower cervical spine fixation.  IMPRESSION: 1. Similar appearance of right upper lobe presumed scarring. No typical findings of lung cancer recurrence or metastatic disease. 2. New and progressive right worse  than left pulmonary opacities, most consistent with interval infection. 3.  Atherosclerosis, including within the coronary arteries.   Electronically Signed   By: Abigail Miyamoto M.D.   On: 09/12/2014 16:53   ASSESSMENT AND PLAN: This is a very pleasant 79 years old white male recently diagnosed with unresectable stage IIB non-small cell lung cancer, adenocarcinoma, with EGFR amplification, with 2 separate lesions in the right upper lobe and questionable lesion in the left upper lobe. He is status post stereotactic radiotherapy to the right lung nodule. The recent CT scan of the chest showed no evidence for disease progression but the patient has new and progressive right worse than left pulmonary opacity most consistent with his recent diagnosis of pneumonia.  I discussed the scan results with the patient and his wife. I recommended for him to continue on observation with repeat CT scan of the chest without contrast in 6 months. He was advised to call immediately if he has any concerning symptoms in the interval. The patient voices understanding of current disease status and treatment options and is in agreement with the current care plan.  All questions were answered. The patient knows to call the clinic with any problems, questions or concerns. We can certainly see the patient much sooner if necessary.  Disclaimer: This note was dictated with voice recognition software. Similar sounding words can inadvertently be transcribed and may not be corrected upon review.

## 2014-09-20 DIAGNOSIS — I10 Essential (primary) hypertension: Secondary | ICD-10-CM | POA: Diagnosis not present

## 2014-09-20 DIAGNOSIS — K219 Gastro-esophageal reflux disease without esophagitis: Secondary | ICD-10-CM | POA: Diagnosis not present

## 2014-09-20 DIAGNOSIS — J440 Chronic obstructive pulmonary disease with acute lower respiratory infection: Secondary | ICD-10-CM | POA: Diagnosis not present

## 2014-09-20 DIAGNOSIS — F419 Anxiety disorder, unspecified: Secondary | ICD-10-CM | POA: Diagnosis not present

## 2014-09-20 DIAGNOSIS — I480 Paroxysmal atrial fibrillation: Secondary | ICD-10-CM | POA: Diagnosis not present

## 2014-09-20 DIAGNOSIS — J189 Pneumonia, unspecified organism: Secondary | ICD-10-CM | POA: Diagnosis not present

## 2014-09-26 DIAGNOSIS — I480 Paroxysmal atrial fibrillation: Secondary | ICD-10-CM | POA: Diagnosis not present

## 2014-09-26 DIAGNOSIS — K219 Gastro-esophageal reflux disease without esophagitis: Secondary | ICD-10-CM | POA: Diagnosis not present

## 2014-09-26 DIAGNOSIS — J189 Pneumonia, unspecified organism: Secondary | ICD-10-CM | POA: Diagnosis not present

## 2014-09-26 DIAGNOSIS — J440 Chronic obstructive pulmonary disease with acute lower respiratory infection: Secondary | ICD-10-CM | POA: Diagnosis not present

## 2014-09-26 DIAGNOSIS — I10 Essential (primary) hypertension: Secondary | ICD-10-CM | POA: Diagnosis not present

## 2014-09-26 DIAGNOSIS — F419 Anxiety disorder, unspecified: Secondary | ICD-10-CM | POA: Diagnosis not present

## 2014-09-28 DIAGNOSIS — E785 Hyperlipidemia, unspecified: Secondary | ICD-10-CM | POA: Diagnosis not present

## 2014-09-28 DIAGNOSIS — F419 Anxiety disorder, unspecified: Secondary | ICD-10-CM | POA: Diagnosis not present

## 2014-09-28 DIAGNOSIS — J189 Pneumonia, unspecified organism: Secondary | ICD-10-CM | POA: Diagnosis not present

## 2014-09-28 DIAGNOSIS — Z85118 Personal history of other malignant neoplasm of bronchus and lung: Secondary | ICD-10-CM | POA: Diagnosis not present

## 2014-09-28 DIAGNOSIS — I10 Essential (primary) hypertension: Secondary | ICD-10-CM | POA: Diagnosis not present

## 2014-09-28 DIAGNOSIS — I480 Paroxysmal atrial fibrillation: Secondary | ICD-10-CM | POA: Diagnosis not present

## 2014-09-28 DIAGNOSIS — J440 Chronic obstructive pulmonary disease with acute lower respiratory infection: Secondary | ICD-10-CM | POA: Diagnosis not present

## 2014-09-28 DIAGNOSIS — K219 Gastro-esophageal reflux disease without esophagitis: Secondary | ICD-10-CM | POA: Diagnosis not present

## 2014-09-28 DIAGNOSIS — Z87891 Personal history of nicotine dependence: Secondary | ICD-10-CM | POA: Diagnosis not present

## 2014-10-05 DIAGNOSIS — K219 Gastro-esophageal reflux disease without esophagitis: Secondary | ICD-10-CM | POA: Diagnosis not present

## 2014-10-05 DIAGNOSIS — F419 Anxiety disorder, unspecified: Secondary | ICD-10-CM | POA: Diagnosis not present

## 2014-10-05 DIAGNOSIS — J440 Chronic obstructive pulmonary disease with acute lower respiratory infection: Secondary | ICD-10-CM | POA: Diagnosis not present

## 2014-10-05 DIAGNOSIS — I480 Paroxysmal atrial fibrillation: Secondary | ICD-10-CM | POA: Diagnosis not present

## 2014-10-05 DIAGNOSIS — I10 Essential (primary) hypertension: Secondary | ICD-10-CM | POA: Diagnosis not present

## 2014-10-05 DIAGNOSIS — J189 Pneumonia, unspecified organism: Secondary | ICD-10-CM | POA: Diagnosis not present

## 2014-10-10 DIAGNOSIS — F419 Anxiety disorder, unspecified: Secondary | ICD-10-CM | POA: Diagnosis not present

## 2014-10-10 DIAGNOSIS — K219 Gastro-esophageal reflux disease without esophagitis: Secondary | ICD-10-CM | POA: Diagnosis not present

## 2014-10-10 DIAGNOSIS — J189 Pneumonia, unspecified organism: Secondary | ICD-10-CM | POA: Diagnosis not present

## 2014-10-10 DIAGNOSIS — I10 Essential (primary) hypertension: Secondary | ICD-10-CM | POA: Diagnosis not present

## 2014-10-10 DIAGNOSIS — I480 Paroxysmal atrial fibrillation: Secondary | ICD-10-CM | POA: Diagnosis not present

## 2014-10-10 DIAGNOSIS — J440 Chronic obstructive pulmonary disease with acute lower respiratory infection: Secondary | ICD-10-CM | POA: Diagnosis not present

## 2014-10-20 DIAGNOSIS — J189 Pneumonia, unspecified organism: Secondary | ICD-10-CM | POA: Diagnosis not present

## 2014-10-20 DIAGNOSIS — I10 Essential (primary) hypertension: Secondary | ICD-10-CM | POA: Diagnosis not present

## 2014-10-20 DIAGNOSIS — K219 Gastro-esophageal reflux disease without esophagitis: Secondary | ICD-10-CM | POA: Diagnosis not present

## 2014-10-20 DIAGNOSIS — I480 Paroxysmal atrial fibrillation: Secondary | ICD-10-CM | POA: Diagnosis not present

## 2014-10-20 DIAGNOSIS — J440 Chronic obstructive pulmonary disease with acute lower respiratory infection: Secondary | ICD-10-CM | POA: Diagnosis not present

## 2014-10-20 DIAGNOSIS — F419 Anxiety disorder, unspecified: Secondary | ICD-10-CM | POA: Diagnosis not present

## 2014-10-24 DIAGNOSIS — I10 Essential (primary) hypertension: Secondary | ICD-10-CM | POA: Diagnosis not present

## 2014-10-24 DIAGNOSIS — J189 Pneumonia, unspecified organism: Secondary | ICD-10-CM | POA: Diagnosis not present

## 2014-10-24 DIAGNOSIS — I480 Paroxysmal atrial fibrillation: Secondary | ICD-10-CM | POA: Diagnosis not present

## 2014-10-24 DIAGNOSIS — K219 Gastro-esophageal reflux disease without esophagitis: Secondary | ICD-10-CM | POA: Diagnosis not present

## 2014-10-24 DIAGNOSIS — J440 Chronic obstructive pulmonary disease with acute lower respiratory infection: Secondary | ICD-10-CM | POA: Diagnosis not present

## 2014-10-24 DIAGNOSIS — F419 Anxiety disorder, unspecified: Secondary | ICD-10-CM | POA: Diagnosis not present

## 2014-11-01 DIAGNOSIS — K219 Gastro-esophageal reflux disease without esophagitis: Secondary | ICD-10-CM | POA: Diagnosis not present

## 2014-11-01 DIAGNOSIS — I10 Essential (primary) hypertension: Secondary | ICD-10-CM | POA: Diagnosis not present

## 2014-11-01 DIAGNOSIS — F419 Anxiety disorder, unspecified: Secondary | ICD-10-CM | POA: Diagnosis not present

## 2014-11-01 DIAGNOSIS — I480 Paroxysmal atrial fibrillation: Secondary | ICD-10-CM | POA: Diagnosis not present

## 2014-11-01 DIAGNOSIS — J189 Pneumonia, unspecified organism: Secondary | ICD-10-CM | POA: Diagnosis not present

## 2014-11-01 DIAGNOSIS — J440 Chronic obstructive pulmonary disease with acute lower respiratory infection: Secondary | ICD-10-CM | POA: Diagnosis not present

## 2014-11-07 DIAGNOSIS — Z681 Body mass index (BMI) 19 or less, adult: Secondary | ICD-10-CM | POA: Diagnosis not present

## 2014-11-07 DIAGNOSIS — J449 Chronic obstructive pulmonary disease, unspecified: Secondary | ICD-10-CM | POA: Diagnosis not present

## 2014-11-07 DIAGNOSIS — E782 Mixed hyperlipidemia: Secondary | ICD-10-CM | POA: Diagnosis not present

## 2014-11-07 DIAGNOSIS — Z1389 Encounter for screening for other disorder: Secondary | ICD-10-CM | POA: Diagnosis not present

## 2014-11-07 DIAGNOSIS — E039 Hypothyroidism, unspecified: Secondary | ICD-10-CM | POA: Diagnosis not present

## 2014-11-07 DIAGNOSIS — R7309 Other abnormal glucose: Secondary | ICD-10-CM | POA: Diagnosis not present

## 2014-11-09 DIAGNOSIS — I480 Paroxysmal atrial fibrillation: Secondary | ICD-10-CM | POA: Diagnosis not present

## 2014-11-09 DIAGNOSIS — K219 Gastro-esophageal reflux disease without esophagitis: Secondary | ICD-10-CM | POA: Diagnosis not present

## 2014-11-09 DIAGNOSIS — F419 Anxiety disorder, unspecified: Secondary | ICD-10-CM | POA: Diagnosis not present

## 2014-11-09 DIAGNOSIS — J440 Chronic obstructive pulmonary disease with acute lower respiratory infection: Secondary | ICD-10-CM | POA: Diagnosis not present

## 2014-11-09 DIAGNOSIS — I10 Essential (primary) hypertension: Secondary | ICD-10-CM | POA: Diagnosis not present

## 2014-11-09 DIAGNOSIS — J189 Pneumonia, unspecified organism: Secondary | ICD-10-CM | POA: Diagnosis not present

## 2014-11-14 DIAGNOSIS — Z961 Presence of intraocular lens: Secondary | ICD-10-CM | POA: Diagnosis not present

## 2014-11-16 DIAGNOSIS — I10 Essential (primary) hypertension: Secondary | ICD-10-CM | POA: Diagnosis not present

## 2014-11-16 DIAGNOSIS — K219 Gastro-esophageal reflux disease without esophagitis: Secondary | ICD-10-CM | POA: Diagnosis not present

## 2014-11-16 DIAGNOSIS — J189 Pneumonia, unspecified organism: Secondary | ICD-10-CM | POA: Diagnosis not present

## 2014-11-16 DIAGNOSIS — F419 Anxiety disorder, unspecified: Secondary | ICD-10-CM | POA: Diagnosis not present

## 2014-11-16 DIAGNOSIS — I480 Paroxysmal atrial fibrillation: Secondary | ICD-10-CM | POA: Diagnosis not present

## 2014-11-16 DIAGNOSIS — J440 Chronic obstructive pulmonary disease with acute lower respiratory infection: Secondary | ICD-10-CM | POA: Diagnosis not present

## 2014-11-17 DIAGNOSIS — J449 Chronic obstructive pulmonary disease, unspecified: Secondary | ICD-10-CM | POA: Diagnosis not present

## 2014-11-24 DIAGNOSIS — I480 Paroxysmal atrial fibrillation: Secondary | ICD-10-CM | POA: Diagnosis not present

## 2014-11-24 DIAGNOSIS — F419 Anxiety disorder, unspecified: Secondary | ICD-10-CM | POA: Diagnosis not present

## 2014-11-24 DIAGNOSIS — K219 Gastro-esophageal reflux disease without esophagitis: Secondary | ICD-10-CM | POA: Diagnosis not present

## 2014-11-24 DIAGNOSIS — I10 Essential (primary) hypertension: Secondary | ICD-10-CM | POA: Diagnosis not present

## 2014-11-24 DIAGNOSIS — J440 Chronic obstructive pulmonary disease with acute lower respiratory infection: Secondary | ICD-10-CM | POA: Diagnosis not present

## 2014-11-24 DIAGNOSIS — J189 Pneumonia, unspecified organism: Secondary | ICD-10-CM | POA: Diagnosis not present

## 2015-02-14 ENCOUNTER — Encounter: Payer: Self-pay | Admitting: *Deleted

## 2015-02-14 ENCOUNTER — Telehealth: Payer: Self-pay | Admitting: Internal Medicine

## 2015-02-14 NOTE — Telephone Encounter (Signed)
lvm for pt regarding to Jan 2017 appt.Marland KitchenMarland KitchenMarland KitchenMarland Kitchenpt ok and aware

## 2015-02-23 ENCOUNTER — Telehealth: Payer: Self-pay | Admitting: *Deleted

## 2015-02-23 NOTE — Telephone Encounter (Signed)
  Oncology Nurse Navigator Documentation    Navigator Encounter Type: Telephone (02/23/15 1230)     Barriers/Navigation Needs: No barriers at this time (02/23/15 1230)   Interventions: None required (02/23/15 1230)  I called patient to check in.  He reports that he is doing pretty well.  He continues to use oxygen at night.  He denies any questions or concerns at this time.  We reviewed his upcoming appointments in January.  I encouraged him to call me with any needs.         Time Spent with Patient: 15 (02/23/15 1230)

## 2015-03-08 ENCOUNTER — Other Ambulatory Visit: Payer: Self-pay | Admitting: Cardiovascular Disease

## 2015-03-12 ENCOUNTER — Other Ambulatory Visit: Payer: Medicare Other

## 2015-03-12 ENCOUNTER — Other Ambulatory Visit: Payer: Self-pay | Admitting: *Deleted

## 2015-03-12 ENCOUNTER — Ambulatory Visit (HOSPITAL_COMMUNITY): Payer: Medicare Other

## 2015-03-13 ENCOUNTER — Telehealth: Payer: Self-pay | Admitting: Internal Medicine

## 2015-03-13 NOTE — Telephone Encounter (Signed)
Aware of added lab 1/12

## 2015-03-15 ENCOUNTER — Other Ambulatory Visit (HOSPITAL_BASED_OUTPATIENT_CLINIC_OR_DEPARTMENT_OTHER): Payer: Medicare Other

## 2015-03-15 ENCOUNTER — Ambulatory Visit (HOSPITAL_COMMUNITY)
Admission: RE | Admit: 2015-03-15 | Discharge: 2015-03-15 | Disposition: A | Discharge status: Home / Self Care | Payer: Medicare Other | Source: Ambulatory Visit | Attending: Internal Medicine | Admitting: Internal Medicine

## 2015-03-15 ENCOUNTER — Encounter (HOSPITAL_COMMUNITY): Payer: Self-pay

## 2015-03-15 DIAGNOSIS — I2510 Atherosclerotic heart disease of native coronary artery without angina pectoris: Secondary | ICD-10-CM | POA: Diagnosis not present

## 2015-03-15 DIAGNOSIS — C3411 Malignant neoplasm of upper lobe, right bronchus or lung: Secondary | ICD-10-CM | POA: Diagnosis not present

## 2015-03-15 DIAGNOSIS — J439 Emphysema, unspecified: Secondary | ICD-10-CM | POA: Insufficient documentation

## 2015-03-15 DIAGNOSIS — Z923 Personal history of irradiation: Secondary | ICD-10-CM | POA: Diagnosis not present

## 2015-03-15 DIAGNOSIS — R918 Other nonspecific abnormal finding of lung field: Secondary | ICD-10-CM | POA: Diagnosis not present

## 2015-03-15 LAB — COMPREHENSIVE METABOLIC PANEL
ALBUMIN: 3.9 g/dL (ref 3.5–5.0)
ALK PHOS: 92 U/L (ref 40–150)
ALT: 10 U/L (ref 0–55)
AST: 22 U/L (ref 5–34)
Anion Gap: 10 mEq/L (ref 3–11)
BILIRUBIN TOTAL: 0.66 mg/dL (ref 0.20–1.20)
BUN: 12.9 mg/dL (ref 7.0–26.0)
CALCIUM: 9.5 mg/dL (ref 8.4–10.4)
CO2: 31 mEq/L — ABNORMAL HIGH (ref 22–29)
Chloride: 96 mEq/L — ABNORMAL LOW (ref 98–109)
Creatinine: 1 mg/dL (ref 0.7–1.3)
EGFR: 73 mL/min/{1.73_m2} — ABNORMAL LOW (ref >90)
GLUCOSE: 96 mg/dL (ref 70–140)
Potassium: 4.9 mEq/L (ref 3.5–5.1)
SODIUM: 137 meq/L (ref 136–145)
Total Protein: 7.4 g/dL (ref 6.4–8.3)

## 2015-03-15 LAB — CBC WITH DIFFERENTIAL/PLATELET
BASO%: 0.6 % (ref 0.0–2.0)
Basophils Absolute: 0 10*3/uL (ref 0.0–0.1)
EOS%: 2.1 % (ref 0.0–7.0)
Eosinophils Absolute: 0.1 10*3/uL (ref 0.0–0.5)
HCT: 45.5 % (ref 38.4–49.9)
HGB: 14.9 g/dL (ref 13.0–17.1)
LYMPH%: 23.2 % (ref 14.0–49.0)
MCH: 29.7 pg (ref 27.2–33.4)
MCHC: 32.8 g/dL (ref 32.0–36.0)
MCV: 90.5 fL (ref 79.3–98.0)
MONO#: 0.7 10*3/uL (ref 0.1–0.9)
MONO%: 9.9 % (ref 0.0–14.0)
NEUT#: 4.5 10*3/uL (ref 1.5–6.5)
NEUT%: 64.2 % (ref 39.0–75.0)
Platelets: 180 10*3/uL (ref 140–400)
RBC: 5.03 10*6/uL (ref 4.20–5.82)
RDW: 13.1 % (ref 11.0–14.6)
WBC: 7.1 10*3/uL (ref 4.0–10.3)
lymph#: 1.6 10*3/uL (ref 0.9–3.3)

## 2015-03-15 MED ORDER — IOHEXOL 300 MG/ML  SOLN
75.0000 mL | Freq: Once | INTRAMUSCULAR | Status: AC | PRN
  Administered 2015-03-15: 75 mL via INTRAVENOUS

## 2015-03-19 ENCOUNTER — Other Ambulatory Visit: Payer: Medicare Other

## 2015-03-21 ENCOUNTER — Ambulatory Visit (HOSPITAL_BASED_OUTPATIENT_CLINIC_OR_DEPARTMENT_OTHER): Payer: Medicare Other | Admitting: Internal Medicine

## 2015-03-21 ENCOUNTER — Encounter: Payer: Self-pay | Admitting: Internal Medicine

## 2015-03-21 ENCOUNTER — Encounter: Payer: Self-pay | Admitting: *Deleted

## 2015-03-21 ENCOUNTER — Telehealth: Payer: Self-pay | Admitting: Internal Medicine

## 2015-03-21 VITALS — BP 166/80 | HR 75 | Temp 98.5°F | Resp 18 | Ht 73.0 in | Wt 160.8 lb

## 2015-03-21 DIAGNOSIS — C3411 Malignant neoplasm of upper lobe, right bronchus or lung: Secondary | ICD-10-CM | POA: Diagnosis not present

## 2015-03-21 NOTE — Progress Notes (Signed)
  Oncology Nurse Navigator Documentation  Navigator Location: [facility] (03/21/15 1245) Navigator Encounter Type: Follow-up Appt (03/21/15 1245)  Patient at [facility] for routine follow up appointment.  He reports that he is doing well.  He has gained some weight since his last appointment.  He has been drinking Boost every morning.  He reports that he is a little unsteady when he walks.  His wife reports that he has a cane and walker for assistance but he does not use them.  We discussed the importance of being safe to avoid a fall.  He denies any problems with his breathing.  He denies any questions or concerns at this time.  I encouraged him to call me whenever needed.          Patient Visit Type: Follow-up;MedOnc (03/21/15 1245)   Barriers/Navigation Needs: No barriers at this time (03/21/15 1245)   Interventions: None required (03/21/15 1245)            Acuity: Level 1 (03/21/15 1245) Acuity Level 1: Minimal follow up required (03/21/15 1245)       Time Spent with Patient: 15 (03/21/15 1245)

## 2015-03-21 NOTE — Progress Notes (Signed)
[place] [phone]   Fax:([phone]) [place], [place] [address] [place] [address]  DIAGNOSIS: Unresectable he stage IIB (T3, N0, M0) non-small cell lung cancer, adenocarcinoma with positive EGFR amplification (G598V, G719A) diagnosed in March of 2015.  PRIOR THERAPY: Stereotactic radiotherapy to the right upper lobe pulmonary nodules under the care of Dr. Tammi Klippel completed on 07/26/2013.  CURRENT THERAPY: Observation.  INTERVAL HISTORY: Adam Ward 80 y.o. male returns to the clinic today for 6 months followup visit accompanied by his wife. The patient is feeling fine today with no specific complaints except for mild fatigue. He gained several pounds recently. He denied having any significant chest pain but has shortness of breath with exertion with cough or hemoptysis. The patient denied having any significant weight loss or night sweats. He denied having any fever or chills, no nausea or vomiting. He had repeat CT scan of the chest performed recently and he is here for evaluation and discussion of his scan results.  MEDICAL HISTORY: Past Medical History  Diagnosis Date  . Hypertension   . High cholesterol   . Anxiety   . Rapid atrial fibrillation ([place]) 07/28/2011    Paroxysmal, newly diagnosed.  . Cavitary lesion of lung 07/28/2011    AFB smears x3 negative.  . Acute respiratory failure with hypoxia ([place]) 07/26/2011  . [place] of lung ([place]) 07/28/2011  . Tobacco abuse 08/01/2011  . COPD (chronic obstructive pulmonary disease) ([place])   . Pneumonia   . Numbness and tingling     Hx: of in right hand and B/LLE  . GERD (gastroesophageal reflux disease)     Hx: of  . Headache(784.0)   . Colon cancer ([place]) 1993  . Lung cancer ([place])     ALLERGIES:  is allergic to celebrex and tetracyclines & related.  MEDICATIONS:  Current Outpatient Prescriptions  Medication Sig Dispense Refill  . alprazolam (XANAX)  2 MG tablet Take 2 mg by mouth 3 (three) times daily.    Marland Kitchen aspirin EC 81 MG tablet Take 81 mg by mouth daily.    . digoxin (LANOXIN) 0.125 MG tablet Take 1 tablet by mouth daily.    Marland Kitchen docusate sodium (COLACE) 100 MG capsule Take 100-200 mg by mouth daily as needed for mild constipation or moderate constipation.     Marland Kitchen levothyroxine (SYNTHROID, LEVOTHROID) 50 MCG tablet Take 50 mcg by mouth daily before breakfast.    . metoprolol tartrate (LOPRESSOR) 25 MG tablet TAKE (1) TABLET BY MOUTH TWICE DAILY. 60 tablet 0  . Multiple Vitamins-Minerals (MULTIVITAMIN WITH MINERALS) tablet Take 1 tablet by mouth daily.    . Omega-3 Fatty Acids (FISH OIL) 1000 MG CAPS Take 1 capsule by mouth daily.    Marland Kitchen PROAIR HFA 108 (90 BASE) MCG/ACT inhaler Inhale 1 puff into the lungs every 4 (four) hours as needed for wheezing or shortness of breath.     . Saw Palmetto, Serenoa repens, (SAW PALMETTO BERRIES PO) Take 2 capsules by mouth daily.    . simvastatin (ZOCOR) 20 MG tablet Take 20 mg by mouth daily.     No current facility-administered medications for this visit.   Facility-Administered Medications Ordered in Other Visits  Medication Dose Route Frequency Provider Last Rate Last Dose  . heparin lock flush 100 unit/mL  500 Units Intravenous Once Curt Bears, MD   500 Units at 06/15/13 1316  . sodium chloride 0.9 % injection 10 mL  10  mL Intravenous PRN Curt Bears, MD   10 mL at 06/15/13 1317    SURGICAL HISTORY:  Past Surgical History  Procedure Laterality Date  . Colon surgery    . Cervical disc surgery    . Tonsillectomy    . Appendectomy    . Eye surgery      as a child  . Video bronchoscopy with endobronchial navigation N/A 05/18/2013    Procedure: VIDEO BRONCHOSCOPY WITH ENDOBRONCHIAL NAVIGATION;  Surgeon: [name], MD;  Location: [facility] OR;  Service: Thoracic;  Laterality: N/A;    REVIEW OF SYSTEMS:  A comprehensive review of systems was negative except for: Constitutional: positive for  fatigue Respiratory: positive for dyspnea on exertion   PHYSICAL EXAMINATION: General appearance: alert, cooperative and no distress Head: Normocephalic, without obvious abnormality, atraumatic Neck: no adenopathy, no JVD, supple, symmetrical, trachea midline and thyroid not enlarged, symmetric, no tenderness/mass/nodules Lymph nodes: Cervical, supraclavicular, and axillary nodes normal. Resp: clear to auscultation bilaterally Back: symmetric, no curvature. ROM normal. No CVA tenderness. Cardio: regular rate and rhythm, S1, S2 normal, no murmur, click, rub or gallop GI: soft, non-tender; bowel sounds normal; no masses,  no organomegaly Extremities: extremities normal, atraumatic, no cyanosis or edema Neurologic: Alert and oriented X 3, normal strength and tone. Normal symmetric reflexes. Normal coordination and gait  ECOG PERFORMANCE STATUS: 1 - Symptomatic but completely ambulatory  Blood pressure 166/80, pulse 75, temperature 98.5 F (36.9 C), temperature source Oral, resp. rate 18, height '6\' 1"'  (1.854 m), weight 160 lb 12.8 oz (72.938 kg), SpO2 95 %.  LABORATORY DATA: Lab Results  Component Value Date   WBC 7.1 03/15/2015   HGB 14.9 03/15/2015   HCT 45.5 03/15/2015   MCV 90.5 03/15/2015   PLT 180 03/15/2015      Chemistry      Component Value Date/Time   NA 137 03/15/2015 1000   NA 140 07/26/2014 0212   K 4.9 03/15/2015 1000   K 3.6 07/26/2014 0212   CL 104 07/26/2014 0212   CO2 31* 03/15/2015 1000   CO2 30 07/26/2014 0212   BUN 12.9 03/15/2015 1000   BUN 17 07/26/2014 0212   CREATININE 1.0 03/15/2015 1000   CREATININE 1.14 07/26/2014 0212   CREATININE 0.88 01/04/2013 1627      Component Value Date/Time   CALCIUM 9.5 03/15/2015 1000   CALCIUM 7.3* 07/26/2014 0212   ALKPHOS 92 03/15/2015 1000   ALKPHOS 96 05/18/2013 0919   AST 22 03/15/2015 1000   AST 18 05/18/2013 0919   ALT 10 03/15/2015 1000   ALT 9 05/18/2013 0919   BILITOT 0.66 03/15/2015 1000   BILITOT  0.4 05/18/2013 0919       RADIOGRAPHIC STUDIES: Ct Chest W Contrast  03/15/2015  CLINICAL DATA:  80 year old male with history of right-sided lung cancer diagnosed in March 2015. Radiation therapy completed in 2015. Additional history of colon cancer originally diagnosed in 62. EXAM: CT CHEST WITH CONTRAST TECHNIQUE: Multidetector CT imaging of the chest was performed during intravenous contrast administration. CONTRAST:  40m OMNIPAQUE IOHEXOL 300 MG/ML  SOLN COMPARISON:  Chest CT 09/12/2014. FINDINGS: Mediastinum/Lymph Nodes: Heart size is normal. There is no significant pericardial fluid, thickening or pericardial calcification. There is atherosclerosis of the thoracic aorta, the great vessels of the mediastinum and the coronary arteries, including calcified atherosclerotic plaque in the left anterior descending coronary artery. No pathologically enlarged mediastinal or hilar lymph nodes. Esophagus is unremarkable in appearance. No axillary lymphadenopathy. Lungs/Pleura: Again noted is an  area of architectural distortion in the periphery of the right upper lobe which appears very similar to the prior examination, most compatible with evolving postradiation fibrosis. There are several somewhat ill-defined nodular densities scattered throughout the lungs bilaterally, similar to prior examinations. Largest of these is a 9 mm nodule in the anterior aspect of the right upper lobe (image 26 of series 5). Other ill-defined ground-glass attenuation nodules are also noted in the medial left upper lobe (image 27 of series 5), and in the right lower lobe on images 41 and 48 of series 5. The largest of these is currently 20 x 25 mm (image 41 of series 5) in the right lower lobe, and has no central solid component. There were some scattered areas of peribronchovascular micronodularity in the lateral segment of the right middle lobe and in the basal segments of the right lower lobe on the prior study which have  resolved, presumably infectious or inflammatory on the prior examination. Mild diffuse bronchial wall thickening with mild centrilobular and paraseptal emphysema. No acute consolidative airspace disease. No pleural effusions. Upper Abdomen: Calcified hepatoduodenal and portacaval lymph nodes incidentally noted, similar to prior examinations. Atherosclerosis in the visualized abdominal vasculature. Musculoskeletal/Soft Tissues: There are no aggressive appearing lytic or blastic lesions noted in the visualized portions of the skeleton. Orthopedic fixation hardware in the lower cervical spine incidentally noted. IMPRESSION: 1. Stable postradiation changes in the right upper lobe, similar to the prior examination. 2. 9 mm right upper lobe pulmonary nodule and scattered ground-glass attenuation nodules in the lungs bilaterally are similar to the prior examination and warrant continued attention on future followup studies. 3. Other previously noted peribronchovascular micronodules in the right lung have resolved compared to the prior study, and presumably infectious or inflammatory on the recent prior examination. 4. Mild diffuse bronchial wall thickening with mild centrilobular and paraseptal emphysema; imaging findings suggestive of underlying COPD. 5. Atherosclerosis, including left anterior descending coronary artery disease. Assessment for potential risk factor modification, dietary therapy or pharmacologic therapy may be warranted, if clinically indicated. Electronically Signed   By: Vinnie Langton M.D.   On: 03/15/2015 13:20   ASSESSMENT AND PLAN: This is a very pleasant 80 years old white male recently diagnosed with unresectable stage IIB non-small cell lung cancer, adenocarcinoma, with EGFR amplification, with 2 separate lesions in the right upper lobe and questionable lesion in the left upper lobe. He is status post stereotactic radiotherapy to the right lung nodule. The recent CT scan of the chest showed  no evidence for disease progression.  I discussed the scan results with the patient and his wife. I recommended for him to continue on observation with repeat CT scan of the chest without contrast in 6 months. He was advised to call immediately if he has any concerning symptoms in the interval. The patient voices understanding of current disease status and treatment options and is in agreement with the current care plan.  All questions were answered. The patient knows to call the clinic with any problems, questions or concerns. We can certainly see the patient much sooner if necessary.  Disclaimer: This note was dictated with voice recognition software. Similar sounding words can inadvertently be transcribed and may not be corrected upon review.

## 2015-03-21 NOTE — Telephone Encounter (Signed)
Gave patient avs report and appointments for July. [facility] will call re ct - patient aware.

## 2015-05-07 ENCOUNTER — Other Ambulatory Visit: Payer: Self-pay | Admitting: Cardiovascular Disease

## 2015-05-11 IMAGING — CT CT CHEST W/O CM
3 of 4 series · 16 of 30 positions shown, 18 images · non-contrast
Comparison: 05/21/2012 and back to 07/27/2011.

CLINICAL DATA: Followup of pulmonary nodules. Ex-smoker. History of
remote colon cancer.

EXAM:
CT CHEST WITHOUT CONTRAST
TECHNIQUE: Multidetector CT imaging of the chest was performed following the
standard protocol without IV contrast.

[Series 3: chest w/o · axial · non-contrast · 0.70mm/px · z∈[-342,-52]mm · 6 of 82 slices shown, 8 images]
[im 12/82  mediastinal]
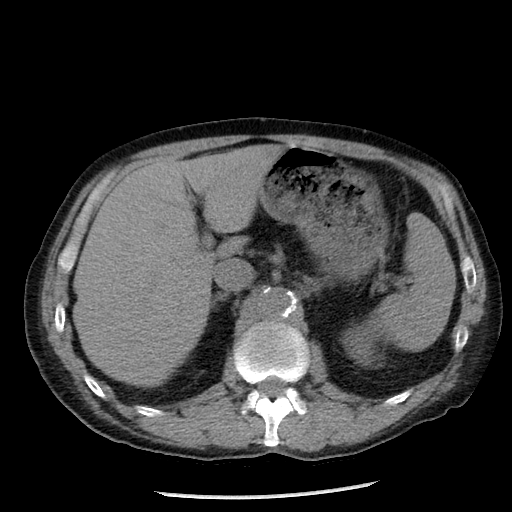
[im 12/82  lung]
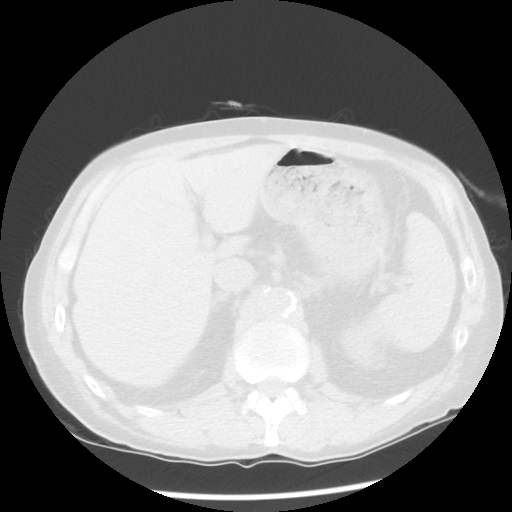
[im 24/82  lung]
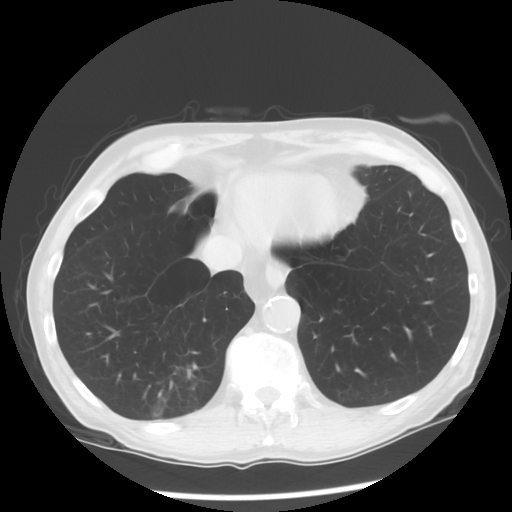
[im 35/82  lung]
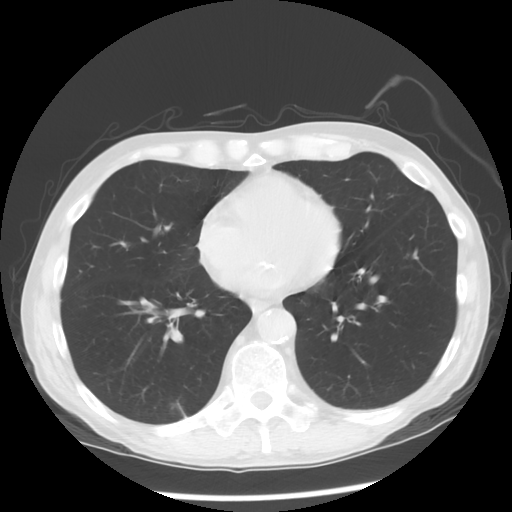
[im 47/82  lung]
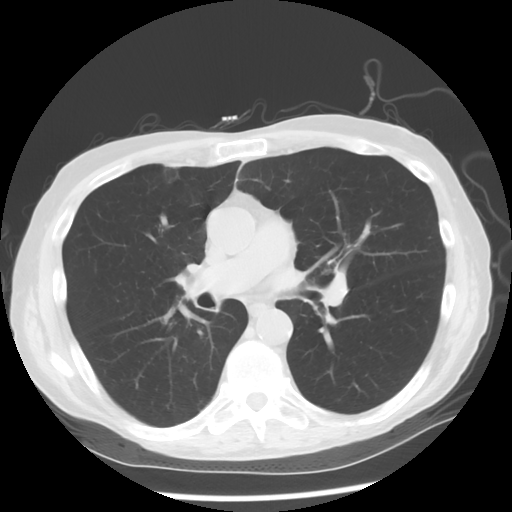
[im 58/82  mediastinal]
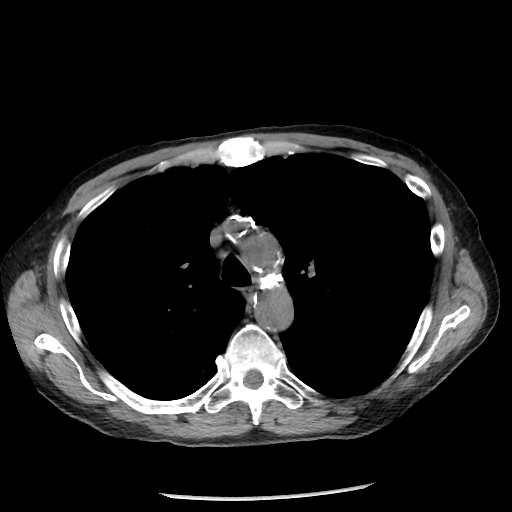
[im 58/82  lung]
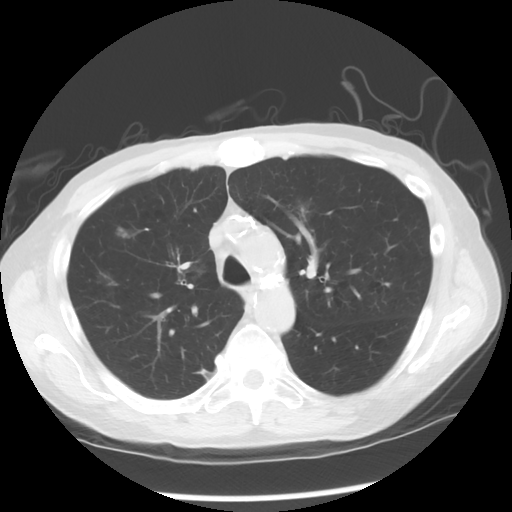
[im 70/82  lung]
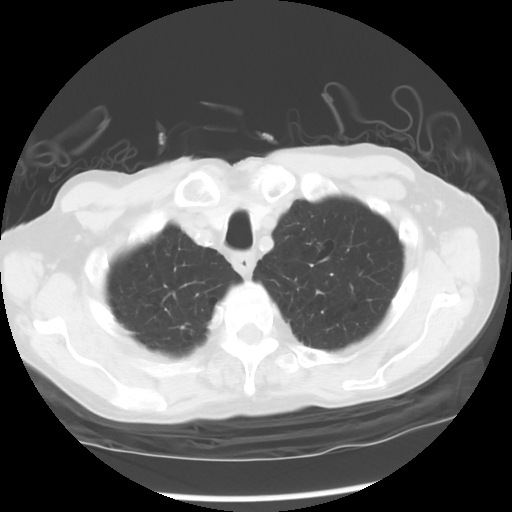

[Series 4: lung windows · axial · 0.70mm/px · z∈[-332,-62]mm · 5 of 82 slices shown]
[im 14/82  lung]
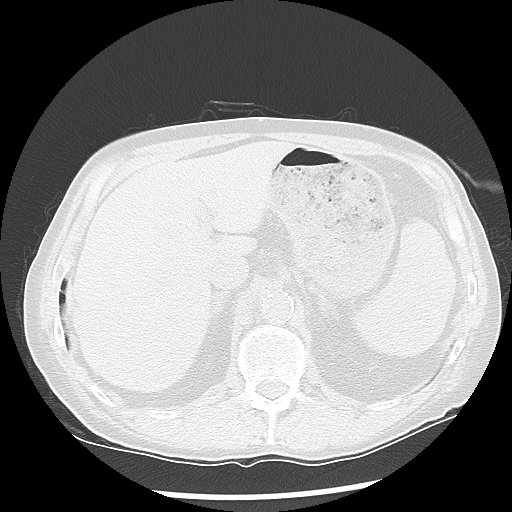
[im 28/82  lung]
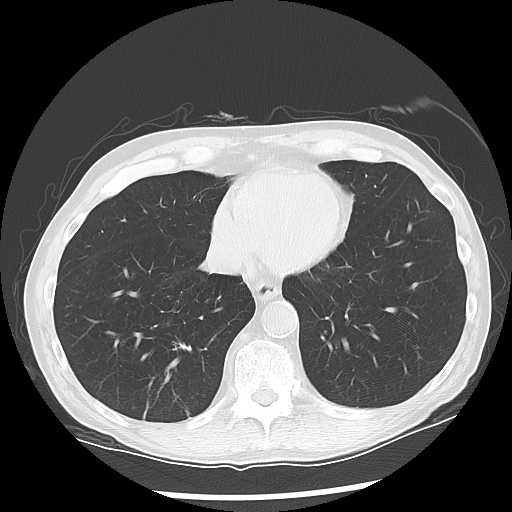
[im 41/82  lung]
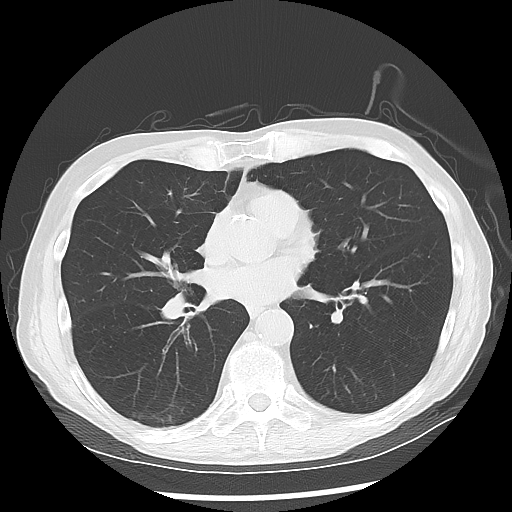
[im 55/82  lung]
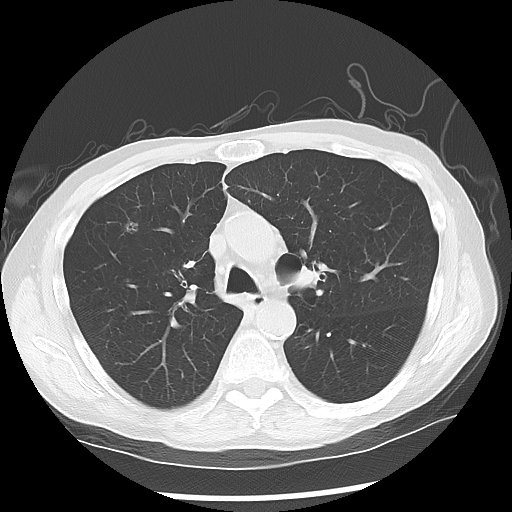
[im 68/82  lung]
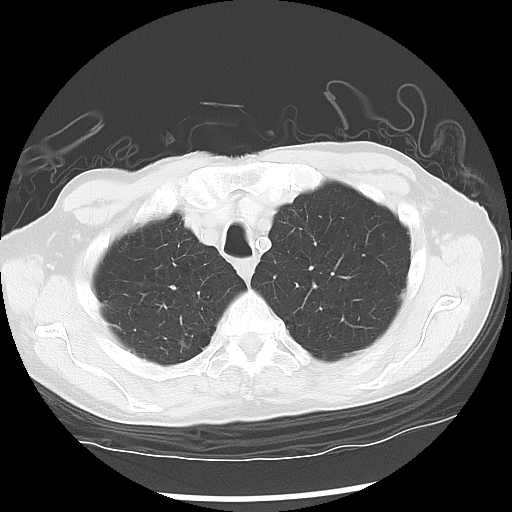

[Series 602: sagittal body · sagittal · 0.80mm/px · 5 of 145 slices shown]
[im 13/145  mediastinal]
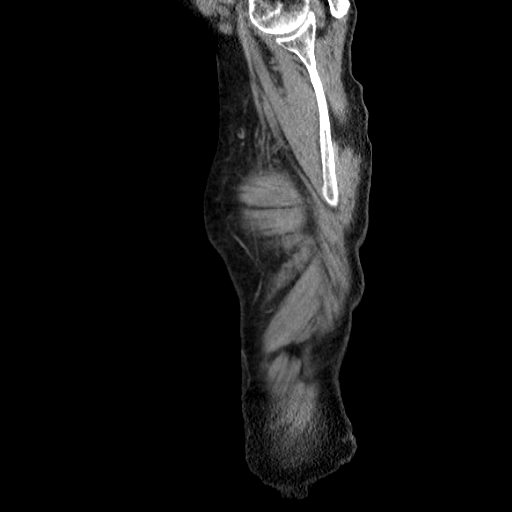
[im 37/145  mediastinal]
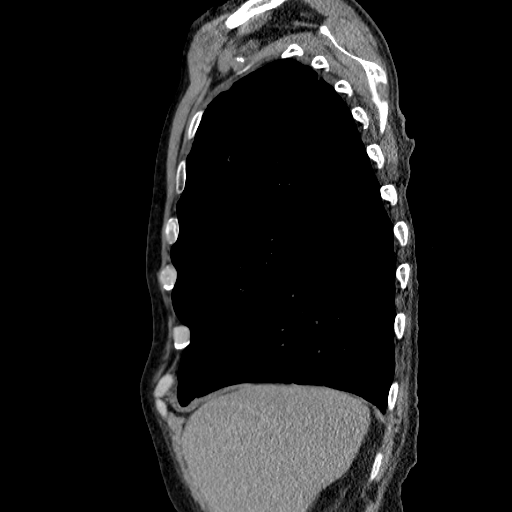
[im 49/145  mediastinal]
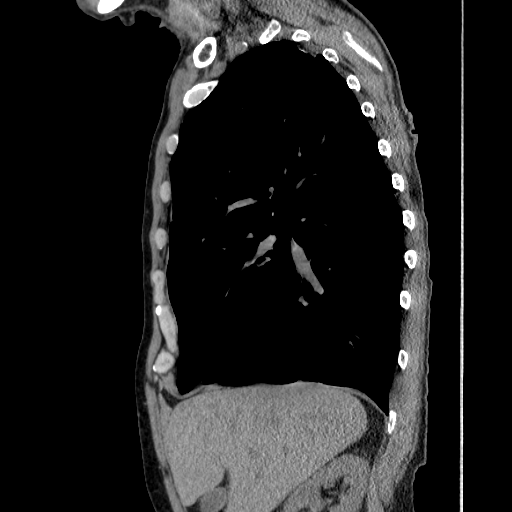
[im 61/145  mediastinal]
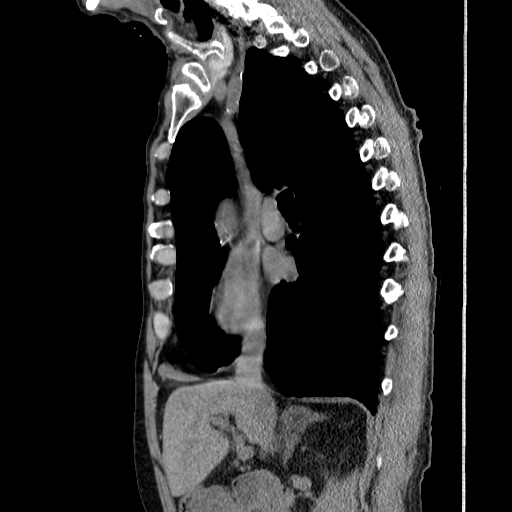
[im 85/145  mediastinal]
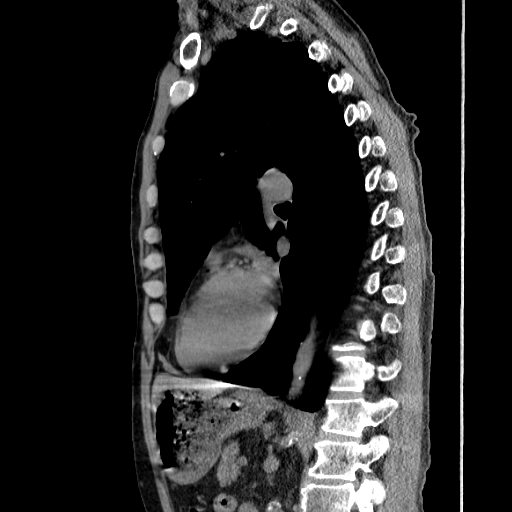

[16 of 30 positions shown; findings below may reference images not displayed]

FINDINGS: Lungs/Pleura: Moderate centrilobular emphysema. Biapical pleural
parenchymal scarring.

Peripheral right upper lobe ground-glass nodule measures 2.3 x
cm on image 23/series 4. 1.6 x 3.3 cm at the same level on the prior
exam. Appears similar on today's sagittal image 30, and grossly
similar on 01/07/2012.

Sub solid right upper lobe nodule which measures 1.0 x 1.2 cm on
image 27 versus 0.9 x 1.0 cm on the prior exam. Definitely enlarged
since 07/27/2011.

Left upper lobe central ground-glass nodule which measures on the
order of 1.3 x 1.6 cm on image 25/series 4. 1.6 x 1.7 cm on the
prior exam, suggesting stability.

Scattered areas of scarring, some of which are somewhat nodular. No
pleural fluid.

Heart/Mediastinum: Dense aortic and great vessel atherosclerosis.
Normal heart size, without pericardial effusion. Coronary artery
atherosclerosis. Small stable middle mediastinal and prevascular
nodes, without mediastinal or hilar adenopathy.

Upper abdomen: Advanced atherosclerosis. Normal adrenal glands.

Bones/Musculoskeletal: Lower cervical spine fixation. No acute
osseous abnormality.
IMPRESSION: 1. Multiple sub solid pulmonary nodules. An anterior right upper
lobe nodule has definitely enlarged when compared back to
07/27/2011. This is suspicious for an indolent neoplasm such is
low-grade adenocarcinoma. Presuming this is not sampled or resected,
recommend special attention on followup.
2. The more peripheral right upper lobe sub solid lesion measures
slightly larger today, favored to be due to differences in slice
selection. This also warrants special followup attention.
3. Atherosclerosis, including within the coronary arteries.
4. Centrilobular emphysema.

## 2015-05-17 DIAGNOSIS — Z23 Encounter for immunization: Secondary | ICD-10-CM | POA: Diagnosis not present

## 2015-05-17 DIAGNOSIS — Z6821 Body mass index (BMI) 21.0-21.9, adult: Secondary | ICD-10-CM | POA: Diagnosis not present

## 2015-05-17 DIAGNOSIS — E039 Hypothyroidism, unspecified: Secondary | ICD-10-CM | POA: Diagnosis not present

## 2015-05-17 DIAGNOSIS — Z79899 Other long term (current) drug therapy: Secondary | ICD-10-CM | POA: Diagnosis not present

## 2015-05-17 DIAGNOSIS — I10 Essential (primary) hypertension: Secondary | ICD-10-CM | POA: Diagnosis not present

## 2015-05-17 DIAGNOSIS — E782 Mixed hyperlipidemia: Secondary | ICD-10-CM | POA: Diagnosis not present

## 2015-05-17 DIAGNOSIS — Z1389 Encounter for screening for other disorder: Secondary | ICD-10-CM | POA: Diagnosis not present

## 2015-07-01 ENCOUNTER — Telehealth: Payer: Self-pay | Admitting: Internal Medicine

## 2015-07-01 NOTE — Telephone Encounter (Signed)
s.w. pt wife and advised on 7.25 moved to 7.26 due to md on call....ok and aware of new d.t

## 2015-09-18 ENCOUNTER — Other Ambulatory Visit (HOSPITAL_BASED_OUTPATIENT_CLINIC_OR_DEPARTMENT_OTHER): Payer: Medicare Other

## 2015-09-18 ENCOUNTER — Encounter (HOSPITAL_COMMUNITY): Payer: Self-pay

## 2015-09-18 ENCOUNTER — Ambulatory Visit (HOSPITAL_COMMUNITY)
Admission: RE | Admit: 2015-09-18 | Discharge: 2015-09-18 | Disposition: A | Discharge status: Home / Self Care | Payer: Medicare Other | Source: Ambulatory Visit | Attending: Internal Medicine | Admitting: Internal Medicine

## 2015-09-18 DIAGNOSIS — I2510 Atherosclerotic heart disease of native coronary artery without angina pectoris: Secondary | ICD-10-CM | POA: Diagnosis not present

## 2015-09-18 DIAGNOSIS — I700 Atherosclerosis of aorta: Secondary | ICD-10-CM | POA: Insufficient documentation

## 2015-09-18 DIAGNOSIS — J439 Emphysema, unspecified: Secondary | ICD-10-CM | POA: Insufficient documentation

## 2015-09-18 DIAGNOSIS — Y842 Radiological procedure and radiotherapy as the cause of abnormal reaction of the patient, or of later complication, without mention of misadventure at the time of the procedure: Secondary | ICD-10-CM | POA: Insufficient documentation

## 2015-09-18 DIAGNOSIS — C3491 Malignant neoplasm of unspecified part of right bronchus or lung: Secondary | ICD-10-CM | POA: Diagnosis not present

## 2015-09-18 DIAGNOSIS — C3411 Malignant neoplasm of upper lobe, right bronchus or lung: Secondary | ICD-10-CM | POA: Insufficient documentation

## 2015-09-18 LAB — COMPREHENSIVE METABOLIC PANEL
ALK PHOS: 101 U/L (ref 40–150)
ALT: 10 U/L (ref 0–55)
AST: 18 U/L (ref 5–34)
Albumin: 3.6 g/dL (ref 3.5–5.0)
Anion Gap: 7 mEq/L (ref 3–11)
BILIRUBIN TOTAL: 0.49 mg/dL (ref 0.20–1.20)
BUN: 12.7 mg/dL (ref 7.0–26.0)
CO2: 33 meq/L — AB (ref 22–29)
CREATININE: 0.9 mg/dL (ref 0.7–1.3)
Calcium: 9.1 mg/dL (ref 8.4–10.4)
Chloride: 97 mEq/L — ABNORMAL LOW (ref 98–109)
EGFR: 82 mL/min/{1.73_m2} — ABNORMAL LOW (ref >90)
GLUCOSE: 95 mg/dL (ref 70–140)
Potassium: 4.6 mEq/L (ref 3.5–5.1)
SODIUM: 137 meq/L (ref 136–145)
Total Protein: 7 g/dL (ref 6.4–8.3)

## 2015-09-18 LAB — CBC WITH DIFFERENTIAL/PLATELET
BASO%: 0.4 % (ref 0.0–2.0)
BASOS ABS: 0 10*3/uL (ref 0.0–0.1)
EOS%: 2 % (ref 0.0–7.0)
Eosinophils Absolute: 0.1 10*3/uL (ref 0.0–0.5)
HCT: 43.2 % (ref 38.4–49.9)
HGB: 14 g/dL (ref 13.0–17.1)
LYMPH%: 18.6 % (ref 14.0–49.0)
MCH: 29.1 pg (ref 27.2–33.4)
MCHC: 32.4 g/dL (ref 32.0–36.0)
MCV: 89.7 fL (ref 79.3–98.0)
MONO#: 0.6 10*3/uL (ref 0.1–0.9)
MONO%: 9 % (ref 0.0–14.0)
NEUT#: 5.1 10*3/uL (ref 1.5–6.5)
NEUT%: 70 % (ref 39.0–75.0)
Platelets: 172 10*3/uL (ref 140–400)
RBC: 4.82 10*6/uL (ref 4.20–5.82)
RDW: 13.1 % (ref 11.0–14.6)
WBC: 7.2 10*3/uL (ref 4.0–10.3)
lymph#: 1.3 10*3/uL (ref 0.9–3.3)

## 2015-09-18 MED ORDER — IOPAMIDOL (ISOVUE-300) INJECTION 61%
75.0000 mL | Freq: Once | INTRAVENOUS | Status: AC | PRN
  Administered 2015-09-18: 75 mL via INTRAVENOUS

## 2015-09-25 ENCOUNTER — Ambulatory Visit: Payer: Medicare Other | Admitting: Internal Medicine

## 2015-09-26 ENCOUNTER — Telehealth: Payer: Self-pay | Admitting: Internal Medicine

## 2015-09-26 ENCOUNTER — Encounter: Payer: Self-pay | Admitting: *Deleted

## 2015-09-26 ENCOUNTER — Ambulatory Visit (HOSPITAL_BASED_OUTPATIENT_CLINIC_OR_DEPARTMENT_OTHER): Payer: Medicare Other | Admitting: Internal Medicine

## 2015-09-26 ENCOUNTER — Encounter: Payer: Self-pay | Admitting: Internal Medicine

## 2015-09-26 VITALS — BP 160/61 | HR 80 | Temp 98.1°F | Resp 18 | Ht 73.0 in | Wt 169.8 lb

## 2015-09-26 DIAGNOSIS — C3411 Malignant neoplasm of upper lobe, right bronchus or lung: Secondary | ICD-10-CM | POA: Diagnosis not present

## 2015-09-26 NOTE — Telephone Encounter (Signed)
GAVE PT CAL & AVS

## 2015-09-26 NOTE — Progress Notes (Signed)
  Oncology Nurse Navigator Documentation  Navigator Location: [facility] (09/26/15 1400) Navigator Encounter Type: Follow-up Appt (09/26/15 1400)  Patient at [facility] accompanied by wife.  Patient here to discuss recent CT scan results with  Dr. Julien Nordmann.  Patient's wife says they received a good report.  Patient in very good spirits.  He reports he is doing pretty well.  He does sit a lot and does not walk much more than to the kitchen and bathroom.  He does have some swelling in his ankles and he reports that the swelling makes walking difficult.  He is scheduled to see his PCP to discuss possible treatment of his swelling.  We discussed how sitting with his legs dependent may contribute to his swelling and the importance of gradually increasing activity.  Patient and his wife deny any questions or concerns at this time.  I encouraged them to call me for any needs.           Treatment Phase: Follow-up (09/26/15 1400) Barriers/Navigation Needs: No barriers at this time (09/26/15 1400)   Interventions: None required (09/26/15 1400)                      Time Spent with Patient: 15 (09/26/15 1400)

## 2015-09-26 NOTE — Progress Notes (Signed)
[facility] [phone]   Fax:(336) [place], MD [address] [place] [address]  DIAGNOSIS: Unresectable he stage IIB (T3, N0, M0) non-small cell lung cancer, adenocarcinoma with positive EGFR amplification (G598V, G719A) diagnosed in March of 2015.  PRIOR THERAPY: Stereotactic radiotherapy to the right upper lobe pulmonary nodules under the care of Dr. Tammi Klippel completed on 07/26/2013.  CURRENT THERAPY: Observation.  INTERVAL HISTORY: Adam Ward 80 y.o. male returns to the clinic today for 6 months followup visit accompanied by his wife. The patient is feeling fine today with no specific complaints except for mild fatigue and swelling of his lower extremities. He is scheduled to see his primary care physician next week for reevaluation and consideration of diuretic treatment. He denied having any significant chest pain but has shortness of breath with exertion with cough or hemoptysis. The patient denied having any significant weight loss or night sweats. He denied having any fever or chills, no nausea or vomiting. He had repeat CT scan of the chest performed recently and he is here for evaluation and discussion of his scan results.  MEDICAL HISTORY: Past Medical History:  Diagnosis Date  . Acute respiratory failure with hypoxia ([place]) 07/26/2011  . Anxiety   . Bulla of lung ([place]) 07/28/2011  . Cavitary lesion of lung 07/28/2011   AFB smears x3 negative.  . Colon cancer ([place]) 1993  . COPD (chronic obstructive pulmonary disease) ([place])   . GERD (gastroesophageal reflux disease)    Hx: of  . Headache(784.0)   . High cholesterol   . Hypertension   . Lung cancer ([place])   . Numbness and tingling    Hx: of in right hand and B/LLE  . Pneumonia   . Rapid atrial fibrillation ([place]) 07/28/2011   Paroxysmal, newly diagnosed.  . Tobacco abuse 08/01/2011    ALLERGIES:  is allergic to celebrex [celecoxib] and  tetracyclines & related.  MEDICATIONS:  Current Outpatient Prescriptions  Medication Sig Dispense Refill  . alprazolam (XANAX) 2 MG tablet Take 2 mg by mouth 3 (three) times daily.    Marland Kitchen aspirin EC 81 MG tablet Take 81 mg by mouth daily.    . digoxin (LANOXIN) 0.125 MG tablet Take 1 tablet by mouth daily.    Marland Kitchen docusate sodium (COLACE) 100 MG capsule Take 100-200 mg by mouth daily as needed for mild constipation or moderate constipation.     Marland Kitchen levothyroxine (SYNTHROID, LEVOTHROID) 50 MCG tablet Take 50 mcg by mouth daily before breakfast.    . metoprolol tartrate (LOPRESSOR) 25 MG tablet TAKE (1) TABLET BY MOUTH TWICE DAILY. 30 tablet 0  . Multiple Vitamins-Minerals (MULTIVITAMIN WITH MINERALS) tablet Take 1 tablet by mouth daily.    . Omega-3 Fatty Acids (FISH OIL) 1000 MG CAPS Take 1 capsule by mouth daily.    Marland Kitchen PROAIR HFA 108 (90 BASE) MCG/ACT inhaler Inhale 1 puff into the lungs every 4 (four) hours as needed for wheezing or shortness of breath.     . Saw Palmetto, Serenoa repens, (SAW PALMETTO BERRIES PO) Take 2 capsules by mouth daily.    . simvastatin (ZOCOR) 20 MG tablet Take 20 mg by mouth daily.     No current facility-administered medications for this visit.    Facility-Administered Medications Ordered in Other Visits  Medication Dose Route Frequency Provider Last Rate Last Dose  . heparin lock flush 100 unit/mL  500 Units Intravenous Once Curt Bears, MD      .  sodium chloride 0.9 % injection 10 mL  10 mL Intravenous PRN Curt Bears, MD        SURGICAL HISTORY:  Past Surgical History:  Procedure Laterality Date  . APPENDECTOMY    . CERVICAL DISC SURGERY    . COLON SURGERY    . EYE SURGERY     as a child  . TONSILLECTOMY    . VIDEO BRONCHOSCOPY WITH ENDOBRONCHIAL NAVIGATION N/A 05/18/2013   Procedure: VIDEO BRONCHOSCOPY WITH ENDOBRONCHIAL NAVIGATION;  Surgeon: [name], MD;  Location: [facility];  Service: Thoracic;  Laterality: N/A;    REVIEW OF SYSTEMS:  A  comprehensive review of systems was negative except for: Constitutional: positive for fatigue Respiratory: positive for dyspnea on exertion Swelling of the lower extremities   PHYSICAL EXAMINATION: General appearance: alert, cooperative and no distress Head: Normocephalic, without obvious abnormality, atraumatic Neck: no adenopathy, no JVD, supple, symmetrical, trachea midline and thyroid not enlarged, symmetric, no tenderness/mass/nodules Lymph nodes: Cervical, supraclavicular, and axillary nodes normal. Resp: clear to auscultation bilaterally Back: symmetric, no curvature. ROM normal. No CVA tenderness. Cardio: regular rate and rhythm, S1, S2 normal, no murmur, click, rub or gallop GI: soft, non-tender; bowel sounds normal; no masses,  no organomegaly Extremities: extremities normal, atraumatic, no cyanosis or edema Neurologic: Alert and oriented X 3, normal strength and tone. Normal symmetric reflexes. Normal coordination and gait  ECOG PERFORMANCE STATUS: 1 - Symptomatic but completely ambulatory  Blood pressure (!) 160/61, pulse 80, temperature 98.1 F (36.7 C), temperature source Oral, resp. rate 18, height '6\' 1"'  (1.854 m), weight 169 lb 12.8 oz (77 kg), SpO2 95 %.  LABORATORY DATA: Lab Results  Component Value Date   WBC 7.2 09/18/2015   HGB 14.0 09/18/2015   HCT 43.2 09/18/2015   MCV 89.7 09/18/2015   PLT 172 09/18/2015      Chemistry      Component Value Date/Time   NA 137 09/18/2015 1245   K 4.6 09/18/2015 1245   CL 104 07/26/2014 0212   CO2 33 (H) 09/18/2015 1245   BUN 12.7 09/18/2015 1245   CREATININE 0.9 09/18/2015 1245      Component Value Date/Time   CALCIUM 9.1 09/18/2015 1245   ALKPHOS 101 09/18/2015 1245   AST 18 09/18/2015 1245   ALT 10 09/18/2015 1245   BILITOT 0.49 09/18/2015 1245       RADIOGRAPHIC STUDIES: Ct Chest W Contrast  Result Date: 09/18/2015 CLINICAL DATA:  Restaging lung cancer EXAM: CT CHEST WITH CONTRAST TECHNIQUE: Multidetector  CT imaging of the chest was performed during intravenous contrast administration. CONTRAST:  34m ISOVUE-300 IOPAMIDOL (ISOVUE-300) INJECTION 61% COMPARISON:  03/15/2015 FINDINGS: Mediastinum/Lymph Nodes: Normal heart size. Aortic atherosclerosis is identified. Coronary artery calcification identified scratch set LAD coronary artery calcification noted. No mediastinal or hilar adenopathy identified. The trachea appears patent and is midline. Normal appearance of the esophagus. Lungs/Pleura: Moderate changes of centrilobular and paraseptal emphysema. Scarring and architectural distortion within the right upper lobe is again identified. Allowing for differences in technique the overall appearance of the lungs appear unchanged. There is a index solid nodule within the right upper lobe which measures 1.0 x 0.9 cm (mean diameter 1 cm), image number 45 of series 5. This is unchanged from the previous exam. In the right lower lobe there is a focal area of ground-glass attenuation which measures 2.9 x 2.4 cm, image 89 of series 5. Mean diameter is 2.7 cm. On the previous exam this measured 2.7 x 2.0 cm and  had a mean diameter of 2.4 cm. In the left upper lobe there is a faint sub solid nodule measuring 1.5 x 1.5 cm (mean diameter 1.5 cm), image 51 of series 5. This is unchanged from previous exam. No new pulmonary nodules identified. Upper abdomen: The visualized portions of the liver are normal. The gallbladder appears unremarkable. No biliary dilatation. Normal appearance of the pancreas. The spleen is unremarkable. The adrenal glands are both normal. Musculoskeletal: No aggressive lytic or sclerotic bone lesions are identified. IMPRESSION: 1. Stable post radiation change in the right upper lobe. 2. Allowing for differences in techniques (today's exam acquired at 2 mm versus 5 mm previously) there has been no significant interval change in the appearance of solid and sub solid nodules found within both lungs. Continued  interval follow-up is recommended to ensure stability in this patient who is at increased risk. 3. Diffuse bronchial wall thickening with emphysema, as above; imaging findings suggestive of underlying COPD. 4. Aortic atherosclerosis and coronary artery calcifications. Electronically Signed   By: Kerby Moors M.D.   On: 09/18/2015 16:31   ASSESSMENT AND PLAN: This is a very pleasant 80 years old white male recently diagnosed with unresectable stage IIB non-small cell lung cancer, adenocarcinoma, with EGFR amplification, with 2 separate lesions in the right upper lobe and questionable lesion in the left upper lobe. He is status post stereotactic radiotherapy to the right lung nodule. The recent CT scan of the chest showed no evidence for disease progression except for small nodules with no significant change.  I discussed the scan results with the patient and his wife. I recommended for him to continue on observation with repeat CT scan of the chest without contrast in 6 months. He was advised to call immediately if he has any concerning symptoms in the interval. The patient voices understanding of current disease status and treatment options and is in agreement with the current care plan.  All questions were answered. The patient knows to call the clinic with any problems, questions or concerns. We can certainly see the patient much sooner if necessary.  Disclaimer: This note was dictated with voice recognition software. Similar sounding words can inadvertently be transcribed and may not be corrected upon review.

## 2015-10-22 ENCOUNTER — Ambulatory Visit
Admission: RE | Admit: 2015-10-22 | Discharge: 2015-10-22 | Disposition: A | Discharge status: Home / Self Care | Payer: Medicare Other | Source: Ambulatory Visit | Attending: Radiation Oncology | Admitting: Radiation Oncology

## 2015-11-12 DIAGNOSIS — Z1389 Encounter for screening for other disorder: Secondary | ICD-10-CM | POA: Diagnosis not present

## 2015-11-12 DIAGNOSIS — E039 Hypothyroidism, unspecified: Secondary | ICD-10-CM | POA: Diagnosis not present

## 2015-11-12 DIAGNOSIS — Z23 Encounter for immunization: Secondary | ICD-10-CM | POA: Diagnosis not present

## 2015-11-12 DIAGNOSIS — I10 Essential (primary) hypertension: Secondary | ICD-10-CM | POA: Diagnosis not present

## 2015-11-12 DIAGNOSIS — Z6823 Body mass index (BMI) 23.0-23.9, adult: Secondary | ICD-10-CM | POA: Diagnosis not present

## 2015-11-12 DIAGNOSIS — I4891 Unspecified atrial fibrillation: Secondary | ICD-10-CM | POA: Diagnosis not present

## 2015-11-12 DIAGNOSIS — E782 Mixed hyperlipidemia: Secondary | ICD-10-CM | POA: Diagnosis not present

## 2016-02-04 ENCOUNTER — Ambulatory Visit: Payer: Medicare Other | Admitting: Cardiovascular Disease

## 2016-03-05 ENCOUNTER — Encounter: Payer: Self-pay | Admitting: Cardiovascular Disease

## 2016-03-05 ENCOUNTER — Ambulatory Visit (INDEPENDENT_AMBULATORY_CARE_PROVIDER_SITE_OTHER): Payer: Medicare Other | Admitting: Cardiovascular Disease

## 2016-03-05 VITALS — BP 130/80 | HR 81 | Ht 73.0 in | Wt 159.0 lb

## 2016-03-05 DIAGNOSIS — E7800 Pure hypercholesterolemia, unspecified: Secondary | ICD-10-CM

## 2016-03-05 DIAGNOSIS — M25473 Effusion, unspecified ankle: Secondary | ICD-10-CM

## 2016-03-05 DIAGNOSIS — I10 Essential (primary) hypertension: Secondary | ICD-10-CM

## 2016-03-05 DIAGNOSIS — I480 Paroxysmal atrial fibrillation: Secondary | ICD-10-CM

## 2016-03-05 NOTE — Patient Instructions (Signed)
Medication Instructions:   Stop Digoxin.    Continue all other medications.    Labwork: none  Testing/Procedures: none  Follow-Up: Your physician wants you to follow up in:  1 year.  You will receive a reminder letter in the mail one-two months in advance.  If you don't receive a letter, please call our office to schedule the follow up appointment   Any Other Special Instructions Will Be Listed Below (If Applicable).  If you need a refill on your cardiac medications before your next appointment, please call your pharmacy.

## 2016-03-05 NOTE — Progress Notes (Signed)
SUBJECTIVE: The patient is a 81 year old male with a history of paroxysmal atrial fibrillation, lung cancer, hypertension, and hyperlipidemia who returns for past due follow-up.   Echocardiogram on 07/27/14 demonstrated normal left ventricular systolic function and regional wall motion, LVEF 60-65% , mild right atrial and right ventricular dilatation, indeterminate diastolic function , with aortic valve sclerosis without stenosis.  He denies chest pain and palpitations. He continues to have bilateral leg and ankle edema, left worse than right. He takes Lasix prescribed by his PCP but does not know the dose.  ECG performed today shows sinus rhythm with PACs.  Review of Systems: As per "subjective", otherwise negative.  Allergies  Allergen Reactions  . Celebrex [Celecoxib] Other (See Comments)    Bothers the patients eyes.  . Tetracyclines & Related Other (See Comments)    Breaks out in mouth.    Current Outpatient Prescriptions  Medication Sig Dispense Refill  . alprazolam (XANAX) 2 MG tablet Take 2 mg by mouth 3 (three) times daily.    Marland Kitchen aspirin EC 81 MG tablet Take 81 mg by mouth daily.    . digoxin (LANOXIN) 0.125 MG tablet Take 1 tablet by mouth daily.    Marland Kitchen docusate sodium (COLACE) 100 MG capsule Take 100-200 mg by mouth daily as needed for mild constipation or moderate constipation.     . Furosemide (LASIX PO) Take by mouth. Daily doesn't know dose    . levothyroxine (SYNTHROID, LEVOTHROID) 50 MCG tablet Take 50 mcg by mouth daily before breakfast.    . metoprolol tartrate (LOPRESSOR) 25 MG tablet TAKE (1) TABLET BY MOUTH TWICE DAILY. 30 tablet 0  . Multiple Vitamins-Minerals (MULTIVITAMIN WITH MINERALS) tablet Take 1 tablet by mouth daily.    . Omega-3 Fatty Acids (FISH OIL) 1000 MG CAPS Take 1 capsule by mouth daily.    Marland Kitchen PROAIR HFA 108 (90 BASE) MCG/ACT inhaler Inhale 1 puff into the lungs every 4 (four) hours as needed for wheezing or shortness of breath.     . Saw  Palmetto, Serenoa repens, (SAW PALMETTO BERRIES PO) Take 2 capsules by mouth daily.    . simvastatin (ZOCOR) 20 MG tablet Take 20 mg by mouth daily.     No current facility-administered medications for this visit.    Facility-Administered Medications Ordered in Other Visits  Medication Dose Route Frequency Provider Last Rate Last Dose  . heparin lock flush 100 unit/mL  500 Units Intravenous Once Curt Bears, MD      . sodium chloride 0.9 % injection 10 mL  10 mL Intravenous PRN Curt Bears, MD        Past Medical History:  Diagnosis Date  . Acute respiratory failure with hypoxia ([place]) 07/26/2011  . Anxiety   . Bulla of lung ([place]) 07/28/2011  . Cavitary lesion of lung 07/28/2011   AFB smears x3 negative.  . Colon cancer ([place]) 1993  . COPD (chronic obstructive pulmonary disease) ([place])   . GERD (gastroesophageal reflux disease)    Hx: of  . Headache(784.0)   . High cholesterol   . Hypertension   . Lung cancer ([place])   . Numbness and tingling    Hx: of in right hand and B/LLE  . Pneumonia   . Rapid atrial fibrillation ([place]) 07/28/2011   Paroxysmal, newly diagnosed.  . Tobacco abuse 08/01/2011    Past Surgical History:  Procedure Laterality Date  . APPENDECTOMY    . CERVICAL DISC SURGERY    . COLON SURGERY    .  EYE SURGERY     as a child  . TONSILLECTOMY    . VIDEO BRONCHOSCOPY WITH ENDOBRONCHIAL NAVIGATION N/A 05/18/2013   Procedure: VIDEO BRONCHOSCOPY WITH ENDOBRONCHIAL NAVIGATION;  Surgeon: [name], MD;  Location: [facility];  Service: Thoracic;  Laterality: N/A;    Social History   Social History  . Marital status: Married    Spouse name: N/A  . Number of children: N/A  . Years of education: N/A   Occupational History  . Not on file.   Social History Main Topics  . Smoking status: Former Smoker    Packs/day: 1.00    Years: 50.00    Types: Cigarettes    Quit date: 07/16/2011  . Smokeless tobacco: Never Used     Comment: started using electric  cigarettes in 06-2010  . Alcohol use No  . Drug use: No  . Sexual activity: Not Currently   Other Topics Concern  . Not on file   Social History Narrative  . No narrative on file     Vitals:   03/05/16 1003  BP: 130/80  Pulse: 81  SpO2: 95%  Weight: 159 lb (72.1 kg)  Height: '6\' 1"'$  (1.854 m)    PHYSICAL EXAM General: NAD, using oxygen by nasal cannula. HEENT: Normal. Neck: No JVD, no thyromegaly. Lungs: Diminished sounds w/o rales/wheezes. CV: Distant tones. Regular rate and rhythm, normal S1/S2, no S3/S4, no murmur. 1+ periankle edema, L>R. Abdomen: Soft, nontender, no distention.  Neurologic: Alert and oriented x 3.  Psych: Normal affect. Skin: Normal.    ECG: Most recent ECG reviewed.      ASSESSMENT AND PLAN: 1. Paroxysmal atrial fibrillation: Currently in sinus rhythm. Continue metoprolol 25 mg bid. Already taking digoxin 0.125 mg daily which I will stop. CHADS2VASC score is at least 3 if not 4, thus placing him at high thromboembolic risk. I previously had a long discussion with the patient and his wife about anticoagulation and he deferred.  2. Essential HTN: Controlled. No changes.  3. Hyperlipidemia: Continue simvastatin 20 mg.  4. Bilateral ankle edema: No pain. On Lasix. Does not know the dose.   Dispo: f/u 1 yr   [name], M.D., F.A.C.C.

## 2016-03-24 ENCOUNTER — Other Ambulatory Visit (HOSPITAL_BASED_OUTPATIENT_CLINIC_OR_DEPARTMENT_OTHER): Payer: Medicare Other

## 2016-03-24 ENCOUNTER — Encounter (HOSPITAL_COMMUNITY): Payer: Self-pay

## 2016-03-24 ENCOUNTER — Ambulatory Visit (HOSPITAL_COMMUNITY)
Admission: RE | Admit: 2016-03-24 | Discharge: 2016-03-24 | Disposition: A | Discharge status: Home / Self Care | Payer: Medicare Other | Source: Ambulatory Visit | Attending: Internal Medicine | Admitting: Internal Medicine

## 2016-03-24 DIAGNOSIS — C3411 Malignant neoplasm of upper lobe, right bronchus or lung: Secondary | ICD-10-CM | POA: Diagnosis not present

## 2016-03-24 DIAGNOSIS — R918 Other nonspecific abnormal finding of lung field: Secondary | ICD-10-CM | POA: Diagnosis not present

## 2016-03-24 LAB — CBC WITH DIFFERENTIAL/PLATELET
BASO%: 0.5 % (ref 0.0–2.0)
Basophils Absolute: 0 10*3/uL (ref 0.0–0.1)
EOS%: 3.3 % (ref 0.0–7.0)
Eosinophils Absolute: 0.2 10*3/uL (ref 0.0–0.5)
HCT: 43.9 % (ref 38.4–49.9)
HGB: 14.1 g/dL (ref 13.0–17.1)
LYMPH%: 21.7 % (ref 14.0–49.0)
MCH: 28.9 pg (ref 27.2–33.4)
MCHC: 32.1 g/dL (ref 32.0–36.0)
MCV: 90 fL (ref 79.3–98.0)
MONO#: 0.6 10*3/uL (ref 0.1–0.9)
MONO%: 8.7 % (ref 0.0–14.0)
NEUT%: 65.8 % (ref 39.0–75.0)
NEUTROS ABS: 4.3 10*3/uL (ref 1.5–6.5)
Platelets: 177 10*3/uL (ref 140–400)
RBC: 4.88 10*6/uL (ref 4.20–5.82)
RDW: 13.8 % (ref 11.0–14.6)
WBC: 6.6 10*3/uL (ref 4.0–10.3)
lymph#: 1.4 10*3/uL (ref 0.9–3.3)

## 2016-03-24 LAB — COMPREHENSIVE METABOLIC PANEL
ALT: 13 U/L (ref 0–55)
AST: 22 U/L (ref 5–34)
Albumin: 3.7 g/dL (ref 3.5–5.0)
Alkaline Phosphatase: 110 U/L (ref 40–150)
Anion Gap: 9 mEq/L (ref 3–11)
BILIRUBIN TOTAL: 0.63 mg/dL (ref 0.20–1.20)
BUN: 12.9 mg/dL (ref 7.0–26.0)
CHLORIDE: 95 meq/L — AB (ref 98–109)
CO2: 32 meq/L — AB (ref 22–29)
CREATININE: 1.1 mg/dL (ref 0.7–1.3)
Calcium: 9.6 mg/dL (ref 8.4–10.4)
EGFR: 68 mL/min/{1.73_m2} — ABNORMAL LOW (ref >90)
GLUCOSE: 105 mg/dL (ref 70–140)
Potassium: 4.3 mEq/L (ref 3.5–5.1)
Sodium: 136 mEq/L (ref 136–145)
TOTAL PROTEIN: 7.3 g/dL (ref 6.4–8.3)

## 2016-03-24 MED ORDER — IOPAMIDOL (ISOVUE-300) INJECTION 61%
75.0000 mL | Freq: Once | INTRAVENOUS | Status: AC | PRN
  Administered 2016-03-24: 75 mL via INTRAVENOUS

## 2016-03-24 MED ORDER — IOPAMIDOL (ISOVUE-300) INJECTION 61%
INTRAVENOUS | Status: AC
  Filled 2016-03-24: qty 75

## 2016-04-01 ENCOUNTER — Ambulatory Visit (HOSPITAL_BASED_OUTPATIENT_CLINIC_OR_DEPARTMENT_OTHER): Payer: Medicare Other | Admitting: Internal Medicine

## 2016-04-01 ENCOUNTER — Encounter: Payer: Self-pay | Admitting: Internal Medicine

## 2016-04-01 ENCOUNTER — Telehealth: Payer: Self-pay | Admitting: Internal Medicine

## 2016-04-01 VITALS — BP 130/81 | HR 78 | Temp 98.0°F | Resp 18 | Wt 163.4 lb

## 2016-04-01 DIAGNOSIS — C3411 Malignant neoplasm of upper lobe, right bronchus or lung: Secondary | ICD-10-CM

## 2016-04-01 NOTE — Telephone Encounter (Signed)
Gave patient avs report and appointments for July and August. [facility] radiology will call re scan.  °

## 2016-04-01 NOTE — Progress Notes (Signed)
[facility] Telephone:([phone]   Fax:(336) [place], MD [address] [place] [address]  DIAGNOSIS: Unresectable he stage IIB (T3, N0, M0) non-small cell lung cancer, adenocarcinoma with positive EGFR amplification (G598V, G719A) diagnosed in March of 2015.  PRIOR THERAPY: Stereotactic radiotherapy to the right upper lobe pulmonary nodules under the care of Dr. Tammi Klippel completed on 07/26/2013.  CURRENT THERAPY: Observation.  INTERVAL HISTORY: Adam Ward 81 y.o. male came to the clinic today for follow-up visit accompanied by his wife. The patient has no complaints today. He is feeling fine with no significant chest pain, shortness breath, cough or hemoptysis. He has no fever or chills. He has no nausea or vomiting. He denied having any significant weight loss or night sweats. He is currently on Lasix for swelling of the lower extremity and congestive heart failure. He had repeat CT scan of the chest performed recently and he is here for evaluation and discussion of his scan results.  MEDICAL HISTORY: Past Medical History:  Diagnosis Date  . Acute respiratory failure with hypoxia ([place]) 07/26/2011  . Anxiety   . Bulla of lung ([facility] [place]) 07/28/2011  . Cavitary lesion of lung 07/28/2011   AFB smears x3 negative.  . Colon cancer ([place]) 1993  . COPD (chronic obstructive pulmonary disease) ([place])   . GERD (gastroesophageal reflux disease)    Hx: of  . Headache(784.0)   . High cholesterol   . Hypertension   . Lung cancer ([place])   . Numbness and tingling    Hx: of in right hand and B/LLE  . Pneumonia   . Rapid atrial fibrillation ([place]) 07/28/2011   Paroxysmal, newly diagnosed.  . Tobacco abuse 08/01/2011    ALLERGIES:  is allergic to celebrex [celecoxib] and tetracyclines & related.  MEDICATIONS:  Current Outpatient Prescriptions  Medication Sig Dispense Refill  . alprazolam (XANAX) 2 MG tablet Take 2 mg by mouth  3 (three) times daily.    Marland Kitchen aspirin EC 81 MG tablet Take 81 mg by mouth daily.    Marland Kitchen docusate sodium (COLACE) 100 MG capsule Take 100-200 mg by mouth daily as needed for mild constipation or moderate constipation.     . Furosemide (LASIX PO) Take by mouth. Daily doesn't know dose    . levothyroxine (SYNTHROID, LEVOTHROID) 50 MCG tablet Take 50 mcg by mouth daily before breakfast.    . metoprolol tartrate (LOPRESSOR) 25 MG tablet TAKE (1) TABLET BY MOUTH TWICE DAILY. 30 tablet 0  . Multiple Vitamins-Minerals (MULTIVITAMIN WITH MINERALS) tablet Take 1 tablet by mouth daily.    . Omega-3 Fatty Acids (FISH OIL) 1000 MG CAPS Take 1 capsule by mouth daily.    Marland Kitchen PROAIR HFA 108 (90 BASE) MCG/ACT inhaler Inhale 1 puff into the lungs every 4 (four) hours as needed for wheezing or shortness of breath.     . Saw Palmetto, Serenoa repens, (SAW PALMETTO BERRIES PO) Take 2 capsules by mouth daily.    . simvastatin (ZOCOR) 20 MG tablet Take 20 mg by mouth daily.     No current facility-administered medications for this visit.    Facility-Administered Medications Ordered in Other Visits  Medication Dose Route Frequency Provider Last Rate Last Dose  . heparin lock flush 100 unit/mL  500 Units Intravenous Once Curt Bears, MD      . sodium chloride 0.9 % injection 10 mL  10 mL Intravenous PRN Curt Bears, MD  SURGICAL HISTORY:  Past Surgical History:  Procedure Laterality Date  . APPENDECTOMY    . CERVICAL DISC SURGERY    . COLON SURGERY    . EYE SURGERY     as a child  . TONSILLECTOMY    . VIDEO BRONCHOSCOPY WITH ENDOBRONCHIAL NAVIGATION N/A 05/18/2013   Procedure: VIDEO BRONCHOSCOPY WITH ENDOBRONCHIAL NAVIGATION;  Surgeon: [name], MD;  Location[facility];  Service: Thoracic;  Laterality: N/A;    REVIEW OF SYSTEMS:  A comprehensive review of systems was negative except for: Constitutional: positive for fatigue Respiratory: positive for dyspnea on exertion   PHYSICAL EXAMINATION:  General appearance: alert, cooperative and no distress Head: Normocephalic, without obvious abnormality, atraumatic Neck: no adenopathy, no JVD, supple, symmetrical, trachea midline and thyroid not enlarged, symmetric, no tenderness/mass/nodules Lymph nodes: Cervical, supraclavicular, and axillary nodes normal. Resp: clear to auscultation bilaterally Back: symmetric, no curvature. ROM normal. No CVA tenderness. Cardio: regular rate and rhythm, S1, S2 normal, no murmur, click, rub or gallop GI: soft, non-tender; bowel sounds normal; no masses,  no organomegaly Extremities: extremities normal, atraumatic, no cyanosis or edema  ECOG PERFORMANCE STATUS: 1 - Symptomatic but completely ambulatory  Blood pressure 130/81, pulse 78, temperature 98 F (36.7 C), temperature source Oral, resp. rate 18, weight 163 lb 6.4 oz (74.1 kg), SpO2 92 %.  LABORATORY DATA: Lab Results  Component Value Date   WBC 6.6 03/24/2016   HGB 14.1 03/24/2016   HCT 43.9 03/24/2016   MCV 90.0 03/24/2016   PLT 177 03/24/2016      Chemistry      Component Value Date/Time   NA 136 03/24/2016 0946   K 4.3 03/24/2016 0946   CL 104 07/26/2014 0212   CO2 32 (H) 03/24/2016 0946   BUN 12.9 03/24/2016 0946   CREATININE 1.1 03/24/2016 0946      Component Value Date/Time   CALCIUM 9.6 03/24/2016 0946   ALKPHOS 110 03/24/2016 0946   AST 22 03/24/2016 0946   ALT 13 03/24/2016 0946   BILITOT 0.63 03/24/2016 0946       RADIOGRAPHIC STUDIES: Ct Chest W Contrast  Result Date: 03/24/2016 CLINICAL DATA:  Right lung cancer diagnosed 2015, chemotherapy complete, for restaging. Colon cancer diagnosed 1993. EXAM: CT CHEST WITH CONTRAST TECHNIQUE: Multidetector CT imaging of the chest was performed during intravenous contrast administration. CONTRAST:  31m ISOVUE-300 IOPAMIDOL (ISOVUE-300) INJECTION 61% COMPARISON:  09/18/2015. FINDINGS: Cardiovascular: The heart is normal in size. No pericardial effusion. Coronary  atherosclerosis in the LAD. Atherosclerotic calcifications of the aortic arch. Mitral valve annular calcifications. Mediastinum/Nodes: Small mediastinal lymph nodes measuring up to 5 mm short axis, within normal limits. Visualized thyroid is unremarkable. Lungs/Pleura: Radiation changes in the lateral right upper lobe. Possible underlying 9 mm nodule (series 6/image 49), grossly unchanged. 1.5 cm faint ground-glass nodule in the medial left upper lobe (series 6/image 56), unchanged. 2.8 cm ground-glass nodule in the posterior right lower lobe (series 6/image 88), unchanged. Biapical pleural-parenchymal scarring. Underlying mild to moderate centrilobular emphysematous changes, upper lobe predominant. No focal consolidation. No pleural effusion or pneumothorax. Upper Abdomen: Visualized upper abdomen is notable for a small hiatal hernia and vascular calcifications. Musculoskeletal: Visualized osseous structures are within normal limits. Cervical spine fixation hardware. IMPRESSION: Radiation changes in the right upper lobe. Possible underlying 9 mm nodule, grossly unchanged. No findings suspicious for metastatic disease. Stable bilateral ground-glass nodular opacities, as above, unchanged. Consider attention on follow-up as clinically warranted. At a minimum, annual surveillance is suggested for the  ground-glass opacities. Electronically Signed   By: Julian Hy M.D.   On: 03/24/2016 14:20   ASSESSMENT AND PLAN:  This is a very pleasant 81 years old white male with unresectable a stage IIb non-small cell lung cancer, adenocarcinoma with EGFR amplification and 2 separate lesions in the right upper lobe status post stereotactic radiotherapy. The patient is currently on observation. His recent CT scan of the chest showed no clear evidence for disease recurrence. I discussed the scan results with the patient and his wife. I recommended for him to continue on observation with repeat CT scan of the chest  without contrast in 6 months. He was advised to call immediately if he has any concerning symptoms in the interval. The patient voices understanding of current disease status and treatment options and is in agreement with the current care plan.  All questions were answered. The patient knows to call the clinic with any problems, questions or concerns. We can certainly see the patient much sooner if necessary. I spent 10 minutes counseling the patient face to face. The total time spent in the appointment was 15 minutes.  Disclaimer: This note was dictated with voice recognition software. Similar sounding words can inadvertently be transcribed and may not be corrected upon review.

## 2016-04-18 ENCOUNTER — Ambulatory Visit (HOSPITAL_COMMUNITY)
Admission: RE | Admit: 2016-04-18 | Discharge: 2016-04-18 | Disposition: A | Discharge status: Home / Self Care | Payer: Medicare Other | Source: Ambulatory Visit | Attending: Family Medicine | Admitting: Family Medicine

## 2016-04-18 ENCOUNTER — Other Ambulatory Visit (HOSPITAL_COMMUNITY): Payer: Self-pay | Admitting: Family Medicine

## 2016-04-18 DIAGNOSIS — Z1389 Encounter for screening for other disorder: Secondary | ICD-10-CM | POA: Diagnosis not present

## 2016-04-18 DIAGNOSIS — C3490 Malignant neoplasm of unspecified part of unspecified bronchus or lung: Secondary | ICD-10-CM | POA: Diagnosis not present

## 2016-04-18 DIAGNOSIS — M7989 Other specified soft tissue disorders: Secondary | ICD-10-CM | POA: Diagnosis not present

## 2016-04-18 DIAGNOSIS — E039 Hypothyroidism, unspecified: Secondary | ICD-10-CM | POA: Diagnosis not present

## 2016-04-18 DIAGNOSIS — I480 Paroxysmal atrial fibrillation: Secondary | ICD-10-CM | POA: Diagnosis not present

## 2016-04-18 DIAGNOSIS — R600 Localized edema: Secondary | ICD-10-CM | POA: Diagnosis not present

## 2016-04-18 DIAGNOSIS — Z6822 Body mass index (BMI) 22.0-22.9, adult: Secondary | ICD-10-CM | POA: Diagnosis not present

## 2016-09-29 ENCOUNTER — Other Ambulatory Visit: Payer: Medicare Other

## 2016-09-30 ENCOUNTER — Other Ambulatory Visit (HOSPITAL_BASED_OUTPATIENT_CLINIC_OR_DEPARTMENT_OTHER): Payer: Medicare Other

## 2016-09-30 ENCOUNTER — Encounter (HOSPITAL_COMMUNITY): Payer: Self-pay

## 2016-09-30 ENCOUNTER — Ambulatory Visit (HOSPITAL_COMMUNITY)
Admission: RE | Admit: 2016-09-30 | Discharge: 2016-09-30 | Disposition: A | Discharge status: Home / Self Care | Payer: Medicare Other | Source: Ambulatory Visit | Attending: Internal Medicine | Admitting: Internal Medicine

## 2016-09-30 DIAGNOSIS — J439 Emphysema, unspecified: Secondary | ICD-10-CM | POA: Insufficient documentation

## 2016-09-30 DIAGNOSIS — R918 Other nonspecific abnormal finding of lung field: Secondary | ICD-10-CM | POA: Diagnosis not present

## 2016-09-30 DIAGNOSIS — C3411 Malignant neoplasm of upper lobe, right bronchus or lung: Secondary | ICD-10-CM

## 2016-09-30 DIAGNOSIS — I700 Atherosclerosis of aorta: Secondary | ICD-10-CM | POA: Insufficient documentation

## 2016-09-30 LAB — CBC WITH DIFFERENTIAL/PLATELET
BASO%: 0.3 % (ref 0.0–2.0)
Basophils Absolute: 0 10*3/uL (ref 0.0–0.1)
EOS ABS: 0.2 10*3/uL (ref 0.0–0.5)
EOS%: 2.4 % (ref 0.0–7.0)
HCT: 43.6 % (ref 38.4–49.9)
HEMOGLOBIN: 13.7 g/dL (ref 13.0–17.1)
LYMPH#: 1.6 10*3/uL (ref 0.9–3.3)
LYMPH%: 22 % (ref 14.0–49.0)
MCH: 29.2 pg (ref 27.2–33.4)
MCHC: 31.4 g/dL — ABNORMAL LOW (ref 32.0–36.0)
MCV: 93 fL (ref 79.3–98.0)
MONO#: 0.5 10*3/uL (ref 0.1–0.9)
MONO%: 6.7 % (ref 0.0–14.0)
NEUT%: 68.6 % (ref 39.0–75.0)
NEUTROS ABS: 4.9 10*3/uL (ref 1.5–6.5)
Platelets: 155 10*3/uL (ref 140–400)
RBC: 4.69 10*6/uL (ref 4.20–5.82)
RDW: 13 % (ref 11.0–14.6)
WBC: 7.2 10*3/uL (ref 4.0–10.3)

## 2016-09-30 LAB — COMPREHENSIVE METABOLIC PANEL
ALT: 7 U/L (ref 0–55)
ANION GAP: 9 meq/L (ref 3–11)
AST: 21 U/L (ref 5–34)
Albumin: 3.6 g/dL (ref 3.5–5.0)
Alkaline Phosphatase: 112 U/L (ref 40–150)
BILIRUBIN TOTAL: 0.44 mg/dL (ref 0.20–1.20)
BUN: 21.7 mg/dL (ref 7.0–26.0)
CALCIUM: 9.1 mg/dL (ref 8.4–10.4)
CO2: 35 meq/L — AB (ref 22–29)
CREATININE: 1.1 mg/dL (ref 0.7–1.3)
Chloride: 99 mEq/L (ref 98–109)
EGFR: 66 mL/min/{1.73_m2} — ABNORMAL LOW (ref >90)
Glucose: 98 mg/dl (ref 70–140)
Potassium: 4.2 mEq/L (ref 3.5–5.1)
Sodium: 142 mEq/L (ref 136–145)
TOTAL PROTEIN: 7.1 g/dL (ref 6.4–8.3)

## 2016-10-01 DIAGNOSIS — F419 Anxiety disorder, unspecified: Secondary | ICD-10-CM | POA: Diagnosis not present

## 2016-10-01 DIAGNOSIS — Z0000 Encounter for general adult medical examination without abnormal findings: Secondary | ICD-10-CM | POA: Diagnosis not present

## 2016-10-01 DIAGNOSIS — Z6823 Body mass index (BMI) 23.0-23.9, adult: Secondary | ICD-10-CM | POA: Diagnosis not present

## 2016-10-03 ENCOUNTER — Telehealth: Payer: Self-pay | Admitting: Internal Medicine

## 2016-10-03 NOTE — Telephone Encounter (Signed)
Call day - spoke with wife re new time for 8/6 f/u.

## 2016-10-06 ENCOUNTER — Ambulatory Visit (HOSPITAL_BASED_OUTPATIENT_CLINIC_OR_DEPARTMENT_OTHER): Payer: Medicare Other | Admitting: Internal Medicine

## 2016-10-06 ENCOUNTER — Telehealth: Payer: Self-pay | Admitting: Internal Medicine

## 2016-10-06 ENCOUNTER — Encounter: Payer: Self-pay | Admitting: Internal Medicine

## 2016-10-06 VITALS — BP 159/71 | HR 73 | Temp 98.5°F | Resp 16 | Ht 73.0 in | Wt 164.4 lb

## 2016-10-06 DIAGNOSIS — I10 Essential (primary) hypertension: Secondary | ICD-10-CM

## 2016-10-06 DIAGNOSIS — C3411 Malignant neoplasm of upper lobe, right bronchus or lung: Secondary | ICD-10-CM

## 2016-10-06 NOTE — Progress Notes (Signed)
[facility] [phone]   Fax:([phone] OFFICE PROGRESS NOTE  [name], [place] [address]  DIAGNOSIS: Unresectable he stage IIB (T3, N0, M0) non-small cell lung cancer, adenocarcinoma with positive EGFR amplification (G598V, G719A) diagnosed in March of 2015.  PRIOR THERAPY: Stereotactic radiotherapy to the right upper lobe pulmonary nodules under the care of Dr. Tammi Klippel completed on 07/26/2013.  CURRENT THERAPY: Observation.  INTERVAL HISTORY: Adam Ward 81 y.o. male returns to the clinic today for follow-up visit accompanied by his wife. The patient is feeling fine today with no specific complaints except for generalized weakness. He denied having any chest pain, shortness of breath, cough or hemoptysis. He has no fever, or chills. He has no weight loss or night sweats. He had repeat CT scan of the chest performed recently and he is here for evaluation and discussion of his scan results.  MEDICAL HISTORY: Past Medical History:  Diagnosis Date  . Acute respiratory failure with hypoxia ([place]) 07/26/2011  . Anxiety   . Bulla of lung ([facility]) 07/28/2011  . Cavitary lesion of lung 07/28/2011   AFB smears x3 negative.  . Colon cancer ([place]) 1993  . COPD (chronic obstructive pulmonary disease) ([facility])   . GERD (gastroesophageal reflux disease)    Hx: of  . Headache(784.0)   . High cholesterol   . Hypertension   . Lung cancer ([place])   . Numbness and tingling    Hx: of in right hand and B/LLE  . Pneumonia   . Rapid atrial fibrillation ([facility]) 07/28/2011   Paroxysmal, newly diagnosed.  . Tobacco abuse 08/01/2011    ALLERGIES:  is allergic to celebrex [celecoxib] and tetracyclines & related.  MEDICATIONS:  Current Outpatient Prescriptions  Medication Sig Dispense Refill  . alprazolam (XANAX) 2 MG tablet Take 2 mg by mouth 3 (three) times daily.    Marland Kitchen aspirin EC 81 MG tablet Take 81 mg by mouth daily.    . bumetanide  (BUMEX) 1 MG tablet     . docusate sodium (COLACE) 100 MG capsule Take 100-200 mg by mouth daily as needed for mild constipation or moderate constipation.     . Furosemide (LASIX PO) Take by mouth. Daily doesn't know dose    . levothyroxine (SYNTHROID, LEVOTHROID) 50 MCG tablet Take 50 mcg by mouth daily before breakfast.    . metoprolol tartrate (LOPRESSOR) 25 MG tablet TAKE (1) TABLET BY MOUTH TWICE DAILY. 30 tablet 0  . Multiple Vitamins-Minerals (MULTIVITAMIN WITH MINERALS) tablet Take 1 tablet by mouth daily.    . Omega-3 Fatty Acids (FISH OIL) 1000 MG CAPS Take 1 capsule by mouth daily.    Marland Kitchen PROAIR HFA 108 (90 BASE) MCG/ACT inhaler Inhale 1 puff into the lungs every 4 (four) hours as needed for wheezing or shortness of breath.     . Saw Palmetto, Serenoa repens, (SAW PALMETTO BERRIES PO) Take 2 capsules by mouth daily.    . simvastatin (ZOCOR) 20 MG tablet Take 20 mg by mouth daily.     No current facility-administered medications for this visit.     SURGICAL HISTORY:  Past Surgical History:  Procedure Laterality Date  . APPENDECTOMY    . CERVICAL DISC SURGERY    . COLON SURGERY    . EYE SURGERY     as a child  . TONSILLECTOMY    . VIDEO BRONCHOSCOPY WITH ENDOBRONCHIAL NAVIGATION N/A 05/18/2013   Procedure: VIDEO BRONCHOSCOPY WITH ENDOBRONCHIAL NAVIGATION;  Surgeon:  [name], MD;  Location: [facility];  Service: Thoracic;  Laterality: N/A;    REVIEW OF SYSTEMS:  A comprehensive review of systems was negative except for: Constitutional: positive for fatigue Respiratory: positive for dyspnea on exertion   PHYSICAL EXAMINATION: General appearance: alert, cooperative and no distress Head: Normocephalic, without obvious abnormality, atraumatic Neck: no adenopathy, no JVD, supple, symmetrical, trachea midline and thyroid not enlarged, symmetric, no tenderness/mass/nodules Lymph nodes: Cervical, supraclavicular, and axillary nodes normal. Resp: clear to auscultation  bilaterally Back: symmetric, no curvature. ROM normal. No CVA tenderness. Cardio: regular rate and rhythm, S1, S2 normal, no murmur, click, rub or gallop GI: soft, non-tender; bowel sounds normal; no masses,  no organomegaly Extremities: extremities normal, atraumatic, no cyanosis or edema  ECOG PERFORMANCE STATUS: 1 - Symptomatic but completely ambulatory  Blood pressure (!) 159/71, pulse 73, temperature 98.5 F (36.9 C), temperature source Oral, resp. rate 16, height '6\' 1"'  (1.854 m), weight 164 lb 6.4 oz (74.6 kg), SpO2 90 %.  LABORATORY DATA: Lab Results  Component Value Date   WBC 7.2 09/30/2016   HGB 13.7 09/30/2016   HCT 43.6 09/30/2016   MCV 93.0 09/30/2016   PLT 155 09/30/2016      Chemistry      Component Value Date/Time   NA 142 09/30/2016 1244   K 4.2 09/30/2016 1244   CL 104 07/26/2014 0212   CO2 35 (H) 09/30/2016 1244   BUN 21.7 09/30/2016 1244   CREATININE 1.1 09/30/2016 1244      Component Value Date/Time   CALCIUM 9.1 09/30/2016 1244   ALKPHOS 112 09/30/2016 1244   AST 21 09/30/2016 1244   ALT 7 09/30/2016 1244   BILITOT 0.44 09/30/2016 1244       RADIOGRAPHIC STUDIES: Ct Chest Wo Contrast  Result Date: 09/30/2016 CLINICAL DATA:  Right lung cancer, status post radiation in 2015. History of colon cancer. EXAM: CT CHEST WITHOUT CONTRAST TECHNIQUE: Multidetector CT imaging of the chest was performed following the standard protocol without IV contrast. COMPARISON:  03/24/2016 FINDINGS: Cardiovascular: The heart is normal in size. No pericardial effusion. Coronary atherosclerosis the LAD and right coronary artery. No evidence of aortic aneurysm. Atherosclerotic calcifications of the aortic arch. Mediastinum/Nodes: No suspicious mediastinal lymphadenopathy. Visualized thyroid is unremarkable. Lungs/Pleura: Biapical pleural-parenchymal scarring. Radiation changes in the lateral right upper lobe (series 7/ image 41). Underlying nodularity is no longer visualized.  2.9 mm ground-glass opacity in the posterior right lower lobe (series 7/image 80), without solid component, unchanged. 2.3 cm ground-glass opacity in the medial left upper lobe (series 7/image 61), grossly unchanged. No focal consolidation. No pleural effusion or pneumothorax. Upper Abdomen: Visualized upper abdomen is grossly unremarkable. Musculoskeletal: Cervical spine fixation hardware. Thoracolumbar spine is within normal limits. IMPRESSION: Radiation changes in the right upper lobe. Stable ground-glass opacities in the left upper lobe and right lower lobe. Annual surveillance is suggested. Aortic Atherosclerosis (ICD10-I70.0) and Emphysema (ICD10-J43.9). Electronically Signed   By: Julian Hy M.D.   On: 09/30/2016 19:30   ASSESSMENT AND PLAN:  This is a very pleasant 81 years old white male with unresectable a stage IIb non-small cell lung cancer, adenocarcinoma with EGFR amplification and 2 separate lesions in the right upper lobe status post stereotactic radiotherapy. The patient continues to do well and he has no concerning complaints today except for the generalized fatigue. He had CT scan of the chest performed recently. His scan showed no evidence for disease recurrence. I discussed the scan results with the patient  and his wife today and recommended for him to continue on observation with repeat CT scan of the chest in 6 months. For hypertension, advise him to monitor his blood pressure closed at home and to report to his primary care physician if no improvement. He was advised to call immediately if he has any concerning symptoms in the interval. The patient voices understanding of current disease status and treatment options and is in agreement with the current care plan. All questions were answered. The patient knows to call the clinic with any problems, questions or concerns. We can certainly see the patient much sooner if necessary. I spent 10 minutes counseling the patient face  to face. The total time spent in the appointment was 15 minutes.  Disclaimer: This note was dictated with voice recognition software. Similar sounding words can inadvertently be transcribed and may not be corrected upon review.

## 2016-10-06 NOTE — Telephone Encounter (Signed)
Gave patient and avs and calendar for upcoming appointments.

## 2016-10-20 DIAGNOSIS — I10 Essential (primary) hypertension: Secondary | ICD-10-CM | POA: Diagnosis not present

## 2016-10-20 DIAGNOSIS — Z6823 Body mass index (BMI) 23.0-23.9, adult: Secondary | ICD-10-CM | POA: Diagnosis not present

## 2016-10-20 DIAGNOSIS — R7309 Other abnormal glucose: Secondary | ICD-10-CM | POA: Diagnosis not present

## 2016-10-20 DIAGNOSIS — C188 Malignant neoplasm of overlapping sites of colon: Secondary | ICD-10-CM | POA: Diagnosis not present

## 2016-10-20 DIAGNOSIS — E039 Hypothyroidism, unspecified: Secondary | ICD-10-CM | POA: Diagnosis not present

## 2016-10-20 DIAGNOSIS — E785 Hyperlipidemia, unspecified: Secondary | ICD-10-CM | POA: Diagnosis not present

## 2016-10-20 DIAGNOSIS — E782 Mixed hyperlipidemia: Secondary | ICD-10-CM | POA: Diagnosis not present

## 2016-10-20 DIAGNOSIS — C3490 Malignant neoplasm of unspecified part of unspecified bronchus or lung: Secondary | ICD-10-CM | POA: Diagnosis not present

## 2016-10-20 DIAGNOSIS — F419 Anxiety disorder, unspecified: Secondary | ICD-10-CM | POA: Diagnosis not present

## 2016-10-20 DIAGNOSIS — I4891 Unspecified atrial fibrillation: Secondary | ICD-10-CM | POA: Diagnosis not present

## 2016-12-18 DIAGNOSIS — Z23 Encounter for immunization: Secondary | ICD-10-CM | POA: Diagnosis not present

## 2017-03-11 ENCOUNTER — Ambulatory Visit: Payer: Medicare Other | Admitting: Cardiovascular Disease

## 2017-04-03 ENCOUNTER — Ambulatory Visit (INDEPENDENT_AMBULATORY_CARE_PROVIDER_SITE_OTHER): Payer: Medicare Other | Admitting: Cardiovascular Disease

## 2017-04-03 ENCOUNTER — Encounter: Payer: Self-pay | Admitting: Cardiovascular Disease

## 2017-04-03 VITALS — BP 110/70 | HR 67 | Ht 73.0 in | Wt 171.0 lb

## 2017-04-03 DIAGNOSIS — R600 Localized edema: Secondary | ICD-10-CM

## 2017-04-03 DIAGNOSIS — E7800 Pure hypercholesterolemia, unspecified: Secondary | ICD-10-CM | POA: Diagnosis not present

## 2017-04-03 DIAGNOSIS — I480 Paroxysmal atrial fibrillation: Secondary | ICD-10-CM | POA: Diagnosis not present

## 2017-04-03 DIAGNOSIS — I10 Essential (primary) hypertension: Secondary | ICD-10-CM

## 2017-04-03 NOTE — Progress Notes (Signed)
SUBJECTIVE: The patient presents for annual follow-up.  He has a history of paroxysmal atrial fibrillation, lung cancer, hypertension, and hyperlipidemia. He was hospitalized in May 2016 for acute hypoxic respiratory failure secondary to community-acquired pneumonia. He had some very mild demand ischemia (troponins peaked at 0.16) at that time as well as atrial fibrillation.  Echocardiogram on 07/27/14 demonstrated normal left ventricular systolic function and regional wall motion, LVEF 60-65% , mild right atrial and right ventricular dilatation, indeterminate diastolic function , with aortic valve sclerosis without stenosis.  He is here with his wife of 50 years.  He has chronic left lower extremity edema.  I reviewed venous Dopplers dated 04/18/16 which demonstrated no evidence of DVT.  He denies chest pain and palpitations.  He seldom has shortness of breath when climbing stairs.  He denies lightheadedness, dizziness, and syncope.  He tells me that Dr. Hilma Favors stopped metoprolol.  They are not certain why.  He quit smoking about 9 years ago.  He said he read a [employer] article several decades ago that recommended taking an aspirin daily.  From that point on he took 325 mg aspirin daily and only recently reduced to 81 mg in the past few years.  ECG performed today which I personally reviewed demonstrated sinus rhythm with a block.   Review of Systems: As per "subjective", otherwise negative.  Allergies  Allergen Reactions  . Celebrex [Celecoxib] Other (See Comments)    Bothers the patients eyes.  . Tetracyclines & Related Other (See Comments)    Breaks out in mouth.    Current Outpatient Medications  Medication Sig Dispense Refill  . alprazolam (XANAX) 2 MG tablet Take 2 mg by mouth 3 (three) times daily.    Marland Kitchen aspirin EC 81 MG tablet Take 81 mg by mouth daily.    . bumetanide (BUMEX) 1 MG tablet Take 1 mg by mouth daily.     Marland Kitchen docusate sodium (COLACE) 100 MG capsule  Take 100-200 mg by mouth daily as needed for mild constipation or moderate constipation.     Marland Kitchen levothyroxine (SYNTHROID, LEVOTHROID) 50 MCG tablet Take 50 mcg by mouth daily before breakfast.    . Saw Palmetto, Serenoa repens, (SAW PALMETTO BERRIES PO) Take 2 capsules by mouth daily.    . simvastatin (ZOCOR) 20 MG tablet Take 20 mg by mouth daily.     No current facility-administered medications for this visit.     Past Medical History:  Diagnosis Date  . Acute respiratory failure with hypoxia ([facility]) 07/26/2011  . Anxiety   . Bulla of lung ([facility]) 07/28/2011  . Cavitary lesion of lung 07/28/2011   AFB smears x3 negative.  . Colon cancer ([place]) 1993  . COPD (chronic obstructive pulmonary disease) ([facility])   . GERD (gastroesophageal reflux disease)    Hx: of  . Headache(784.0)   . High cholesterol   . Hypertension   . Lung cancer ([facility])   . Numbness and tingling    Hx: of in right hand and B/LLE  . Pneumonia   . Rapid atrial fibrillation ([facility]) 07/28/2011   Paroxysmal, newly diagnosed.  . Tobacco abuse 08/01/2011    Past Surgical History:  Procedure Laterality Date  . APPENDECTOMY    . CERVICAL DISC SURGERY    . COLON SURGERY    . EYE SURGERY     as a child  . TONSILLECTOMY    . VIDEO BRONCHOSCOPY WITH ENDOBRONCHIAL NAVIGATION N/A 05/18/2013   Procedure: VIDEO BRONCHOSCOPY WITH ENDOBRONCHIAL  NAVIGATION;  Surgeon: [name], MD;  Location: [facility] OR;  Service: Thoracic;  Laterality: N/A;    Social History   Socioeconomic History  . Marital status: Married    Spouse name: Not on file  . Number of children: Not on file  . Years of education: Not on file  . Highest education level: Not on file  Social Needs  . Financial resource strain: Not on file  . Food insecurity - worry: Not on file  . Food insecurity - inability: Not on file  . Transportation needs - medical: Not on file  . Transportation needs - non-medical: Not on file  Occupational History  . Not on file  Tobacco  Use  . Smoking status: Former Smoker    Packs/day: 1.00    Years: 50.00    Pack years: 50.00    Types: Cigarettes    Last attempt to quit: 07/16/2011    Years since quitting: 5.7  . Smokeless tobacco: Never Used  . Tobacco comment: started using electric cigarettes in 06-2010  Substance and Sexual Activity  . Alcohol use: No  . Drug use: No  . Sexual activity: Not Currently  Other Topics Concern  . Not on file  Social History Narrative  . Not on file     Vitals:   04/03/17 1522  BP: 110/70  Pulse: 67  SpO2: 98%  Weight: 171 lb (77.6 kg)  Height: 6\' 1"  (1.854 m)    Wt Readings from Last 3 Encounters:  04/03/17 171 lb (77.6 kg)  10/06/16 164 lb 6.4 oz (74.6 kg)  04/01/16 163 lb 6.4 oz (74.1 kg)     PHYSICAL EXAM General: NAD HEENT: Normal. Neck: No JVD, no thyromegaly. Lungs: Clear to auscultation bilaterally with normal respiratory effort. CV: Regular rate and rhythm, normal S1/S2, no S3/S4, no murmur. No pretibial or periankle edema.  No carotid bruit.   Abdomen: Soft, nontender, no distention.  Neurologic: Alert and oriented.  Psych: Normal affect. Skin: Normal. Musculoskeletal: No gross deformities.    ECG: Most recent ECG reviewed.   Labs: Lab Results  Component Value Date/Time   K 4.2 09/30/2016 12:44 PM   BUN 21.7 09/30/2016 12:44 PM   CREATININE 1.1 09/30/2016 12:44 PM   ALT 7 09/30/2016 12:44 PM   TSH 1.631 07/28/2011 04:14 AM   HGB 13.7 09/30/2016 12:44 PM     Lipids: Lab Results  Component Value Date/Time   LDLCALC 93 07/06/2012 12:20 PM   CHOL 160 07/06/2012 12:20 PM   TRIG 97 07/06/2012 12:20 PM   HDL 48 07/06/2012 12:20 PM       ASSESSMENT AND PLAN: 1.  Paroxysmal atrial fibrillation: Symptomatically stable and without recurrence.  He previously deferred anticoagulation.  He is currently on aspirin 81 mg.  He had been on metoprolol at his last visit but is no longer taking this as his PCP stopped it and he is uncertain why.  2.   Hypertension: Controlled.  No changes.  3.  Hyperlipidemia: Continue simvastatin.  4.  Left lower extremity edema: Currently on Bumex which may have been prescribed by his PCP.  No evidence of DVT by venous Dopplers in February 2018.   Disposition: Follow up 1 year   [name], M.D., F.A.C.C.

## 2017-04-03 NOTE — Patient Instructions (Signed)

## 2017-04-08 ENCOUNTER — Ambulatory Visit (HOSPITAL_COMMUNITY)
Admission: RE | Admit: 2017-04-08 | Discharge: 2017-04-08 | Disposition: A | Discharge status: Home / Self Care | Payer: Medicare Other | Source: Ambulatory Visit | Attending: Internal Medicine | Admitting: Internal Medicine

## 2017-04-08 ENCOUNTER — Encounter (HOSPITAL_COMMUNITY): Payer: Self-pay

## 2017-04-08 ENCOUNTER — Inpatient Hospital Stay: Payer: Medicare Other | Attending: Internal Medicine

## 2017-04-08 DIAGNOSIS — Z923 Personal history of irradiation: Secondary | ICD-10-CM | POA: Diagnosis not present

## 2017-04-08 DIAGNOSIS — Z7982 Long term (current) use of aspirin: Secondary | ICD-10-CM | POA: Insufficient documentation

## 2017-04-08 DIAGNOSIS — C3490 Malignant neoplasm of unspecified part of unspecified bronchus or lung: Secondary | ICD-10-CM | POA: Diagnosis not present

## 2017-04-08 DIAGNOSIS — Z85038 Personal history of other malignant neoplasm of large intestine: Secondary | ICD-10-CM | POA: Insufficient documentation

## 2017-04-08 DIAGNOSIS — Z79899 Other long term (current) drug therapy: Secondary | ICD-10-CM | POA: Diagnosis not present

## 2017-04-08 DIAGNOSIS — E7800 Pure hypercholesterolemia, unspecified: Secondary | ICD-10-CM | POA: Diagnosis not present

## 2017-04-08 DIAGNOSIS — C3411 Malignant neoplasm of upper lobe, right bronchus or lung: Secondary | ICD-10-CM | POA: Diagnosis not present

## 2017-04-08 DIAGNOSIS — Y842 Radiological procedure and radiotherapy as the cause of abnormal reaction of the patient, or of later complication, without mention of misadventure at the time of the procedure: Secondary | ICD-10-CM | POA: Insufficient documentation

## 2017-04-08 DIAGNOSIS — F419 Anxiety disorder, unspecified: Secondary | ICD-10-CM | POA: Diagnosis not present

## 2017-04-08 DIAGNOSIS — I10 Essential (primary) hypertension: Secondary | ICD-10-CM | POA: Insufficient documentation

## 2017-04-08 DIAGNOSIS — I700 Atherosclerosis of aorta: Secondary | ICD-10-CM | POA: Insufficient documentation

## 2017-04-08 DIAGNOSIS — J439 Emphysema, unspecified: Secondary | ICD-10-CM | POA: Insufficient documentation

## 2017-04-08 LAB — CBC WITH DIFFERENTIAL/PLATELET
BASOS ABS: 0 10*3/uL (ref 0.0–0.1)
BASOS PCT: 1 %
EOS ABS: 0.2 10*3/uL (ref 0.0–0.5)
Eosinophils Relative: 3 %
HCT: 42.1 % (ref 38.4–49.9)
Hemoglobin: 13.6 g/dL (ref 13.0–17.1)
Lymphocytes Relative: 21 %
Lymphs Abs: 1.7 10*3/uL (ref 0.9–3.3)
MCH: 28.7 pg (ref 27.2–33.4)
MCHC: 32.3 g/dL (ref 32.0–36.0)
MCV: 89 fL (ref 79.3–98.0)
MONO ABS: 0.6 10*3/uL (ref 0.1–0.9)
MONOS PCT: 7 %
NEUTROS ABS: 5.6 10*3/uL (ref 1.5–6.5)
Neutrophils Relative %: 68 %
Platelets: 175 10*3/uL (ref 140–400)
RBC: 4.73 MIL/uL (ref 4.20–5.82)
RDW: 13.2 % (ref 11.0–14.6)
WBC: 8.2 10*3/uL (ref 4.0–10.3)

## 2017-04-08 LAB — COMPREHENSIVE METABOLIC PANEL
ALBUMIN: 3.3 g/dL — AB (ref 3.5–5.0)
ALK PHOS: 123 U/L (ref 40–150)
ALT: 7 U/L (ref 0–55)
ANION GAP: 7 (ref 3–11)
AST: 17 U/L (ref 5–34)
BILIRUBIN TOTAL: 0.5 mg/dL (ref 0.2–1.2)
BUN: 18 mg/dL (ref 7–26)
CALCIUM: 8.7 mg/dL (ref 8.4–10.4)
CO2: 33 mmol/L — AB (ref 22–29)
CREATININE: 1.11 mg/dL (ref 0.70–1.30)
Chloride: 101 mmol/L (ref 98–109)
GFR calc Af Amer: >60 mL/min (ref >60)
GFR calc non Af Amer: >60 mL/min (ref >60)
GLUCOSE: 135 mg/dL (ref 70–140)
Potassium: 4.1 mmol/L (ref 3.5–5.1)
SODIUM: 141 mmol/L (ref 136–145)
TOTAL PROTEIN: 6.8 g/dL (ref 6.4–8.3)

## 2017-04-13 ENCOUNTER — Inpatient Hospital Stay (HOSPITAL_BASED_OUTPATIENT_CLINIC_OR_DEPARTMENT_OTHER): Payer: Medicare Other | Admitting: Internal Medicine

## 2017-04-13 ENCOUNTER — Encounter: Payer: Self-pay | Admitting: Internal Medicine

## 2017-04-13 ENCOUNTER — Telehealth: Payer: Self-pay | Admitting: Internal Medicine

## 2017-04-13 DIAGNOSIS — Z85038 Personal history of other malignant neoplasm of large intestine: Secondary | ICD-10-CM

## 2017-04-13 DIAGNOSIS — Z923 Personal history of irradiation: Secondary | ICD-10-CM

## 2017-04-13 DIAGNOSIS — C3411 Malignant neoplasm of upper lobe, right bronchus or lung: Secondary | ICD-10-CM

## 2017-04-13 DIAGNOSIS — Z7982 Long term (current) use of aspirin: Secondary | ICD-10-CM | POA: Diagnosis not present

## 2017-04-13 DIAGNOSIS — E7800 Pure hypercholesterolemia, unspecified: Secondary | ICD-10-CM | POA: Diagnosis not present

## 2017-04-13 DIAGNOSIS — C3490 Malignant neoplasm of unspecified part of unspecified bronchus or lung: Secondary | ICD-10-CM

## 2017-04-13 DIAGNOSIS — Z79899 Other long term (current) drug therapy: Secondary | ICD-10-CM | POA: Diagnosis not present

## 2017-04-13 DIAGNOSIS — F419 Anxiety disorder, unspecified: Secondary | ICD-10-CM

## 2017-04-13 NOTE — Progress Notes (Signed)
[facility] [phone]) [phone]   Fax:(336) [phone] OFFICE PROGRESS NOTE  [name], [place] [address]  DIAGNOSIS: Unresectable he stage IIB (T3, N0, M0) non-small cell lung cancer, adenocarcinoma with positive EGFR amplification (G598V, G719A) diagnosed in March of 2015.  PRIOR THERAPY: Stereotactic radiotherapy to the right upper lobe pulmonary nodules under the care of Dr. Tammi Klippel completed on 07/26/2013.  CURRENT THERAPY: Observation.  INTERVAL HISTORY: Adam Ward 82 y.o. male returns to the clinic today for 6 months follow-up visit.  The patient is feeling fine today with no specific complaints.  He denied having any chest pain, shortness of breath, cough or hemoptysis.  He denied having any weight loss or night sweats.  He has no fever or chills.  He denied having any significant nausea, vomiting, diarrhea or constipation.  He has been in observation since his a stereotactic radiotherapy in May 2015.  He had repeat CT scan of the chest performed recently and he is here for evaluation and discussion of his discuss results.  MEDICAL HISTORY: Past Medical History:  Diagnosis Date  . Acute respiratory failure with hypoxia ([facility] [facility]) 07/26/2011  . Anxiety   . Bulla of lung ([place] [facility]) 07/28/2011  . Cavitary lesion of lung 07/28/2011   AFB smears x3 negative.  . Colon cancer ([place]) 1993  . COPD (chronic obstructive pulmonary disease) ([facility])   . GERD (gastroesophageal reflux disease)    Hx: of  . Headache(784.0)   . High cholesterol   . Hypertension   . Lung cancer ([facility])   . Numbness and tingling    Hx: of in right hand and B/LLE  . Pneumonia   . Rapid atrial fibrillation ([facility]) 07/28/2011   Paroxysmal, newly diagnosed.  . Tobacco abuse 08/01/2011    ALLERGIES:  is allergic to celebrex [celecoxib] and tetracyclines & related.  MEDICATIONS:  Current Outpatient Medications  Medication Sig Dispense Refill  . alprazolam (XANAX) 2 MG  tablet Take 2 mg by mouth 3 (three) times daily.    Marland Kitchen aspirin EC 81 MG tablet Take 81 mg by mouth daily.    . bumetanide (BUMEX) 1 MG tablet Take 1 mg by mouth daily.     Marland Kitchen docusate sodium (COLACE) 100 MG capsule Take 100-200 mg by mouth daily as needed for mild constipation or moderate constipation.     Marland Kitchen levothyroxine (SYNTHROID, LEVOTHROID) 50 MCG tablet Take 50 mcg by mouth daily before breakfast.    . Saw Palmetto, Serenoa repens, (SAW PALMETTO BERRIES PO) Take 2 capsules by mouth daily.    . simvastatin (ZOCOR) 20 MG tablet Take 20 mg by mouth daily.     No current facility-administered medications for this visit.     SURGICAL HISTORY:  Past Surgical History:  Procedure Laterality Date  . APPENDECTOMY    . CERVICAL DISC SURGERY    . COLON SURGERY    . EYE SURGERY     as a child  . TONSILLECTOMY    . VIDEO BRONCHOSCOPY WITH ENDOBRONCHIAL NAVIGATION N/A 05/18/2013   Procedure: VIDEO BRONCHOSCOPY WITH ENDOBRONCHIAL NAVIGATION;  Surgeon: [name], MD;  Location: [facility] OR;  Service: Thoracic;  Laterality: N/A;    REVIEW OF SYSTEMS:  A comprehensive review of systems was negative except for: Respiratory: positive for dyspnea on exertion   PHYSICAL EXAMINATION: General appearance: alert, cooperative and no distress Head: Normocephalic, without obvious abnormality, atraumatic Neck: no adenopathy, no JVD, supple, symmetrical, trachea midline and thyroid not  enlarged, symmetric, no tenderness/mass/nodules Lymph nodes: Cervical, supraclavicular, and axillary nodes normal. Resp: clear to auscultation bilaterally Back: symmetric, no curvature. ROM normal. No CVA tenderness. Cardio: regular rate and rhythm, S1, S2 normal, no murmur, click, rub or gallop GI: soft, non-tender; bowel sounds normal; no masses,  no organomegaly Extremities: extremities normal, atraumatic, no cyanosis or edema  ECOG PERFORMANCE STATUS: 1 - Symptomatic but completely ambulatory  Blood pressure (!) 158/78,  pulse (!) 117, temperature 97.8 F (36.6 C), temperature source Oral, resp. rate (!) 22, height '6\' 1"'  (1.854 m), weight 170 lb 4.8 oz (77.2 kg), SpO2 96 %.  LABORATORY DATA: Lab Results  Component Value Date   WBC 8.2 04/08/2017   HGB 13.6 04/08/2017   HCT 42.1 04/08/2017   MCV 89.0 04/08/2017   PLT 175 04/08/2017      Chemistry      Component Value Date/Time   NA 141 04/08/2017 1234   NA 142 09/30/2016 1244   K 4.1 04/08/2017 1234   K 4.2 09/30/2016 1244   CL 101 04/08/2017 1234   CO2 33 (H) 04/08/2017 1234   CO2 35 (H) 09/30/2016 1244   BUN 18 04/08/2017 1234   BUN 21.7 09/30/2016 1244   CREATININE 1.11 04/08/2017 1234   CREATININE 1.1 09/30/2016 1244      Component Value Date/Time   CALCIUM 8.7 04/08/2017 1234   CALCIUM 9.1 09/30/2016 1244   ALKPHOS 123 04/08/2017 1234   ALKPHOS 112 09/30/2016 1244   AST 17 04/08/2017 1234   AST 21 09/30/2016 1244   ALT 7 04/08/2017 1234   ALT 7 09/30/2016 1244   BILITOT 0.5 04/08/2017 1234   BILITOT 0.44 09/30/2016 1244       RADIOGRAPHIC STUDIES: Ct Chest Wo Contrast  Result Date: 04/08/2017 CLINICAL DATA:  Followup lung cancer. EXAM: CT CHEST WITHOUT CONTRAST TECHNIQUE: Multidetector CT imaging of the chest was performed following the standard protocol without IV contrast. COMPARISON:  09/30/2016. FINDINGS: Cardiovascular: The heart size appears within normal limits. No pericardial effusion. Aortic atherosclerosis. Calcification in the LAD coronary artery identified. Mediastinum/Nodes: The trachea appears patent and is midline. Normal appearance of the thyroid gland. Normal appearance of the esophagus. No mediastinal adenopathy. Within the limitations of unenhanced technique no hilar adenopathy noted. No enlarged axillary or supraclavicular lymph nodes. Lungs/Pleura: No pleural effusion identified. Moderate changes of emphysema. Masslike architectural distortion and fibrosis within the right upper lobe is similar to previous exam  compatible with changes of external beam radiation. Ground-glass density within the posterior right lower lobe is unchanged measuring 2.7 cm, image 85 of series 7. Ground-glass density within the medial left upper lobe measures 2 cm and has a small central solid component measuring 3 mm, image 62 of series 7. Unchanged. Nodular scar-like density in the anterior left apex is stable. No new pulmonary nodules identified. Upper Abdomen: Calcified lymph nodes identified within the upper abdomen. No acute abnormality identified. Musculoskeletal: No chest wall mass or suspicious bone lesions identified. IMPRESSION: 1. Stable radiation change in the right upper lobe. 2. No change in ground-glass attenuating nodules within the left upper lobe and right lower lobe. Attention on follow-up imaging advised. 3. Aortic Atherosclerosis (ICD10-I70.0) and Emphysema (ICD10-J43.9). Electronically Signed   By: Kerby Moors M.D.   On: 04/08/2017 16:15   ASSESSMENT AND PLAN:  This is a very pleasant 82 years old white male with unresectable a stage IIb non-small cell lung cancer, adenocarcinoma with EGFR amplification and 2 separate lesions in the right upper  lobe status post stereotactic radiotherapy. The patient has been in observation for more than 3 years and he is doing fine with no specific complaints. The recent CT scan of the chest showed no concerning findings for disease recurrence or progression. I discussed the scan results with the patient and his wife and recommended for him to continue on observation with repeat CT scan of the chest in 6 months. He was advised to call immediately if he has any concerning symptoms in the interval. The patient voices understanding of current disease status and treatment options and is in agreement with the current care plan. All questions were answered. The patient knows to call the clinic with any problems, questions or concerns. We can certainly see the patient much sooner if  necessary. I spent 10 minutes counseling the patient face to face. The total time spent in the appointment was 15 minutes.  Disclaimer: This note was dictated with voice recognition software. Similar sounding words can inadvertently be transcribed and may not be corrected upon review.

## 2017-04-13 NOTE — Telephone Encounter (Signed)
Gave patient AVs and calendar of upcoming august appointments.  °

## 2017-04-23 DIAGNOSIS — Z125 Encounter for screening for malignant neoplasm of prostate: Secondary | ICD-10-CM | POA: Diagnosis not present

## 2017-04-23 DIAGNOSIS — J449 Chronic obstructive pulmonary disease, unspecified: Secondary | ICD-10-CM | POA: Diagnosis not present

## 2017-04-23 DIAGNOSIS — Z6824 Body mass index (BMI) 24.0-24.9, adult: Secondary | ICD-10-CM | POA: Diagnosis not present

## 2017-04-23 DIAGNOSIS — E782 Mixed hyperlipidemia: Secondary | ICD-10-CM | POA: Diagnosis not present

## 2017-04-23 DIAGNOSIS — Z1389 Encounter for screening for other disorder: Secondary | ICD-10-CM | POA: Diagnosis not present

## 2017-04-23 DIAGNOSIS — I10 Essential (primary) hypertension: Secondary | ICD-10-CM | POA: Diagnosis not present

## 2017-04-23 DIAGNOSIS — F419 Anxiety disorder, unspecified: Secondary | ICD-10-CM | POA: Diagnosis not present

## 2017-04-23 DIAGNOSIS — R7309 Other abnormal glucose: Secondary | ICD-10-CM | POA: Diagnosis not present

## 2017-05-27 DIAGNOSIS — Z125 Encounter for screening for malignant neoplasm of prostate: Secondary | ICD-10-CM | POA: Diagnosis not present

## 2017-05-27 DIAGNOSIS — Z1389 Encounter for screening for other disorder: Secondary | ICD-10-CM | POA: Diagnosis not present

## 2017-05-27 DIAGNOSIS — J449 Chronic obstructive pulmonary disease, unspecified: Secondary | ICD-10-CM | POA: Diagnosis not present

## 2017-05-27 DIAGNOSIS — E782 Mixed hyperlipidemia: Secondary | ICD-10-CM | POA: Diagnosis not present

## 2017-09-01 DIAGNOSIS — Z1389 Encounter for screening for other disorder: Secondary | ICD-10-CM | POA: Diagnosis not present

## 2017-09-01 DIAGNOSIS — F419 Anxiety disorder, unspecified: Secondary | ICD-10-CM | POA: Diagnosis not present

## 2017-09-01 DIAGNOSIS — Z6824 Body mass index (BMI) 24.0-24.9, adult: Secondary | ICD-10-CM | POA: Diagnosis not present

## 2017-10-12 ENCOUNTER — Inpatient Hospital Stay: Payer: Medicare Other | Attending: Internal Medicine

## 2017-10-12 ENCOUNTER — Ambulatory Visit (HOSPITAL_COMMUNITY)
Admission: RE | Admit: 2017-10-12 | Discharge: 2017-10-12 | Disposition: A | Discharge status: Home / Self Care | Payer: Medicare Other | Source: Ambulatory Visit | Attending: Internal Medicine | Admitting: Internal Medicine

## 2017-10-12 ENCOUNTER — Encounter (HOSPITAL_COMMUNITY): Payer: Self-pay

## 2017-10-12 DIAGNOSIS — C3490 Malignant neoplasm of unspecified part of unspecified bronchus or lung: Secondary | ICD-10-CM | POA: Diagnosis not present

## 2017-10-12 DIAGNOSIS — J439 Emphysema, unspecified: Secondary | ICD-10-CM | POA: Diagnosis not present

## 2017-10-12 DIAGNOSIS — M7989 Other specified soft tissue disorders: Secondary | ICD-10-CM | POA: Insufficient documentation

## 2017-10-12 DIAGNOSIS — C3411 Malignant neoplasm of upper lobe, right bronchus or lung: Secondary | ICD-10-CM | POA: Insufficient documentation

## 2017-10-12 DIAGNOSIS — J984 Other disorders of lung: Secondary | ICD-10-CM | POA: Diagnosis not present

## 2017-10-12 DIAGNOSIS — Z79899 Other long term (current) drug therapy: Secondary | ICD-10-CM | POA: Insufficient documentation

## 2017-10-12 DIAGNOSIS — Z923 Personal history of irradiation: Secondary | ICD-10-CM | POA: Diagnosis not present

## 2017-10-12 DIAGNOSIS — I700 Atherosclerosis of aorta: Secondary | ICD-10-CM | POA: Insufficient documentation

## 2017-10-12 DIAGNOSIS — Z85038 Personal history of other malignant neoplasm of large intestine: Secondary | ICD-10-CM | POA: Diagnosis not present

## 2017-10-12 DIAGNOSIS — Z7982 Long term (current) use of aspirin: Secondary | ICD-10-CM | POA: Diagnosis not present

## 2017-10-12 LAB — CBC WITH DIFFERENTIAL (CANCER CENTER ONLY)
Basophils Absolute: 0 10*3/uL (ref 0.0–0.1)
Basophils Relative: 0 %
EOS PCT: 5 %
Eosinophils Absolute: 0.3 10*3/uL (ref 0.0–0.5)
HCT: 44.5 % (ref 38.4–49.9)
Hemoglobin: 13.6 g/dL (ref 13.0–17.1)
LYMPHS ABS: 1.7 10*3/uL (ref 0.9–3.3)
LYMPHS PCT: 26 %
MCH: 28.7 pg (ref 27.2–33.4)
MCHC: 30.6 g/dL — ABNORMAL LOW (ref 32.0–36.0)
MCV: 93.9 fL (ref 79.3–98.0)
Monocytes Absolute: 0.7 10*3/uL (ref 0.1–0.9)
Monocytes Relative: 10 %
Neutro Abs: 4.1 10*3/uL (ref 1.5–6.5)
Neutrophils Relative %: 59 %
PLATELETS: 149 10*3/uL (ref 140–400)
RBC: 4.74 MIL/uL (ref 4.20–5.82)
RDW: 13.4 % (ref 11.0–14.6)
WBC Count: 6.8 10*3/uL (ref 4.0–10.3)

## 2017-10-12 LAB — CMP (CANCER CENTER ONLY)
ALBUMIN: 3.7 g/dL (ref 3.5–5.0)
ALT: 7 U/L (ref 0–44)
AST: 16 U/L (ref 15–41)
Alkaline Phosphatase: 107 U/L (ref 38–126)
Anion gap: 8 (ref 5–15)
BILIRUBIN TOTAL: 0.5 mg/dL (ref 0.3–1.2)
BUN: 21 mg/dL (ref 8–23)
CO2: 34 mmol/L — ABNORMAL HIGH (ref 22–32)
Calcium: 8.8 mg/dL — ABNORMAL LOW (ref 8.9–10.3)
Chloride: 101 mmol/L (ref 98–111)
Creatinine: 1.1 mg/dL (ref 0.61–1.24)
GFR, Est AFR Am: >60 mL/min (ref >60)
GFR, Estimated: >60 mL/min (ref >60)
GLUCOSE: 88 mg/dL (ref 70–99)
POTASSIUM: 5 mmol/L (ref 3.5–5.1)
Sodium: 143 mmol/L (ref 135–145)
Total Protein: 7.3 g/dL (ref 6.5–8.1)

## 2017-10-19 ENCOUNTER — Inpatient Hospital Stay (HOSPITAL_BASED_OUTPATIENT_CLINIC_OR_DEPARTMENT_OTHER): Payer: Medicare Other | Admitting: Internal Medicine

## 2017-10-19 ENCOUNTER — Encounter: Payer: Self-pay | Admitting: Internal Medicine

## 2017-10-19 ENCOUNTER — Telehealth: Payer: Self-pay | Admitting: Internal Medicine

## 2017-10-19 DIAGNOSIS — Z85038 Personal history of other malignant neoplasm of large intestine: Secondary | ICD-10-CM | POA: Diagnosis not present

## 2017-10-19 DIAGNOSIS — C3411 Malignant neoplasm of upper lobe, right bronchus or lung: Secondary | ICD-10-CM | POA: Diagnosis not present

## 2017-10-19 DIAGNOSIS — Z7982 Long term (current) use of aspirin: Secondary | ICD-10-CM

## 2017-10-19 DIAGNOSIS — Z79899 Other long term (current) drug therapy: Secondary | ICD-10-CM

## 2017-10-19 DIAGNOSIS — Z923 Personal history of irradiation: Secondary | ICD-10-CM

## 2017-10-19 DIAGNOSIS — M7989 Other specified soft tissue disorders: Secondary | ICD-10-CM

## 2017-10-19 DIAGNOSIS — C3490 Malignant neoplasm of unspecified part of unspecified bronchus or lung: Secondary | ICD-10-CM

## 2017-10-19 NOTE — Telephone Encounter (Signed)
Appts scheduled AVS/Calendar printed per 8/19 los

## 2017-10-19 NOTE — Progress Notes (Signed)
[place] [phone]   [phone] OFFICE PROGRESS NOTE  [name], [place] [address]  DIAGNOSIS: Unresectable he stage IIB (T3, N0, M0) non-small cell lung cancer, adenocarcinoma with positive EGFR amplification (G598V, G719A) diagnosed in March of 2015.  PRIOR THERAPY: Stereotactic radiotherapy to the right upper lobe pulmonary nodules under the care of Dr. Tammi Klippel completed on 07/26/2013.  CURRENT THERAPY: Observation.  INTERVAL HISTORY: Adam Ward 82 y.o. male returns to the clinic today for follow-up visit accompanied by his wife.  The patient is feeling fine today with no specific complaints except for arthralgia as well as intermittent swelling of the lower extremities.  He denied having any significant chest pain but has shortness of breath with exertion with no cough or hemoptysis.  He has no nausea, vomiting, diarrhea or constipation.  He has no fever or chills.  The patient is here today for evaluation with repeat CT scan of the chest for restaging of his disease.   MEDICAL HISTORY: Past Medical History:  Diagnosis Date  . Acute respiratory failure with hypoxia ([place]) 07/26/2011  . Anxiety   . Bulla of lung ([place]) 07/28/2011  . Cavitary lesion of lung 07/28/2011   AFB smears x3 negative.  . Colon cancer ([employer]) 1993  . COPD (chronic obstructive pulmonary disease) ([place])   . GERD (gastroesophageal reflux disease)    Hx: of  . Headache(784.0)   . High cholesterol   . Hypertension   . Lung cancer ([place])   . Numbness and tingling    Hx: of in right hand and B/LLE  . Pneumonia   . Rapid atrial fibrillation ([place]) 07/28/2011   Paroxysmal, newly diagnosed.  . Tobacco abuse 08/01/2011    ALLERGIES:  is allergic to celebrex [celecoxib] and tetracyclines & related.  MEDICATIONS:  Current Outpatient Medications  Medication Sig Dispense Refill  . alprazolam (XANAX) 2 MG tablet Take 2 mg by mouth 3 (three)  times daily.    Marland Kitchen aspirin EC 81 MG tablet Take 81 mg by mouth daily.    Marland Kitchen docusate sodium (COLACE) 100 MG capsule Take 100-200 mg by mouth daily as needed for mild constipation or moderate constipation.     . furosemide (LASIX) 40 MG tablet Take 40 mg by mouth daily.    Marland Kitchen levothyroxine (SYNTHROID, LEVOTHROID) 50 MCG tablet Take 50 mcg by mouth daily before breakfast.    . Saw Palmetto, Serenoa repens, (SAW PALMETTO BERRIES PO) Take 2 capsules by mouth daily.    . simvastatin (ZOCOR) 20 MG tablet Take 20 mg by mouth daily.     No current facility-administered medications for this visit.     SURGICAL HISTORY:  Past Surgical History:  Procedure Laterality Date  . APPENDECTOMY    . CERVICAL DISC SURGERY    . COLON SURGERY    . EYE SURGERY     as a child  . TONSILLECTOMY    . VIDEO BRONCHOSCOPY WITH ENDOBRONCHIAL NAVIGATION N/A 05/18/2013   Procedure: VIDEO BRONCHOSCOPY WITH ENDOBRONCHIAL NAVIGATION;  Surgeon: [name], MD;  Location: [facility] OR;  Service: Thoracic;  Laterality: N/A;    REVIEW OF SYSTEMS:  A comprehensive review of systems was negative except for: Constitutional: positive for fatigue Respiratory: positive for dyspnea on exertion Musculoskeletal: positive for arthralgias   PHYSICAL EXAMINATION: General appearance: alert, cooperative, fatigued and no distress Head: Normocephalic, without obvious abnormality, atraumatic Neck: no adenopathy, no JVD, supple, symmetrical, trachea midline and thyroid  not enlarged, symmetric, no tenderness/mass/nodules Lymph nodes: Cervical, supraclavicular, and axillary nodes normal. Resp: clear to auscultation bilaterally Back: symmetric, no curvature. ROM normal. No CVA tenderness. Cardio: regular rate and rhythm, S1, S2 normal, no murmur, click, rub or gallop GI: soft, non-tender; bowel sounds normal; no masses,  no organomegaly Extremities: extremities normal, atraumatic, no cyanosis or edema  ECOG PERFORMANCE STATUS: 1 - Symptomatic  but completely ambulatory  Blood pressure (!) 119/92, pulse 92, temperature 98.5 F (36.9 C), temperature source Oral, resp. rate 17, height '6\' 1"'  (1.854 m), weight 172 lb 9.6 oz (78.3 kg), SpO2 91 %.  LABORATORY DATA: Lab Results  Component Value Date   WBC 6.8 10/12/2017   HGB 13.6 10/12/2017   HCT 44.5 10/12/2017   MCV 93.9 10/12/2017   PLT 149 10/12/2017      Chemistry      Component Value Date/Time   NA 143 10/12/2017 1435   NA 142 09/30/2016 1244   K 5.0 10/12/2017 1435   K 4.2 09/30/2016 1244   CL 101 10/12/2017 1435   CO2 34 (H) 10/12/2017 1435   CO2 35 (H) 09/30/2016 1244   BUN 21 10/12/2017 1435   BUN 21.7 09/30/2016 1244   CREATININE 1.10 10/12/2017 1435   CREATININE 1.1 09/30/2016 1244      Component Value Date/Time   CALCIUM 8.8 (L) 10/12/2017 1435   CALCIUM 9.1 09/30/2016 1244   ALKPHOS 107 10/12/2017 1435   ALKPHOS 112 09/30/2016 1244   AST 16 10/12/2017 1435   AST 21 09/30/2016 1244   ALT 7 10/12/2017 1435   ALT 7 09/30/2016 1244   BILITOT 0.5 10/12/2017 1435   BILITOT 0.44 09/30/2016 1244       RADIOGRAPHIC STUDIES: Ct Chest Wo Contrast  Result Date: 10/12/2017 CLINICAL DATA:  History of right lung cancer. EXAM: CT CHEST WITHOUT CONTRAST TECHNIQUE: Multidetector CT imaging of the chest was performed following the standard protocol without IV contrast. COMPARISON:  04/08/2017 FINDINGS: Cardiovascular: The heart size is normal. No substantial pericardial effusion. Coronary artery calcification is evident. Atherosclerotic calcification is noted in the wall of the thoracic aorta. Mediastinum/Nodes: No mediastinal lymphadenopathy. No evidence for gross hilar lymphadenopathy although assessment is limited by the lack of intravenous contrast on today's study. The esophagus has normal imaging features. There is no axillary lymphadenopathy. Lungs/Pleura: The central tracheobronchial airways are patent. Centrilobular and paraseptal emphysema again noted.  Bandlike opacity in the right upper lobe is similar to prior and compatible with scarring from previous radiation therapy. Ground-glass opacity posterior right lower lobe is similar. Architectural distortion and nodularity in the left apex is unchanged. 1.9 cm ground-glass nodule in the anterior left upper lobe measured previously is stable at 1.9 cm today. Upper Abdomen: Small hiatal hernia.  Unremarkable. Musculoskeletal: No worrisome lytic or sclerotic osseous abnormality. IMPRESSION: 1. Stable exam.  No new or progressive interval findings. 2. Post radiation scarring right upper lobe. 3. Previously characterized ground-glass nodules in the left upper and right lower lobes are unchanged. 4.  Aortic Atherosclerois (ICD10-170.0) 5.  Emphysema. ([id number]) Electronically Signed   By: Misty Stanley M.D.   On: 10/12/2017 16:51   ASSESSMENT AND PLAN:  This is a very pleasant 82 years old white male with unresectable a stage IIb non-small cell lung cancer, adenocarcinoma with EGFR amplification and 2 separate lesions in the right upper lobe status post stereotactic radiotherapy. He is currently on observation and feeling fine. Repeat CT scan of the chest showed no concerning findings for disease  recurrence or progression. I discussed the scan results with the patient and his wife and recommended for him to continue on observation with repeat CT scan of the chest in 6 months. He was advised to call immediately if he has any concerning symptoms in the interval. The patient voices understanding of current disease status and treatment options and is in agreement with the current care plan. All questions were answered. The patient knows to call the clinic with any problems, questions or concerns. We can certainly see the patient much sooner if necessary. I spent 10 minutes counseling the patient face to face. The total time spent in the appointment was 15 minutes.  Disclaimer: This note was dictated with voice  recognition software. Similar sounding words can inadvertently be transcribed and may not be corrected upon review.

## 2017-11-24 DIAGNOSIS — Z0001 Encounter for general adult medical examination with abnormal findings: Secondary | ICD-10-CM | POA: Diagnosis not present

## 2017-11-24 DIAGNOSIS — Z23 Encounter for immunization: Secondary | ICD-10-CM | POA: Diagnosis not present

## 2017-11-24 DIAGNOSIS — Z1389 Encounter for screening for other disorder: Secondary | ICD-10-CM | POA: Diagnosis not present

## 2017-11-24 DIAGNOSIS — Z6824 Body mass index (BMI) 24.0-24.9, adult: Secondary | ICD-10-CM | POA: Diagnosis not present

## 2017-12-08 DIAGNOSIS — Z961 Presence of intraocular lens: Secondary | ICD-10-CM | POA: Diagnosis not present

## 2017-12-08 DIAGNOSIS — H5213 Myopia, bilateral: Secondary | ICD-10-CM | POA: Diagnosis not present

## 2017-12-08 DIAGNOSIS — H52203 Unspecified astigmatism, bilateral: Secondary | ICD-10-CM | POA: Diagnosis not present

## 2017-12-08 DIAGNOSIS — H524 Presbyopia: Secondary | ICD-10-CM | POA: Diagnosis not present

## 2017-12-25 DIAGNOSIS — E782 Mixed hyperlipidemia: Secondary | ICD-10-CM | POA: Diagnosis not present

## 2017-12-25 DIAGNOSIS — C188 Malignant neoplasm of overlapping sites of colon: Secondary | ICD-10-CM | POA: Diagnosis not present

## 2017-12-25 DIAGNOSIS — C3490 Malignant neoplasm of unspecified part of unspecified bronchus or lung: Secondary | ICD-10-CM | POA: Diagnosis not present

## 2017-12-25 DIAGNOSIS — E039 Hypothyroidism, unspecified: Secondary | ICD-10-CM | POA: Diagnosis not present

## 2017-12-25 DIAGNOSIS — I4891 Unspecified atrial fibrillation: Secondary | ICD-10-CM | POA: Diagnosis not present

## 2017-12-25 DIAGNOSIS — Z125 Encounter for screening for malignant neoplasm of prostate: Secondary | ICD-10-CM | POA: Diagnosis not present

## 2017-12-25 DIAGNOSIS — F419 Anxiety disorder, unspecified: Secondary | ICD-10-CM | POA: Diagnosis not present

## 2017-12-25 DIAGNOSIS — Z6823 Body mass index (BMI) 23.0-23.9, adult: Secondary | ICD-10-CM | POA: Diagnosis not present

## 2017-12-25 DIAGNOSIS — J449 Chronic obstructive pulmonary disease, unspecified: Secondary | ICD-10-CM | POA: Diagnosis not present

## 2017-12-25 DIAGNOSIS — R7309 Other abnormal glucose: Secondary | ICD-10-CM | POA: Diagnosis not present

## 2018-04-15 ENCOUNTER — Encounter: Payer: Self-pay | Admitting: Cardiovascular Disease

## 2018-04-15 ENCOUNTER — Ambulatory Visit (INDEPENDENT_AMBULATORY_CARE_PROVIDER_SITE_OTHER): Payer: Medicare Other | Admitting: Cardiovascular Disease

## 2018-04-15 VITALS — BP 150/76 | HR 70 | Ht 72.0 in | Wt 169.8 lb

## 2018-04-15 DIAGNOSIS — R600 Localized edema: Secondary | ICD-10-CM

## 2018-04-15 DIAGNOSIS — I480 Paroxysmal atrial fibrillation: Secondary | ICD-10-CM | POA: Diagnosis not present

## 2018-04-15 DIAGNOSIS — I10 Essential (primary) hypertension: Secondary | ICD-10-CM | POA: Diagnosis not present

## 2018-04-15 DIAGNOSIS — E7800 Pure hypercholesterolemia, unspecified: Secondary | ICD-10-CM

## 2018-04-15 MED ORDER — FUROSEMIDE 40 MG PO TABS: 40.0000 mg | ORAL_TABLET | Freq: Two times a day (BID) | ORAL | 6 refills | Status: DC

## 2018-04-15 MED ORDER — POTASSIUM CHLORIDE CRYS ER 20 MEQ PO TBCR: 20.0000 meq | EXTENDED_RELEASE_TABLET | Freq: Every day | ORAL | 6 refills | Status: DC

## 2018-04-15 NOTE — Patient Instructions (Signed)
Medication Instructions:   Increase Lasix to 40mg  twice a day.  Begin Potassium 37meq daily.   Continue all other medications.    Labwork:  BMET - order given today.   Please do on Monday, 04/19/18.  Testing/Procedures: Your physician has requested that you have an echocardiogram. Echocardiography is a painless test that uses sound waves to create images of your heart. It provides your doctor with information about the size and shape of your heart and how well your heart's chambers and valves are working. This procedure takes approximately one hour. There are no restrictions for this procedure.  Follow-Up:  Office will contact with results via phone or letter.    3 months   Any Other Special Instructions Will Be Listed Below (If Applicable).  If you need a refill on your cardiac medications before your next appointment, please call your pharmacy.

## 2018-04-15 NOTE — Addendum Note (Signed)
Addended by: Laurine Blazer on: 04/15/2018 03:26 PM   Modules accepted: Orders

## 2018-04-15 NOTE — Progress Notes (Addendum)
SUBJECTIVE: The patient presents for annual follow-up.  He has a history of paroxysmal atrial fibrillation, lung cancer, hypertension, chronic lower extremity edema, and hyperlipidemia. He was hospitalized in May 2016 for acute hypoxic respiratory failure secondary to community-acquired pneumonia. He had some very mild demand ischemia (troponins peaked at 0.16) at that time as well as atrial fibrillation.  Echocardiogram on 07/27/14 demonstrated normal left ventricular systolic function and regional wall motion, LVEF 60-65% , mild right atrial and right ventricular dilatation, indeterminate diastolic function , with aortic valve sclerosis without stenosis.  He denies chest pain, palpitations, shortness of breath, dizziness, orthopnea, and paroxysmal nocturnal dyspnea.  He struggles with bilateral leg edema and takes Lasix 40 mg daily.  ECG performed in the office today which I ordered and personally interpreted demonstrates normal sinus rhythm with right bundle branch block.   Review of Systems: As per "subjective", otherwise negative.  Allergies  Allergen Reactions  . Celebrex [Celecoxib] Other (See Comments)    Bothers the patients eyes.  . Tetracyclines & Related Other (See Comments)    Breaks out in mouth.    Current Outpatient Medications  Medication Sig Dispense Refill  . alprazolam (XANAX) 2 MG tablet Take 2 mg by mouth 3 (three) times daily.    Marland Kitchen aspirin EC 81 MG tablet Take 81 mg by mouth daily.    Marland Kitchen docusate sodium (COLACE) 100 MG capsule Take 100-200 mg by mouth daily as needed for mild constipation or moderate constipation.     . furosemide (LASIX) 40 MG tablet Take 40 mg by mouth daily.    Marland Kitchen levothyroxine (SYNTHROID, LEVOTHROID) 50 MCG tablet Take 50 mcg by mouth daily before breakfast.    . Saw Palmetto, Serenoa repens, (SAW PALMETTO BERRIES PO) Take 2 capsules by mouth daily.    . simvastatin (ZOCOR) 20 MG tablet Take 20 mg by mouth daily.     No current  facility-administered medications for this visit.     Past Medical History:  Diagnosis Date  . Acute respiratory failure with hypoxia ([place]) 07/26/2011  . Anxiety   . Bulla of lung ([place]) 07/28/2011  . Cavitary lesion of lung 07/28/2011   AFB smears x3 negative.  . Colon cancer ([place]) 1993  . COPD (chronic obstructive pulmonary disease) ([place])   . GERD (gastroesophageal reflux disease)    Hx: of  . Headache(784.0)   . High cholesterol   . Hypertension   . Lung cancer ([place])   . Numbness and tingling    Hx: of in right hand and B/LLE  . Pneumonia   . Rapid atrial fibrillation ([place]) 07/28/2011   Paroxysmal, newly diagnosed.  . Tobacco abuse 08/01/2011    Past Surgical History:  Procedure Laterality Date  . APPENDECTOMY    . CERVICAL DISC SURGERY    . COLON SURGERY    . EYE SURGERY     as a child  . TONSILLECTOMY    . VIDEO BRONCHOSCOPY WITH ENDOBRONCHIAL NAVIGATION N/A 05/18/2013   Procedure: VIDEO BRONCHOSCOPY WITH ENDOBRONCHIAL NAVIGATION;  Surgeon: [name], MD;  Location: [facility] OR;  Service: Thoracic;  Laterality: N/A;    Social History   Socioeconomic History  . Marital status: Married    Spouse name: Not on file  . Number of children: Not on file  . Years of education: Not on file  . Highest education level: Not on file  Occupational History  . Not on file  Social Needs  . Financial resource strain: Not  on file  . Food insecurity:    Worry: Not on file    Inability: Not on file  . Transportation needs:    Medical: Not on file    Non-medical: Not on file  Tobacco Use  . Smoking status: Former Smoker    Packs/day: 1.00    Years: 50.00    Pack years: 50.00    Types: Cigarettes    Last attempt to quit: 07/16/2011    Years since quitting: 6.7  . Smokeless tobacco: Never Used  . Tobacco comment: started using electric cigarettes in 06-2010  Substance and Sexual Activity  . Alcohol use: No  . Drug use: No  . Sexual activity: Not Currently  Lifestyle  .  Physical activity:    Days per week: Not on file    Minutes per session: Not on file  . Stress: Not on file  Relationships  . Social connections:    Talks on phone: Not on file    Gets together: Not on file    Attends religious service: Not on file    Active member of club or organization: Not on file    Attends meetings of clubs or organizations: Not on file    Relationship status: Not on file  . Intimate partner violence:    Fear of current or ex partner: Not on file    Emotionally abused: Not on file    Physically abused: Not on file    Forced sexual activity: Not on file  Other Topics Concern  . Not on file  Social History Narrative  . Not on file     Vitals:   04/15/18 1450  Weight: 169 lb 12.8 oz (77 kg)  Height: 6' (1.829 m)    Wt Readings from Last 3 Encounters:  04/15/18 169 lb 12.8 oz (77 kg)  10/19/17 172 lb 9.6 oz (78.3 kg)  04/13/17 170 lb 4.8 oz (77.2 kg)     PHYSICAL EXAM General: NAD HEENT: Normal. Neck: No JVD, no thyromegaly. Lungs: Clear to auscultation bilaterally with normal respiratory effort. CV: Regular rate and rhythm, normal S1/S2, no S3/S4, no murmur.  Tense bilateral lower extremity edema with venous stasis.   Abdomen: Soft, nontender, no distention.  Neurologic: Alert and oriented.  Psych: Normal affect. Skin: Normal. Musculoskeletal: No gross deformities.    ECG: Reviewed above under Subjective   Labs: Lab Results  Component Value Date/Time   K 5.0 10/12/2017 02:35 PM   K 4.2 09/30/2016 12:44 PM   BUN 21 10/12/2017 02:35 PM   BUN 21.7 09/30/2016 12:44 PM   CREATININE 1.10 10/12/2017 02:35 PM   CREATININE 1.1 09/30/2016 12:44 PM   ALT 7 10/12/2017 02:35 PM   ALT 7 09/30/2016 12:44 PM   TSH 1.631 07/28/2011 04:14 AM   HGB 13.6 10/12/2017 02:35 PM   HGB 13.7 09/30/2016 12:44 PM     Lipids: Lab Results  Component Value Date/Time   LDLCALC 93 07/06/2012 12:20 PM   CHOL 160 07/06/2012 12:20 PM   TRIG 97 07/06/2012  12:20 PM   HDL 48 07/06/2012 12:20 PM       ASSESSMENT AND PLAN: 1.  Paroxysmal atrial fibrillation: Symptomatically stable and without recurrence.  He previously deferred anticoagulation.  He is currently on aspirin 81 mg.  He had been on metoprolol at his last visit but is no longer taking this as his PCP stopped it and he is uncertain why.  2.  Hypertension: BP is elevated.  I will monitor given  increased diuretic dose.  3.  Hyperlipidemia: Continue simvastatin.  4.  Bilateral leg edema: Likely related to chronic venous insufficiency.  I will order a 2-D echocardiogram with Doppler to evaluate cardiac structure, function, and regional wall motion. Currently on Lasix 40 mg daily.  I will increase to 40 mg twice daily and start supplemental potassium chloride 20 mEq daily.  I will check a basic metabolic panel within the next few days.   Disposition: Follow up 3 months   [name], M.D., F.A.C.C.

## 2018-04-16 DIAGNOSIS — Z6823 Body mass index (BMI) 23.0-23.9, adult: Secondary | ICD-10-CM | POA: Diagnosis not present

## 2018-04-16 DIAGNOSIS — F419 Anxiety disorder, unspecified: Secondary | ICD-10-CM | POA: Diagnosis not present

## 2018-04-21 ENCOUNTER — Inpatient Hospital Stay: Payer: Medicare Other | Attending: Internal Medicine

## 2018-04-21 ENCOUNTER — Ambulatory Visit (HOSPITAL_COMMUNITY)
Admission: RE | Admit: 2018-04-21 | Discharge: 2018-04-21 | Disposition: A | Discharge status: Home / Self Care | Payer: Medicare Other | Source: Ambulatory Visit | Attending: Internal Medicine | Admitting: Internal Medicine

## 2018-04-21 ENCOUNTER — Other Ambulatory Visit: Payer: Self-pay | Admitting: Cardiovascular Disease

## 2018-04-21 ENCOUNTER — Encounter (HOSPITAL_COMMUNITY): Payer: Self-pay

## 2018-04-21 DIAGNOSIS — I4891 Unspecified atrial fibrillation: Secondary | ICD-10-CM

## 2018-04-21 DIAGNOSIS — E7800 Pure hypercholesterolemia, unspecified: Secondary | ICD-10-CM | POA: Insufficient documentation

## 2018-04-21 DIAGNOSIS — C3490 Malignant neoplasm of unspecified part of unspecified bronchus or lung: Secondary | ICD-10-CM

## 2018-04-21 DIAGNOSIS — I110 Hypertensive heart disease with heart failure: Secondary | ICD-10-CM | POA: Insufficient documentation

## 2018-04-21 DIAGNOSIS — Z923 Personal history of irradiation: Secondary | ICD-10-CM | POA: Diagnosis not present

## 2018-04-21 DIAGNOSIS — Z7982 Long term (current) use of aspirin: Secondary | ICD-10-CM | POA: Diagnosis not present

## 2018-04-21 DIAGNOSIS — Z85038 Personal history of other malignant neoplasm of large intestine: Secondary | ICD-10-CM | POA: Diagnosis not present

## 2018-04-21 DIAGNOSIS — F419 Anxiety disorder, unspecified: Secondary | ICD-10-CM | POA: Diagnosis not present

## 2018-04-21 DIAGNOSIS — C3411 Malignant neoplasm of upper lobe, right bronchus or lung: Secondary | ICD-10-CM | POA: Insufficient documentation

## 2018-04-21 DIAGNOSIS — R600 Localized edema: Secondary | ICD-10-CM

## 2018-04-21 DIAGNOSIS — Z79899 Other long term (current) drug therapy: Secondary | ICD-10-CM | POA: Insufficient documentation

## 2018-04-21 DIAGNOSIS — J8410 Pulmonary fibrosis, unspecified: Secondary | ICD-10-CM | POA: Diagnosis not present

## 2018-04-21 LAB — CMP (CANCER CENTER ONLY)
ALT: 7 U/L (ref 0–44)
ANION GAP: 9 (ref 5–15)
AST: 16 U/L (ref 15–41)
Albumin: 3.7 g/dL (ref 3.5–5.0)
Alkaline Phosphatase: 106 U/L (ref 38–126)
BUN: 14 mg/dL (ref 8–23)
CHLORIDE: 101 mmol/L (ref 98–111)
CO2: 35 mmol/L — AB (ref 22–32)
CREATININE: 1.22 mg/dL (ref 0.61–1.24)
Calcium: 8.8 mg/dL — ABNORMAL LOW (ref 8.9–10.3)
GFR, EST NON AFRICAN AMERICAN: 56 mL/min — AB (ref >60)
Glucose, Bld: 98 mg/dL (ref 70–99)
Potassium: 4.1 mmol/L (ref 3.5–5.1)
SODIUM: 145 mmol/L (ref 135–145)
Total Bilirubin: 0.8 mg/dL (ref 0.3–1.2)
Total Protein: 7 g/dL (ref 6.5–8.1)

## 2018-04-21 LAB — CBC WITH DIFFERENTIAL (CANCER CENTER ONLY)
Abs Immature Granulocytes: 0.02 10*3/uL (ref 0.00–0.07)
BASOS ABS: 0 10*3/uL (ref 0.0–0.1)
Basophils Relative: 1 %
EOS ABS: 0.4 10*3/uL (ref 0.0–0.5)
EOS PCT: 7 %
HCT: 45.5 % (ref 39.0–52.0)
HEMOGLOBIN: 14 g/dL (ref 13.0–17.0)
IMMATURE GRANULOCYTES: 0 %
LYMPHS ABS: 1.8 10*3/uL (ref 0.7–4.0)
LYMPHS PCT: 30 %
MCH: 28.8 pg (ref 26.0–34.0)
MCHC: 30.8 g/dL (ref 30.0–36.0)
MCV: 93.6 fL (ref 80.0–100.0)
Monocytes Absolute: 0.5 10*3/uL (ref 0.1–1.0)
Monocytes Relative: 9 %
NEUTROS PCT: 53 %
NRBC: 0 % (ref 0.0–0.2)
Neutro Abs: 3.2 10*3/uL (ref 1.7–7.7)
Platelet Count: 157 10*3/uL (ref 150–400)
RBC: 4.86 MIL/uL (ref 4.22–5.81)
RDW: 13.2 % (ref 11.5–15.5)
WBC: 6 10*3/uL (ref 4.0–10.5)

## 2018-04-21 MED ORDER — SODIUM CHLORIDE (PF) 0.9 % IJ SOLN
INTRAMUSCULAR | Status: AC
  Filled 2018-04-21: qty 50

## 2018-04-21 MED ORDER — IOHEXOL 300 MG/ML  SOLN
75.0000 mL | Freq: Once | INTRAMUSCULAR | Status: AC | PRN
  Administered 2018-04-21: 75 mL via INTRAVENOUS

## 2018-04-26 ENCOUNTER — Telehealth: Payer: Self-pay | Admitting: Internal Medicine

## 2018-04-26 ENCOUNTER — Inpatient Hospital Stay (HOSPITAL_BASED_OUTPATIENT_CLINIC_OR_DEPARTMENT_OTHER): Payer: Medicare Other | Admitting: Internal Medicine

## 2018-04-26 ENCOUNTER — Encounter: Payer: Self-pay | Admitting: Internal Medicine

## 2018-04-26 VITALS — BP 158/72 | HR 89 | Temp 97.7°F | Resp 18 | Ht 72.0 in | Wt 161.9 lb

## 2018-04-26 DIAGNOSIS — Z79899 Other long term (current) drug therapy: Secondary | ICD-10-CM

## 2018-04-26 DIAGNOSIS — I4891 Unspecified atrial fibrillation: Secondary | ICD-10-CM

## 2018-04-26 DIAGNOSIS — Z85038 Personal history of other malignant neoplasm of large intestine: Secondary | ICD-10-CM | POA: Diagnosis not present

## 2018-04-26 DIAGNOSIS — Z923 Personal history of irradiation: Secondary | ICD-10-CM

## 2018-04-26 DIAGNOSIS — C3411 Malignant neoplasm of upper lobe, right bronchus or lung: Secondary | ICD-10-CM | POA: Diagnosis not present

## 2018-04-26 DIAGNOSIS — I10 Essential (primary) hypertension: Secondary | ICD-10-CM

## 2018-04-26 DIAGNOSIS — I110 Hypertensive heart disease with heart failure: Secondary | ICD-10-CM

## 2018-04-26 DIAGNOSIS — C3490 Malignant neoplasm of unspecified part of unspecified bronchus or lung: Secondary | ICD-10-CM

## 2018-04-26 DIAGNOSIS — Z7982 Long term (current) use of aspirin: Secondary | ICD-10-CM | POA: Diagnosis not present

## 2018-04-26 DIAGNOSIS — F419 Anxiety disorder, unspecified: Secondary | ICD-10-CM

## 2018-04-26 DIAGNOSIS — E7800 Pure hypercholesterolemia, unspecified: Secondary | ICD-10-CM

## 2018-04-26 NOTE — Telephone Encounter (Signed)
Gave avs and calendar ° °

## 2018-04-26 NOTE — Progress Notes (Signed)
[place] Telephone:([phone]) [phone]   Fax:(336) [phone] OFFICE PROGRESS NOTE  [name], [address] [place] [address]  DIAGNOSIS: Unresectable he stage IIB (T3, N0, M0) non-small cell lung cancer, adenocarcinoma with positive EGFR amplification (G598V, G719A) diagnosed in March of 2015.  PRIOR THERAPY: Stereotactic radiotherapy to the right upper lobe pulmonary nodules under the care of Dr. Tammi Klippel completed on 07/26/2013.  CURRENT THERAPY: Observation.  INTERVAL HISTORY: Adam Ward 83 y.o. male returns to the clinic today for 6 months follow-up visit accompanied by his wife.  The patient is feeling fine today with no concerning complaints except for mild fatigue.  He lost around 9 pounds recently secondary to diuresis for congestive heart failure.  He denied having any chest pain, shortness of breath, cough or hemoptysis.  He denied having any fever or chills.  He has no nausea, vomiting, diarrhea or constipation.  He had repeat CT scan of the chest performed recently and he is here for evaluation and discussion of his scan results.  MEDICAL HISTORY: Past Medical History:  Diagnosis Date  . Acute respiratory failure with hypoxia ([facility]) 07/26/2011  . Anxiety   . Bulla of lung ([place]) 07/28/2011  . Cavitary lesion of lung 07/28/2011   AFB smears x3 negative.  . Colon cancer ([place]) 1993  . COPD (chronic obstructive pulmonary disease) ([facility])   . GERD (gastroesophageal reflux disease)    Hx: of  . Headache(784.0)   . High cholesterol   . Hypertension   . Lung cancer ([place])   . Numbness and tingling    Hx: of in right hand and B/LLE  . Pneumonia   . Rapid atrial fibrillation ([place]) 07/28/2011   Paroxysmal, newly diagnosed.  . Tobacco abuse 08/01/2011    ALLERGIES:  is allergic to celebrex [celecoxib] and tetracyclines & related.  MEDICATIONS:  Current Outpatient Medications  Medication Sig Dispense Refill  . alprazolam (XANAX) 2 MG tablet Take  2 mg by mouth 3 (three) times daily.    Marland Kitchen aspirin EC 81 MG tablet Take 81 mg by mouth daily.    Marland Kitchen docusate sodium (COLACE) 100 MG capsule Take 100-200 mg by mouth daily as needed for mild constipation or moderate constipation.     . furosemide (LASIX) 40 MG tablet Take 1 tablet (40 mg total) by mouth 2 (two) times daily. 60 tablet 6  . levothyroxine (SYNTHROID, LEVOTHROID) 50 MCG tablet Take 50 mcg by mouth daily before breakfast.    . potassium chloride SA (K-DUR,KLOR-CON) 20 MEQ tablet Take 1 tablet (20 mEq total) by mouth daily. 30 tablet 6  . simvastatin (ZOCOR) 20 MG tablet Take 20 mg by mouth daily.     No current facility-administered medications for this visit.     SURGICAL HISTORY:  Past Surgical History:  Procedure Laterality Date  . APPENDECTOMY    . CERVICAL DISC SURGERY    . COLON SURGERY    . EYE SURGERY     as a child  . TONSILLECTOMY    . VIDEO BRONCHOSCOPY WITH ENDOBRONCHIAL NAVIGATION N/A 05/18/2013   Procedure: VIDEO BRONCHOSCOPY WITH ENDOBRONCHIAL NAVIGATION;  Surgeon: [name], MD;  Location: [facility] OR;  Service: Thoracic;  Laterality: N/A;    REVIEW OF SYSTEMS:  A comprehensive review of systems was negative except for: Constitutional: positive for fatigue   PHYSICAL EXAMINATION: General appearance: alert, cooperative, fatigued and no distress Head: Normocephalic, without obvious abnormality, atraumatic Neck: no adenopathy, no JVD, supple, symmetrical,  trachea midline and thyroid not enlarged, symmetric, no tenderness/mass/nodules Lymph nodes: Cervical, supraclavicular, and axillary nodes normal. Resp: clear to auscultation bilaterally Back: symmetric, no curvature. ROM normal. No CVA tenderness. Cardio: regular rate and rhythm, S1, S2 normal, no murmur, click, rub or gallop GI: soft, non-tender; bowel sounds normal; no masses,  no organomegaly Extremities: extremities normal, atraumatic, no cyanosis or edema  ECOG PERFORMANCE STATUS: 1 - Symptomatic but  completely ambulatory  Blood pressure (!) 158/72, pulse 89, temperature 97.7 F (36.5 C), temperature source Oral, resp. rate 18, height 6' (1.829 m), weight 161 lb 14.4 oz (73.4 kg), SpO2 93 %.  LABORATORY DATA: Lab Results  Component Value Date   WBC 6.0 04/21/2018   HGB 14.0 04/21/2018   HCT 45.5 04/21/2018   MCV 93.6 04/21/2018   PLT 157 04/21/2018      Chemistry      Component Value Date/Time   NA 145 04/21/2018 1305   NA 142 09/30/2016 1244   K 4.1 04/21/2018 1305   K 4.2 09/30/2016 1244   CL 101 04/21/2018 1305   CO2 35 (H) 04/21/2018 1305   CO2 35 (H) 09/30/2016 1244   BUN 14 04/21/2018 1305   BUN 21.7 09/30/2016 1244   CREATININE 1.22 04/21/2018 1305   CREATININE 1.1 09/30/2016 1244      Component Value Date/Time   CALCIUM 8.8 (L) 04/21/2018 1305   CALCIUM 9.1 09/30/2016 1244   ALKPHOS 106 04/21/2018 1305   ALKPHOS 112 09/30/2016 1244   AST 16 04/21/2018 1305   AST 21 09/30/2016 1244   ALT 7 04/21/2018 1305   ALT 7 09/30/2016 1244   BILITOT 0.8 04/21/2018 1305   BILITOT 0.44 09/30/2016 1244       RADIOGRAPHIC STUDIES: Ct Chest W Contrast  Result Date: 04/22/2018 CLINICAL DATA:  Stage IIB right upper lobe lung adenocarcinoma diagnosed March 2015 status post radiation therapy. Patient presents for restaging on interval observation. Additional history of colon cancer. EXAM: CT CHEST WITH CONTRAST TECHNIQUE: Multidetector CT imaging of the chest was performed during intravenous contrast administration. CONTRAST:  81m OMNIPAQUE IOHEXOL 300 MG/ML  SOLN COMPARISON:  10/12/2017 chest CT. FINDINGS: Cardiovascular: Normal heart size. No significant pericardial effusion/thickening. Left anterior descending coronary atherosclerosis. Atherosclerotic nonaneurysmal thoracic aorta. Normal caliber pulmonary arteries. No central pulmonary emboli. Mediastinum/Nodes: No discrete thyroid nodules. Unremarkable esophagus. No pathologically enlarged axillary, mediastinal or hilar  lymph nodes. Lungs/Pleura: No pneumothorax. No pleural effusion. Moderate centrilobular and paraseptal emphysema with diffuse bronchial wall thickening. Sub solid 2.3 cm left upper lobe nodule (series 7/image 63), previously 2.3 cm using similar measurement technique, stable. Ground-glass pulmonary nodules in the right lower lobe, largest 3.7 cm (series 7/image 87), all stable using similar measurement technique. Stable sharply marginated bandlike consolidation in the peripheral right upper lobe with associated volume loss and distortion, compatible with radiation fibrosis. No acute consolidative airspace disease or new significant pulmonary nodules. Upper abdomen: Small hiatal hernia. Musculoskeletal: No aggressive appearing focal osseous lesions. Symmetric mild gynecomastia, stable. Partially visualized surgical hardware from ACDF in the lower cervical spine. IMPRESSION: 1. Stable right upper lobe radiation fibrosis with no evidence of local tumor recurrence. 2. Stable subsolid left upper lobe and ground-glass right lower lobe pulmonary nodules. No new or progressive findings. Aortic Atherosclerosis (ICD10-I70.0) and Emphysema (ICD10-J43.9). Electronically Signed   By: JIlona SorrelM.D.   On: 04/22/2018 08:57   ASSESSMENT AND PLAN:  This is a very pleasant 83years old white male with unresectable a stage IIb non-small  cell lung cancer, adenocarcinoma with EGFR amplification and 2 separate lesions in the right upper lobe status post stereotactic radiotherapy. The patient is currently on observation and he is feeling fine. He had repeat CT scan of the chest performed recently that showed no concerning findings for disease recurrence or progression. I discussed the scan results with the patient and his wife and recommended for him to continue on observation with repeat CT scan of the chest in 6 months. The patient was advised to call immediately if he has any concerning symptoms in the interval. The patient  voices understanding of current disease status and treatment options and is in agreement with the current care plan. All questions were answered. The patient knows to call the clinic with any problems, questions or concerns. We can certainly see the patient much sooner if necessary. I spent 10 minutes counseling the patient face to face. The total time spent in the appointment was 15 minutes.  Disclaimer: This note was dictated with voice recognition software. Similar sounding words can inadvertently be transcribed and may not be corrected upon review.

## 2018-04-28 ENCOUNTER — Encounter

## 2018-04-28 ENCOUNTER — Ambulatory Visit (INDEPENDENT_AMBULATORY_CARE_PROVIDER_SITE_OTHER): Payer: Medicare Other

## 2018-04-28 DIAGNOSIS — R600 Localized edema: Secondary | ICD-10-CM | POA: Diagnosis not present

## 2018-04-28 DIAGNOSIS — I4891 Unspecified atrial fibrillation: Secondary | ICD-10-CM | POA: Diagnosis not present

## 2018-04-30 ENCOUNTER — Telehealth: Payer: Self-pay | Admitting: *Deleted

## 2018-04-30 NOTE — Telephone Encounter (Signed)
Notes recorded by Laurine Blazer, LPN on 1/42/7670 at 1:10 PM EST Patient notified. Copy to pmd. Follow up scheduled for 07/27/18 [facility] office.   ------  Notes recorded by Herminio Commons, MD on 04/30/2018 at 3:23 PM EST Normal pumping function. Mild aortic valve leakage.

## 2018-07-12 DIAGNOSIS — F419 Anxiety disorder, unspecified: Secondary | ICD-10-CM | POA: Diagnosis not present

## 2018-07-12 DIAGNOSIS — Z6822 Body mass index (BMI) 22.0-22.9, adult: Secondary | ICD-10-CM | POA: Diagnosis not present

## 2018-07-23 ENCOUNTER — Telehealth: Payer: Self-pay | Admitting: Cardiovascular Disease

## 2018-07-23 NOTE — Telephone Encounter (Signed)
Virtual Visit Pre-Appointment Phone Call  "(Name), I am calling you today to discuss your upcoming appointment. We are currently trying to limit exposure to the virus that causes COVID-19 by seeing patients at home rather than in the office."  1. "What is the BEST phone number to call the day of the visit?" - include this in appointment notes  2. Do you have or have access to (through a family member/friend) a smartphone with video capability that we can use for your visit?" a. If yes - list this number in appt notes as cell (if different from BEST phone #) and list the appointment type as a VIDEO visit in appointment notes b. If no - list the appointment type as a PHONE visit in appointment notes  3. Confirm consent - "In the setting of the current Covid19 crisis, you are scheduled for a (phone or video) visit with your provider on (date) at (time).  Just as we do with many in-office visits, in order for you to participate in this visit, we must obtain consent.  If you'd like, I can send this to your mychart (if signed up) or email for you to review.  Otherwise, I can obtain your verbal consent now.  All virtual visits are billed to your insurance company just like a normal visit would be.  By agreeing to a virtual visit, we'd like you to understand that the technology does not allow for your provider to perform an examination, and thus may limit your provider's ability to fully assess your condition. If your provider identifies any concerns that need to be evaluated in person, we will make arrangements to do so.  Finally, though the technology is pretty good, we cannot assure that it will always work on either your or our end, and in the setting of a video visit, we may have to convert it to a phone-only visit.  In either situation, we cannot ensure that we have a secure connection.  Are you willing to proceed?" STAFF: Did the patient verbally acknowledge consent to telehealth visit? Document  YES/NO here: YES  4. Advise patient to be prepared - "Two hours prior to your appointment, go ahead and check your blood pressure, pulse, oxygen saturation, and your weight (if you have the equipment to check those) and write them all down. When your visit starts, your provider will ask you for this information. If you have an Apple Watch or Kardia device, please plan to have heart rate information ready on the day of your appointment. Please have a pen and paper handy nearby the day of the visit as well."  5. Give patient instructions for MyChart download to smartphone OR Doximity/Doxy.me as below if video visit (depending on what platform provider is using)  6. Inform patient they will receive a phone call 15 minutes prior to their appointment time (may be from unknown caller ID) so they should be prepared to answer    TELEPHONE CALL NOTE  Adam Ward has been deemed a candidate for a follow-up tele-health visit to limit community exposure during the Covid-19 pandemic. I spoke with the patient via phone to ensure availability of phone/video source, confirm preferred email & phone number, and discuss instructions and expectations.  I reminded Adam Ward to be prepared with any vital sign and/or heart rhythm information that could potentially be obtained via home monitoring, at the time of his visit. I reminded Adam Ward to expect a phone call prior to  his visit.  Adam Ward 07/23/2018 11:01 AM   INSTRUCTIONS FOR DOWNLOADING THE MYCHART APP TO SMARTPHONE  - The patient must first make sure to have activated MyChart and know their login information - If Apple, go to [employer] and type in MyChart in the search bar and download the app. If Android, ask patient to go to [employer] and type in [place] in the search bar and download the app. The app is free but as with any other app downloads, their phone may require them to verify saved payment information or Apple/Android  password.  - The patient will need to then log into the app with their MyChart username and password, and select [facility] as their healthcare provider to link the account. When it is time for your visit, go to the MyChart app, find appointments, and click Begin Video Visit. Be sure to Select Allow for your device to access the Microphone and Camera for your visit. You will then be connected, and your provider will be with you shortly.  **If they have any issues connecting, or need assistance please contact MyChart service desk [phone])**  **If using a computer, in order to ensure the best quality for their visit they will need to use either of the following Internet Browsers: [employer], or Google Chrome**  IF USING DOXIMITY or DOXY.ME - The patient will receive a link just prior to their visit by text.     FULL LENGTH CONSENT FOR TELE-HEALTH VISIT   I hereby voluntarily request, consent and authorize [facility] and its employed or contracted physicians, physician assistants, nurse practitioners or other licensed health care professionals (the Practitioner), to provide me with telemedicine health care services (the Services") as deemed necessary by the treating Practitioner. I acknowledge and consent to receive the Services by the Practitioner via telemedicine. I understand that the telemedicine visit will involve communicating with the Practitioner through live audiovisual communication technology and the disclosure of certain medical information by electronic transmission. I acknowledge that I have been given the opportunity to request an in-person assessment or other available alternative prior to the telemedicine visit and am voluntarily participating in the telemedicine visit.  I understand that I have the right to withhold or withdraw my consent to the use of telemedicine in the course of my care at any time, without affecting my right to future care or treatment,  and that the Practitioner or I may terminate the telemedicine visit at any time. I understand that I have the right to inspect all information obtained and/or recorded in the course of the telemedicine visit and may receive copies of available information for a reasonable fee.  I understand that some of the potential risks of receiving the Services via telemedicine include:   Delay or interruption in medical evaluation due to technological equipment failure or disruption;  Information transmitted may not be sufficient (e.g. poor resolution of images) to allow for appropriate medical decision making by the Practitioner; and/or   In rare instances, security protocols could fail, causing a breach of personal health information.  Furthermore, I acknowledge that it is my responsibility to provide information about my medical history, conditions and care that is complete and accurate to the best of my ability. I acknowledge that Practitioner's advice, recommendations, and/or decision may be based on factors not within their control, such as incomplete or inaccurate data provided by me or distortions of diagnostic images or specimens that may result from electronic transmissions. I  understand that the practice of medicine is not an exact science and that Practitioner makes no warranties or guarantees regarding treatment outcomes. I acknowledge that I will receive a copy of this consent concurrently upon execution via email to the email address I last provided but may also request a printed copy by calling the office of [facility].    I understand that my insurance will be billed for this visit.   I have read or had this consent read to me.  I understand the contents of this consent, which adequately explains the benefits and risks of the Services being provided via telemedicine.   I have been provided ample opportunity to ask questions regarding this consent and the Services and have had my questions  answered to my satisfaction.  I give my informed consent for the services to be provided through the use of telemedicine in my medical care  By participating in this telemedicine visit I agree to the above.

## 2018-07-27 ENCOUNTER — Telehealth (INDEPENDENT_AMBULATORY_CARE_PROVIDER_SITE_OTHER): Payer: Medicare Other | Admitting: Cardiovascular Disease

## 2018-07-27 ENCOUNTER — Other Ambulatory Visit: Payer: Self-pay

## 2018-07-27 ENCOUNTER — Encounter: Payer: Self-pay | Admitting: Cardiovascular Disease

## 2018-07-27 VITALS — BP 129/69 | HR 79 | Ht 73.0 in | Wt 160.2 lb

## 2018-07-27 DIAGNOSIS — I480 Paroxysmal atrial fibrillation: Secondary | ICD-10-CM

## 2018-07-27 DIAGNOSIS — E7800 Pure hypercholesterolemia, unspecified: Secondary | ICD-10-CM

## 2018-07-27 DIAGNOSIS — R600 Localized edema: Secondary | ICD-10-CM | POA: Diagnosis not present

## 2018-07-27 DIAGNOSIS — I10 Essential (primary) hypertension: Secondary | ICD-10-CM

## 2018-07-27 NOTE — Progress Notes (Signed)
Virtual Visit via Telephone Note   This visit type was conducted due to national recommendations for restrictions regarding the COVID-19 Pandemic (e.g. social distancing) in an effort to limit this patient's exposure and mitigate transmission in our community.  Due to his co-morbid illnesses, this patient is at least at moderate risk for complications without adequate follow up.  This format is felt to be most appropriate for this patient at this time.  The patient did not have access to video technology/had technical difficulties with video requiring transitioning to audio format only (telephone).  All issues noted in this document were discussed and addressed.  No physical exam could be performed with this format.  Please refer to the patient's chart for his  consent to telehealth for [facility].   Date:  07/27/2018   ID:  Adam Ward, DOB 11/17/1935, MRN [id number]  Patient Location: Home Provider Location: Office  PCP:  [name], MD  Cardiologist:  [name], MD  Electrophysiologist:  None   Evaluation Performed:  Follow-Up Visit  Chief Complaint:  PAF  History of Present Illness:    Adam Ward is a 83 y.o. male with a history of paroxysmal atrial fibrillation, lung cancer, hypertension, chronic lower extremity edema, and hyperlipidemia.  The patient denies any symptoms of chest pain, palpitations, shortness of breath, lightheadedness, dizziness, orthopnea, PND, and syncope.  His leg swelling has improved. He had been drinking fluids with sodium but has since quit doing so.  The patient does not have symptoms concerning for COVID-19 infection (fever, chills, cough, or new shortness of breath).    Past Medical History:  Diagnosis Date  . Acute respiratory failure with hypoxia ([place]) 07/26/2011  . Anxiety   . Bulla of lung ([place]) 07/28/2011  . Cavitary lesion of lung 07/28/2011   AFB smears x3 negative.  . Colon cancer ([place]) 1993  . COPD (chronic obstructive  pulmonary disease) ([place])   . GERD (gastroesophageal reflux disease)    Hx: of  . Headache(784.0)   . High cholesterol   . Hypertension   . Lung cancer ([place])   . Numbness and tingling    Hx: of in right hand and B/LLE  . Pneumonia   . Rapid atrial fibrillation ([place]) 07/28/2011   Paroxysmal, newly diagnosed.  . Tobacco abuse 08/01/2011   Past Surgical History:  Procedure Laterality Date  . APPENDECTOMY    . CERVICAL DISC SURGERY    . COLON SURGERY    . EYE SURGERY     as a child  . TONSILLECTOMY    . VIDEO BRONCHOSCOPY WITH ENDOBRONCHIAL NAVIGATION N/A 05/18/2013   Procedure: VIDEO BRONCHOSCOPY WITH ENDOBRONCHIAL NAVIGATION;  Surgeon: [name], MD;  Location: [facility];  Service: Thoracic;  Laterality: N/A;     Current Meds  Medication Sig  . alprazolam (XANAX) 2 MG tablet Take 2 mg by mouth 3 (three) times daily.  Marland Kitchen aspirin EC 81 MG tablet Take 81 mg by mouth daily.  Marland Kitchen docusate sodium (COLACE) 100 MG capsule Take 100-200 mg by mouth daily as needed for mild constipation or moderate constipation.   . furosemide (LASIX) 40 MG tablet Take 1 tablet (40 mg total) by mouth 2 (two) times daily.  Marland Kitchen levothyroxine (SYNTHROID, LEVOTHROID) 50 MCG tablet Take 50 mcg by mouth daily before breakfast.  . potassium chloride SA (K-DUR,KLOR-CON) 20 MEQ tablet Take 1 tablet (20 mEq total) by mouth daily.  . simvastatin (ZOCOR) 20 MG tablet Take 20 mg by mouth daily.  Allergies:   Celebrex [celecoxib] and Tetracyclines & related   Social History   Tobacco Use  . Smoking status: Former Smoker    Packs/day: 1.00    Years: 50.00    Pack years: 50.00    Types: Cigarettes    Last attempt to quit: 07/16/2011    Years since quitting: 7.0  . Smokeless tobacco: Never Used  . Tobacco comment: started using electric cigarettes in 06-2010  Substance Use Topics  . Alcohol use: No  . Drug use: No     Family Hx: The patient's family history includes Heart disease in his brother and mother;  Pneumonia in his sister; Stroke in his father.  ROS:   Please see the history of present illness.     All other systems reviewed and are negative.   Prior CV studies:   The following studies were reviewed today:  Echocardiogram 04/28/2018:  1. The left ventricle has hyperdynamic systolic function, with an ejection fraction of >65%. The cavity size was normal. Left ventricular diastolic Doppler parameters are indeterminate.  2. The right ventricle has normal systolic function. The cavity was normal. There is no increase in right ventricular wall thickness.  3. The aortic valve was not well visualized Mild thickening of the aortic valve Mild calcification of the aortic valve. Aortic valve regurgitation is mild by color flow Doppler. no stenosis of the aortic valve. Moderate aortic annular calcification  noted.  4. The mitral valve is normal in structure. Mild thickening of the mitral valve leaflet. Mild calcification of the mitral valve leaflet. There is moderate mitral annular calcification present. No evidence of mitral valve stenosis.  5. The aortic root is normal in size and structure.  6. Pulmonary hypertension is indeterminant, inadequate TR.  7. The interatrial septum was not well visualized.  Labs/Other Tests and Data Reviewed:    EKG:  No ECG reviewed.  Recent Labs: 04/21/2018: ALT 7; BUN 14; Creatinine 1.22; Hemoglobin 14.0; Platelet Count 157; Potassium 4.1; Sodium 145   Recent Lipid Panel Lab Results  Component Value Date/Time   CHOL 160 07/06/2012 12:20 PM   TRIG 97 07/06/2012 12:20 PM   HDL 48 07/06/2012 12:20 PM   CHOLHDL 3.3 07/06/2012 12:20 PM   LDLCALC 93 07/06/2012 12:20 PM    Wt Readings from Last 3 Encounters:  07/27/18 160 lb 3.2 oz (72.7 kg)  04/26/18 161 lb 14.4 oz (73.4 kg)  04/15/18 169 lb 12.8 oz (77 kg)     Objective:    Vital Signs:  BP 129/69   Pulse 79   Ht 6\' 1"  (1.854 m)   Wt 160 lb 3.2 oz (72.7 kg)   SpO2 92%   BMI 21.14 kg/m     VITAL SIGNS:  reviewed  ASSESSMENT & PLAN:    1. Paroxysmal atrial fibrillation: Symptomatically stable and without recurrence. He previously deferred anticoagulation. He is currently on aspirin 81 mg. He had previously been on metoprolol but is no longer taking this as his PCP stopped it and he is uncertain why.  2. Hypertension: BP is normal. No changes to therapy.   3. Hyperlipidemia: Continue simvastatin.  4.  Bilateral leg edema: Likely related to chronic venous insufficiency.  Weight down 9 pounds since office visit on 04/15/2018 and symptoms have improved. Continue Lasix 40 mg twice daily with supplemental potassium.    COVID-19 Education: The signs and symptoms of COVID-19 were discussed with the patient and how to seek care for testing (follow up with PCP or arrange  E-visit).  The importance of social distancing was discussed today.  Time:   Today, I have spent 15 minutes with the patient with telehealth technology discussing the above problems.     Medication Adjustments/Labs and Tests Ordered: Current medicines are reviewed at length with the patient today.  Concerns regarding medicines are outlined above.   Tests Ordered: No orders of the defined types were placed in this encounter.   Medication Changes: No orders of the defined types were placed in this encounter.   Disposition:  Follow up in 1 year(s)  Signed, [name], MD  07/27/2018 11:25 AM    [facility]

## 2018-07-27 NOTE — Patient Instructions (Signed)

## 2018-08-23 ENCOUNTER — Telehealth: Payer: Self-pay | Admitting: Cardiovascular Disease

## 2018-08-23 NOTE — Telephone Encounter (Signed)
Returning someones call 

## 2018-08-23 NOTE — Telephone Encounter (Signed)
Patient thinks she forgot to erase phone message, as we did not call them

## 2018-10-22 ENCOUNTER — Inpatient Hospital Stay: Payer: Medicare Other | Attending: Internal Medicine

## 2018-10-22 ENCOUNTER — Other Ambulatory Visit: Payer: Self-pay

## 2018-10-22 ENCOUNTER — Ambulatory Visit (HOSPITAL_COMMUNITY)
Admission: RE | Admit: 2018-10-22 | Discharge: 2018-10-22 | Disposition: A | Discharge status: Home / Self Care | Payer: Medicare Other | Source: Ambulatory Visit | Attending: Internal Medicine | Admitting: Internal Medicine

## 2018-10-22 DIAGNOSIS — Z886 Allergy status to analgesic agent status: Secondary | ICD-10-CM | POA: Insufficient documentation

## 2018-10-22 DIAGNOSIS — I10 Essential (primary) hypertension: Secondary | ICD-10-CM | POA: Diagnosis not present

## 2018-10-22 DIAGNOSIS — C3411 Malignant neoplasm of upper lobe, right bronchus or lung: Secondary | ICD-10-CM | POA: Diagnosis not present

## 2018-10-22 DIAGNOSIS — C3490 Malignant neoplasm of unspecified part of unspecified bronchus or lung: Secondary | ICD-10-CM

## 2018-10-22 DIAGNOSIS — Z79899 Other long term (current) drug therapy: Secondary | ICD-10-CM | POA: Diagnosis not present

## 2018-10-22 DIAGNOSIS — I700 Atherosclerosis of aorta: Secondary | ICD-10-CM | POA: Diagnosis not present

## 2018-10-22 DIAGNOSIS — R5383 Other fatigue: Secondary | ICD-10-CM | POA: Insufficient documentation

## 2018-10-22 DIAGNOSIS — E7800 Pure hypercholesterolemia, unspecified: Secondary | ICD-10-CM | POA: Diagnosis not present

## 2018-10-22 DIAGNOSIS — J439 Emphysema, unspecified: Secondary | ICD-10-CM | POA: Diagnosis not present

## 2018-10-22 DIAGNOSIS — Z85038 Personal history of other malignant neoplasm of large intestine: Secondary | ICD-10-CM | POA: Diagnosis not present

## 2018-10-22 DIAGNOSIS — R918 Other nonspecific abnormal finding of lung field: Secondary | ICD-10-CM | POA: Diagnosis not present

## 2018-10-22 LAB — CMP (CANCER CENTER ONLY)
ALT: 6 U/L (ref 0–44)
AST: 15 U/L (ref 15–41)
Albumin: 3.6 g/dL (ref 3.5–5.0)
Alkaline Phosphatase: 123 U/L (ref 38–126)
Anion gap: 10 (ref 5–15)
BUN: 10 mg/dL (ref 8–23)
CO2: 30 mmol/L (ref 22–32)
Calcium: 8.3 mg/dL — ABNORMAL LOW (ref 8.9–10.3)
Chloride: 99 mmol/L (ref 98–111)
Creatinine: 1.12 mg/dL (ref 0.61–1.24)
GFR, Est AFR Am: >60 mL/min (ref >60)
GFR, Estimated: >60 mL/min (ref >60)
Glucose, Bld: 94 mg/dL (ref 70–99)
Potassium: 4.4 mmol/L (ref 3.5–5.1)
Sodium: 139 mmol/L (ref 135–145)
Total Bilirubin: 0.6 mg/dL (ref 0.3–1.2)
Total Protein: 6.7 g/dL (ref 6.5–8.1)

## 2018-10-22 LAB — CBC WITH DIFFERENTIAL (CANCER CENTER ONLY)
Abs Immature Granulocytes: 0.04 10*3/uL (ref 0.00–0.07)
Basophils Absolute: 0.1 10*3/uL (ref 0.0–0.1)
Basophils Relative: 1 %
Eosinophils Absolute: 0.3 10*3/uL (ref 0.0–0.5)
Eosinophils Relative: 6 %
HCT: 43.7 % (ref 39.0–52.0)
Hemoglobin: 13.7 g/dL (ref 13.0–17.0)
Immature Granulocytes: 1 %
Lymphocytes Relative: 29 %
Lymphs Abs: 1.7 10*3/uL (ref 0.7–4.0)
MCH: 28.3 pg (ref 26.0–34.0)
MCHC: 31.4 g/dL (ref 30.0–36.0)
MCV: 90.3 fL (ref 80.0–100.0)
Monocytes Absolute: 0.4 10*3/uL (ref 0.1–1.0)
Monocytes Relative: 8 %
Neutro Abs: 3.2 10*3/uL (ref 1.7–7.7)
Neutrophils Relative %: 55 %
Platelet Count: 141 10*3/uL — ABNORMAL LOW (ref 150–400)
RBC: 4.84 MIL/uL (ref 4.22–5.81)
RDW: 12.9 % (ref 11.5–15.5)
WBC Count: 5.7 10*3/uL (ref 4.0–10.5)
nRBC: 0 % (ref 0.0–0.2)

## 2018-10-22 MED ORDER — IOHEXOL 300 MG/ML  SOLN
75.0000 mL | Freq: Once | INTRAMUSCULAR | Status: AC | PRN
  Administered 2018-10-22: 75 mL via INTRAVENOUS

## 2018-10-22 MED ORDER — SODIUM CHLORIDE (PF) 0.9 % IJ SOLN
INTRAMUSCULAR | Status: AC
  Filled 2018-10-22: qty 50

## 2018-10-25 ENCOUNTER — Other Ambulatory Visit: Payer: Self-pay

## 2018-10-25 ENCOUNTER — Encounter: Payer: Self-pay | Admitting: Internal Medicine

## 2018-10-25 ENCOUNTER — Inpatient Hospital Stay (HOSPITAL_BASED_OUTPATIENT_CLINIC_OR_DEPARTMENT_OTHER): Payer: Medicare Other | Admitting: Internal Medicine

## 2018-10-25 VITALS — BP 137/78 | HR 77 | Temp 98.5°F | Resp 18 | Ht 73.0 in | Wt 162.4 lb

## 2018-10-25 DIAGNOSIS — R5383 Other fatigue: Secondary | ICD-10-CM | POA: Diagnosis not present

## 2018-10-25 DIAGNOSIS — I700 Atherosclerosis of aorta: Secondary | ICD-10-CM | POA: Diagnosis not present

## 2018-10-25 DIAGNOSIS — E7800 Pure hypercholesterolemia, unspecified: Secondary | ICD-10-CM | POA: Diagnosis not present

## 2018-10-25 DIAGNOSIS — C3411 Malignant neoplasm of upper lobe, right bronchus or lung: Secondary | ICD-10-CM

## 2018-10-25 DIAGNOSIS — I10 Essential (primary) hypertension: Secondary | ICD-10-CM

## 2018-10-25 DIAGNOSIS — C3490 Malignant neoplasm of unspecified part of unspecified bronchus or lung: Secondary | ICD-10-CM

## 2018-10-25 DIAGNOSIS — J439 Emphysema, unspecified: Secondary | ICD-10-CM | POA: Diagnosis not present

## 2018-10-25 NOTE — Progress Notes (Signed)
[facility] [phone]   [phone]) [phone] OFFICE PROGRESS NOTE  Adam Ward, [place] [address]  DIAGNOSIS: Unresectable he stage IIB (T3, N0, M0) non-small cell lung cancer, adenocarcinoma with positive EGFR amplification (G598V, G719A) diagnosed in March of 2015.  PRIOR THERAPY: Stereotactic radiotherapy to the right upper lobe pulmonary nodules under the care of Dr. Tammi Klippel completed on 07/26/2013.  CURRENT THERAPY: Observation.  INTERVAL HISTORY: Adam Ward 83 y.o. male returns to the clinic today for 6 months follow-up visit.  The patient is feeling fine today with no concerning complaints except for mild fatigue.  He denied having any chest pain, shortness of breath, cough or hemoptysis.  He has no fever or chills.  He denied having any weight loss or night sweats.  He has no nausea, vomiting, diarrhea or constipation.  He had repeat CT scan of the chest performed recently and is here today for evaluation and discussion of his scan results.  MEDICAL HISTORY: Past Medical History:  Diagnosis Date  . Acute respiratory failure with hypoxia ([place]) 07/26/2011  . Anxiety   . Bulla of lung ([place]) 07/28/2011  . Cavitary lesion of lung 07/28/2011   AFB smears x3 negative.  . Colon cancer ([place]) 1993  . COPD (chronic obstructive pulmonary disease) ([place])   . GERD (gastroesophageal reflux disease)    Hx: of  . Headache(784.0)   . High cholesterol   . Hypertension   . Lung cancer ([place])   . Numbness and tingling    Hx: of in right hand and B/LLE  . Pneumonia   . Rapid atrial fibrillation ([place]) 07/28/2011   Paroxysmal, newly diagnosed.  . Tobacco abuse 08/01/2011    ALLERGIES:  is allergic to celebrex [celecoxib] and tetracyclines & related.  MEDICATIONS:  Current Outpatient Medications  Medication Sig Dispense Refill  . alprazolam (XANAX) 2 MG tablet Take 2 mg by mouth 3 (three) times daily.    Marland Kitchen aspirin EC 81 MG tablet  Take 81 mg by mouth daily.    Marland Kitchen docusate sodium (COLACE) 100 MG capsule Take 100-200 mg by mouth daily as needed for mild constipation or moderate constipation.     . furosemide (LASIX) 40 MG tablet Take 1 tablet (40 mg total) by mouth 2 (two) times daily. 60 tablet 6  . levothyroxine (SYNTHROID, LEVOTHROID) 50 MCG tablet Take 50 mcg by mouth daily before breakfast.    . potassium chloride SA (K-DUR,KLOR-CON) 20 MEQ tablet Take 1 tablet (20 mEq total) by mouth daily. 30 tablet 6  . simvastatin (ZOCOR) 20 MG tablet Take 20 mg by mouth daily.     No current facility-administered medications for this visit.     SURGICAL HISTORY:  Past Surgical History:  Procedure Laterality Date  . APPENDECTOMY    . CERVICAL DISC SURGERY    . COLON SURGERY    . EYE SURGERY     as a child  . TONSILLECTOMY    . VIDEO BRONCHOSCOPY WITH ENDOBRONCHIAL NAVIGATION N/A 05/18/2013   Procedure: VIDEO BRONCHOSCOPY WITH ENDOBRONCHIAL NAVIGATION;  Surgeon: [name], MD;  Location: [facility] OR;  Service: Thoracic;  Laterality: N/A;    REVIEW OF SYSTEMS:  A comprehensive review of systems was negative except for: Constitutional: positive for fatigue   PHYSICAL EXAMINATION: General appearance: alert, cooperative, fatigued and no distress Head: Normocephalic, without obvious abnormality, atraumatic Neck: no adenopathy, no JVD, supple, symmetrical, trachea midline and thyroid not enlarged, symmetric, no tenderness/mass/nodules  Lymph nodes: Cervical, supraclavicular, and axillary nodes normal. Resp: clear to auscultation bilaterally Back: symmetric, no curvature. ROM normal. No CVA tenderness. Cardio: regular rate and rhythm, S1, S2 normal, no murmur, click, rub or gallop GI: soft, non-tender; bowel sounds normal; no masses,  no organomegaly Extremities: extremities normal, atraumatic, no cyanosis or edema  ECOG PERFORMANCE STATUS: 1 - Symptomatic but completely ambulatory  Blood pressure 137/78, pulse 77,  temperature 98.5 F (36.9 C), temperature source Oral, resp. rate 18, height '6\' 1"'  (1.854 m), weight 162 lb 6.4 oz (73.7 kg), SpO2 97 %.  LABORATORY DATA: Lab Results  Component Value Date   WBC 5.7 10/22/2018   HGB 13.7 10/22/2018   HCT 43.7 10/22/2018   MCV 90.3 10/22/2018   PLT 141 (L) 10/22/2018      Chemistry      Component Value Date/Time   NA 139 10/22/2018 1334   NA 142 09/30/2016 1244   K 4.4 10/22/2018 1334   K 4.2 09/30/2016 1244   CL 99 10/22/2018 1334   CO2 30 10/22/2018 1334   CO2 35 (H) 09/30/2016 1244   BUN 10 10/22/2018 1334   BUN 21.7 09/30/2016 1244   CREATININE 1.12 10/22/2018 1334   CREATININE 1.1 09/30/2016 1244      Component Value Date/Time   CALCIUM 8.3 (L) 10/22/2018 1334   CALCIUM 9.1 09/30/2016 1244   ALKPHOS 123 10/22/2018 1334   ALKPHOS 112 09/30/2016 1244   AST 15 10/22/2018 1334   AST 21 09/30/2016 1244   ALT 6 10/22/2018 1334   ALT 7 09/30/2016 1244   BILITOT 0.6 10/22/2018 1334   BILITOT 0.44 09/30/2016 1244       RADIOGRAPHIC STUDIES: Ct Chest W Contrast  Result Date: 10/22/2018 CLINICAL DATA:  3.7 cm EXAM: CT CHEST WITH CONTRAST TECHNIQUE: Multidetector CT imaging of the chest was performed during intravenous contrast administration. CONTRAST:  68m OMNIPAQUE IOHEXOL 300 MG/ML  SOLN COMPARISON:  04/21/2018 FINDINGS: Cardiovascular: The heart is normal in size. No pericardial effusion. No evidence of thoracic aortic aneurysm. Atherosclerotic calcifications of the aortic arch. Mild coronary atherosclerosis of the LAD. Mediastinum/Nodes: Small mediastinal lymph nodes which do not meet pathologic CT size criteria. Visualized thyroid is unremarkable. Lungs/Pleura: Radiation changes in the lateral right upper lobe. Dominant 3.7 cm ground-glass nodular opacity in the posterior right lower lobe (series 5/image 83), unchanged. 1.9 mm ground-glass nodular opacity in the anterior left upper lobe (series 5/image 59), grossly unchanged. Moderate  centrilobular and paraseptal emphysematous changes, upper lung predominant. No focal consolidation. No pleural effusion or pneumothorax. Upper Abdomen: Visualized upper abdomen is grossly unremarkable, noting vascular calcifications. Musculoskeletal: Cervical spine fixation hardware, incompletely visualized. IMPRESSION: Radiation changes in the right upper lobe. No evidence of recurrent or metastatic disease. Stable ground-glass nodules in the lungs bilaterally. In the setting of cancer surveillance, this is amenable to attention on follow-up. Aortic Atherosclerosis (ICD10-I70.0) and Emphysema (ICD10-J43.9). Electronically Signed   By: SJulian HyM.D.   On: 10/22/2018 16:29   ASSESSMENT AND PLAN:  This is a very pleasant 83years old white male with unresectable a stage IIb non-small cell lung cancer, adenocarcinoma with EGFR amplification and 2 separate lesions in the right upper lobe status post stereotactic radiotherapy. The patient is doing fine today with no concerning complaints. He has repeat CT scan of the chest performed recently.  I personally and independently reviewed the scans and discussed the results with the patient today. His scan showed no concerning findings for disease recurrence or  progression. I recommended for him to continue on observation with repeat CT scan of the chest in 1 year. He was advised to call immediately if he has any concerning symptoms in the interval. The patient voices understanding of current disease status and treatment options and is in agreement with the current care plan. All questions were answered. The patient knows to call the clinic with any problems, questions or concerns. We can certainly see the patient much sooner if necessary. I spent 10 minutes counseling the patient face to face. The total time spent in the appointment was 15 minutes.  Disclaimer: This note was dictated with voice recognition software. Similar sounding words can  inadvertently be transcribed and may not be corrected upon review.

## 2018-10-26 ENCOUNTER — Telehealth: Payer: Self-pay | Admitting: Internal Medicine

## 2018-10-26 NOTE — Telephone Encounter (Signed)
Scheduled appt per 8/24 los - 1 yr f/u - mailed letter with appt date and time  [facility] to contact patient with scan appt .

## 2018-11-04 DIAGNOSIS — F419 Anxiety disorder, unspecified: Secondary | ICD-10-CM | POA: Diagnosis not present

## 2018-11-04 DIAGNOSIS — Z6822 Body mass index (BMI) 22.0-22.9, adult: Secondary | ICD-10-CM | POA: Diagnosis not present

## 2018-12-01 DIAGNOSIS — I4891 Unspecified atrial fibrillation: Secondary | ICD-10-CM | POA: Diagnosis not present

## 2018-12-01 DIAGNOSIS — E782 Mixed hyperlipidemia: Secondary | ICD-10-CM | POA: Diagnosis not present

## 2018-12-01 DIAGNOSIS — F419 Anxiety disorder, unspecified: Secondary | ICD-10-CM | POA: Diagnosis not present

## 2018-12-09 DIAGNOSIS — Z961 Presence of intraocular lens: Secondary | ICD-10-CM | POA: Diagnosis not present

## 2018-12-09 DIAGNOSIS — H524 Presbyopia: Secondary | ICD-10-CM | POA: Diagnosis not present

## 2018-12-09 DIAGNOSIS — H52203 Unspecified astigmatism, bilateral: Secondary | ICD-10-CM | POA: Diagnosis not present

## 2018-12-09 DIAGNOSIS — H5213 Myopia, bilateral: Secondary | ICD-10-CM | POA: Diagnosis not present

## 2018-12-21 DIAGNOSIS — E785 Hyperlipidemia, unspecified: Secondary | ICD-10-CM | POA: Diagnosis not present

## 2018-12-21 DIAGNOSIS — E039 Hypothyroidism, unspecified: Secondary | ICD-10-CM | POA: Diagnosis not present

## 2018-12-21 DIAGNOSIS — I4891 Unspecified atrial fibrillation: Secondary | ICD-10-CM | POA: Diagnosis not present

## 2018-12-21 DIAGNOSIS — C3490 Malignant neoplasm of unspecified part of unspecified bronchus or lung: Secondary | ICD-10-CM | POA: Diagnosis not present

## 2018-12-21 DIAGNOSIS — I10 Essential (primary) hypertension: Secondary | ICD-10-CM | POA: Diagnosis not present

## 2018-12-21 DIAGNOSIS — Z1389 Encounter for screening for other disorder: Secondary | ICD-10-CM | POA: Diagnosis not present

## 2018-12-21 DIAGNOSIS — Z23 Encounter for immunization: Secondary | ICD-10-CM | POA: Diagnosis not present

## 2018-12-21 DIAGNOSIS — Z0000 Encounter for general adult medical examination without abnormal findings: Secondary | ICD-10-CM | POA: Diagnosis not present

## 2018-12-21 DIAGNOSIS — Z6821 Body mass index (BMI) 21.0-21.9, adult: Secondary | ICD-10-CM | POA: Diagnosis not present

## 2018-12-21 DIAGNOSIS — J449 Chronic obstructive pulmonary disease, unspecified: Secondary | ICD-10-CM | POA: Diagnosis not present

## 2018-12-21 DIAGNOSIS — R7309 Other abnormal glucose: Secondary | ICD-10-CM | POA: Diagnosis not present

## 2019-02-03 DIAGNOSIS — F419 Anxiety disorder, unspecified: Secondary | ICD-10-CM | POA: Diagnosis not present

## 2019-03-03 DIAGNOSIS — E039 Hypothyroidism, unspecified: Secondary | ICD-10-CM | POA: Diagnosis not present

## 2019-03-03 DIAGNOSIS — J449 Chronic obstructive pulmonary disease, unspecified: Secondary | ICD-10-CM | POA: Diagnosis not present

## 2019-03-03 DIAGNOSIS — E785 Hyperlipidemia, unspecified: Secondary | ICD-10-CM | POA: Diagnosis not present

## 2019-03-03 DIAGNOSIS — I480 Paroxysmal atrial fibrillation: Secondary | ICD-10-CM | POA: Diagnosis not present

## 2019-04-26 ENCOUNTER — Other Ambulatory Visit: Payer: Self-pay | Admitting: Cardiovascular Disease

## 2019-04-27 DIAGNOSIS — Z23 Encounter for immunization: Secondary | ICD-10-CM | POA: Diagnosis not present

## 2019-05-06 DIAGNOSIS — F419 Anxiety disorder, unspecified: Secondary | ICD-10-CM | POA: Diagnosis not present

## 2019-05-06 DIAGNOSIS — Z6821 Body mass index (BMI) 21.0-21.9, adult: Secondary | ICD-10-CM | POA: Diagnosis not present

## 2019-05-25 DIAGNOSIS — Z23 Encounter for immunization: Secondary | ICD-10-CM | POA: Diagnosis not present

## 2019-07-01 DIAGNOSIS — I480 Paroxysmal atrial fibrillation: Secondary | ICD-10-CM | POA: Diagnosis not present

## 2019-07-01 DIAGNOSIS — J449 Chronic obstructive pulmonary disease, unspecified: Secondary | ICD-10-CM | POA: Diagnosis not present

## 2019-07-01 DIAGNOSIS — Z79891 Long term (current) use of opiate analgesic: Secondary | ICD-10-CM | POA: Diagnosis not present

## 2019-07-01 DIAGNOSIS — E039 Hypothyroidism, unspecified: Secondary | ICD-10-CM | POA: Diagnosis not present

## 2019-07-19 ENCOUNTER — Telehealth: Payer: Self-pay | Admitting: Cardiovascular Disease

## 2019-07-19 NOTE — Telephone Encounter (Signed)
  Patient Consent for Virtual Visit         Marten Iles Messenger has provided verbal consent on 07/19/2019 for a virtual visit (video or telephone).   CONSENT FOR VIRTUAL VISIT FOR:  Adam Ward  By participating in this virtual visit I agree to the following:  I hereby voluntarily request, consent and authorize [facility] and its employed or contracted physicians, physician assistants, nurse practitioners or other licensed health care professionals (the Practitioner), to provide me with telemedicine health care services (the "Services") as deemed necessary by the treating Practitioner. I acknowledge and consent to receive the Services by the Practitioner via telemedicine. I understand that the telemedicine visit will involve communicating with the Practitioner through live audiovisual communication technology and the disclosure of certain medical information by electronic transmission. I acknowledge that I have been given the opportunity to request an in-person assessment or other available alternative prior to the telemedicine visit and am voluntarily participating in the telemedicine visit.  I understand that I have the right to withhold or withdraw my consent to the use of telemedicine in the course of my care at any time, without affecting my right to future care or treatment, and that the Practitioner or I may terminate the telemedicine visit at any time. I understand that I have the right to inspect all information obtained and/or recorded in the course of the telemedicine visit and may receive copies of available information for a reasonable fee.  I understand that some of the potential risks of receiving the Services via telemedicine include:  Marland Kitchen Delay or interruption in medical evaluation due to technological equipment failure or disruption; . Information transmitted may not be sufficient (e.g. poor resolution of images) to allow for appropriate medical decision making by the Practitioner;  and/or  . In rare instances, security protocols could fail, causing a breach of personal health information.  Furthermore, I acknowledge that it is my responsibility to provide information about my medical history, conditions and care that is complete and accurate to the best of my ability. I acknowledge that Practitioner's advice, recommendations, and/or decision may be based on factors not within their control, such as incomplete or inaccurate data provided by me or distortions of diagnostic images or specimens that may result from electronic transmissions. I understand that the practice of medicine is not an exact science and that Practitioner makes no warranties or guarantees regarding treatment outcomes. I acknowledge that a copy of this consent can be made available to me via my patient portal ([facility]), or I can request a printed copy by calling the office of [facility].    I understand that my insurance will be billed for this visit.   I have read or had this consent read to me. . I understand the contents of this consent, which adequately explains the benefits and risks of the Services being provided via telemedicine.  . I have been provided ample opportunity to ask questions regarding this consent and the Services and have had my questions answered to my satisfaction. . I give my informed consent for the services to be provided through the use of telemedicine in my medical care

## 2019-07-20 ENCOUNTER — Telehealth (INDEPENDENT_AMBULATORY_CARE_PROVIDER_SITE_OTHER): Payer: Medicare Other | Admitting: Cardiovascular Disease

## 2019-07-20 ENCOUNTER — Encounter: Payer: Self-pay | Admitting: Cardiovascular Disease

## 2019-07-20 VITALS — BP 163/93 | HR 81 | Ht 73.0 in | Wt 154.0 lb

## 2019-07-20 DIAGNOSIS — R600 Localized edema: Secondary | ICD-10-CM | POA: Diagnosis not present

## 2019-07-20 DIAGNOSIS — I10 Essential (primary) hypertension: Secondary | ICD-10-CM | POA: Diagnosis not present

## 2019-07-20 DIAGNOSIS — E7800 Pure hypercholesterolemia, unspecified: Secondary | ICD-10-CM

## 2019-07-20 DIAGNOSIS — I480 Paroxysmal atrial fibrillation: Secondary | ICD-10-CM

## 2019-07-20 NOTE — Patient Instructions (Signed)
Medication Instructions:  Your physician recommends that you continue on your current medications as directed. Please refer to the Current Medication list given to you today.  *If you need a refill on your cardiac medications before your next appointment, please call your pharmacy*   Lab Work: None today If you have labs (blood work) drawn today and your tests are completely normal, you will receive your results only by: Marland Kitchen MyChart Message (if you have MyChart) OR . A paper copy in the mail If you have any lab test that is abnormal or we need to change your treatment, we will call you to review the results.   Testing/Procedures: None today   Follow-Up: At [facility], you and your health needs are our priority.  As part of our continuing mission to provide you with exceptional heart care, we have created designated Provider Care Teams.  These Care Teams include your primary Cardiologist (physician) and Advanced Practice Providers (APPs -  Physician Assistants and Nurse Practitioners) who all work together to provide you with the care you need, when you need it.  We recommend signing up for the patient portal called "MyChart".  Sign up information is provided on this After Visit Summary.  MyChart is used to connect with patients for Virtual Visits (Telemedicine).  Patients are able to view lab/test results, encounter notes, upcoming appointments, etc.  Non-urgent messages can be sent to your provider as well.   To learn more about what you can do with MyChart, go to [email or web address].    Your next appointment:  As needed with Dr.Koneswaran     Thank you for choosing [facility] !

## 2019-07-20 NOTE — Progress Notes (Signed)
Virtual Visit via Telephone Note   This visit type was conducted due to national recommendations for restrictions regarding the COVID-19 Pandemic (e.g. social distancing) in an effort to limit this patient's exposure and mitigate transmission in our community.  Due to his co-morbid illnesses, this patient is at least at moderate risk for complications without adequate follow up.  This format is felt to be most appropriate for this patient at this time.  The patient did not have access to video technology/had technical difficulties with video requiring transitioning to audio format only (telephone).  All issues noted in this document were discussed and addressed.  No physical exam could be performed with this format.  Please refer to the patient's chart for his  consent to telehealth for [facility].   Date:  07/20/2019   ID:  Adam Ward Web, DOB [birth year], MRN [id number]  Patient Location: Home Provider Location: Office  PCP:  [name], MD  Cardiologist:  [name], MD  Electrophysiologist:  None   Evaluation Performed:  Follow-Up Visit  Chief Complaint:  PAF  History of Present Illness:    Adam Ward is a 85 y.o. male with a history of paroxysmal atrial fibrillation, lung cancer, hypertension, chronic lower extremity edema, and hyperlipidemia.  The patient denies any symptoms of chest pain, palpitations, shortness of breath, lightheadedness, dizziness, leg swelling, orthopnea, PND, and syncope.  He has no complaints. He said "I feel like I did when I was a kid" with respect to good health.   Past Medical History:  Diagnosis Date  . Acute respiratory failure with hypoxia (Adam Ward) 07/26/2011  . Anxiety   . Bulla of lung (Adam Ward) 07/28/2011  . Cavitary lesion of lung 07/28/2011   AFB smears x3 negative.  . Colon cancer (Adam Ward) 1993  . COPD (chronic obstructive pulmonary disease) (Adam Ward)   . GERD (gastroesophageal reflux disease)    Hx: of  . Headache(784.0)   . High  cholesterol   . Hypertension   . Lung cancer (Adam Ward)   . Numbness and tingling    Hx: of in right hand and B/LLE  . Pneumonia   . Rapid atrial fibrillation (Adam Ward) 07/28/2011   Paroxysmal, newly diagnosed.  . Tobacco abuse 08/01/2011   Past Surgical History:  Procedure Laterality Date  . APPENDECTOMY    . CERVICAL DISC SURGERY    . COLON SURGERY    . EYE SURGERY     as a child  . TONSILLECTOMY    . VIDEO BRONCHOSCOPY WITH ENDOBRONCHIAL NAVIGATION N/A 05/18/2013   Procedure: VIDEO BRONCHOSCOPY WITH ENDOBRONCHIAL NAVIGATION;  Surgeon: [name], MD;  Location: [facility];  Service: Thoracic;  Laterality: N/A;     Current Meds  Medication Sig  . alprazolam (XANAX) 2 MG tablet Take 2 mg by mouth 3 (three) times daily.  Marland Kitchen aspirin EC 81 MG tablet Take 81 mg by mouth daily.  Marland Kitchen docusate sodium (COLACE) 100 MG capsule Take 100-200 mg by mouth daily as needed for mild constipation or moderate constipation.   . furosemide (LASIX) 40 MG tablet TAKE (1) TABLET BY MOUTH TWICE DAILY.  Marland Kitchen levothyroxine (SYNTHROID, LEVOTHROID) 50 MCG tablet Take 50 mcg by mouth daily before breakfast.  . potassium chloride SA (K-DUR,KLOR-CON) 20 MEQ tablet Take 1 tablet (20 mEq total) by mouth daily.  . simvastatin (ZOCOR) 20 MG tablet Take 20 mg by mouth daily.     Allergies:   Celebrex [celecoxib] and Tetracyclines & related   Social History   Tobacco  Use  . Smoking status: Former Smoker    Packs/day: 1.00    Years: 50.00    Pack years: 50.00    Types: Cigarettes    Quit date: 07/16/2011    Years since quitting: 8.0  . Smokeless tobacco: Never Used  . Tobacco comment: started using electric cigarettes in 06-2010  Substance Use Topics  . Alcohol use: No  . Drug use: No     Family Hx: The patient's family history includes Heart disease in his brother and mother; Pneumonia in his sister; Stroke in his father.  ROS:   Please see the history of present illness.     All other systems reviewed and are  negative.   Prior CV studies:   The following studies were reviewed today:  Echocardiogram 04/28/2018:  1. The left ventricle has hyperdynamic systolic function, with an ejection fraction of >65%. The cavity size was normal. Left ventricular diastolic Doppler parameters are indeterminate.  2. The right ventricle has normal systolic function. The cavity was normal. There is no increase in right ventricular wall thickness.  3. The aortic valve was not well visualized Mild thickening of the aortic valve Mild calcification of the aortic valve. Aortic valve regurgitation is mild by color flow Doppler. no stenosis of the aortic valve. Moderate aortic annular calcification  noted.  4. The mitral valve is normal in structure. Mild thickening of the mitral valve leaflet. Mild calcification of the mitral valve leaflet. There is moderate mitral annular calcification present. No evidence of mitral valve stenosis.  5. The aortic root is normal in size and structure.  6. Pulmonary hypertension is indeterminant, inadequate TR.  7. The interatrial septum was not well visualized.  Labs/Other Tests and Data Reviewed:    EKG:  No ECG reviewed.  Recent Labs: 10/22/2018: ALT 6; BUN 10; Creatinine 1.12; Hemoglobin 13.7; Platelet Count 141; Potassium 4.4; Sodium 139   Recent Lipid Panel Lab Results  Component Value Date/Time   CHOL 160 07/06/2012 12:20 PM   TRIG 97 07/06/2012 12:20 PM   HDL 48 07/06/2012 12:20 PM   CHOLHDL 3.3 07/06/2012 12:20 PM   LDLCALC 93 07/06/2012 12:20 PM    Wt Readings from Last 3 Encounters:  07/20/19 154 lb (69.9 kg)  10/25/18 162 lb 6.4 oz (73.7 kg)  07/27/18 160 lb 3.2 oz (72.7 kg)     Objective:    Vital Signs:  BP (!) 163/93   Pulse 81   Ht 6\' 1"  (1.854 m)   Wt 154 lb (69.9 kg)   BMI 20.32 kg/m    VITAL SIGNS:  reviewed  ASSESSMENT & PLAN:    1. Paroxysmal atrial fibrillation: Symptomatically stable and without recurrence. He previously deferred  anticoagulation. He is currently on aspirin 81 mg. He had previously been on metoprolol but is no longer taking this as his PCP previously stopped it and he is uncertain why.  2. Hypertension: BP is elevated.  This will need further monitoring.  3. Hyperlipidemia: Continue simvastatin.  4.  Bilateral leg edema: Likely related to chronic venous insufficiency.  Denies swelling today. Weight down 6 pounds since last evaluation in May 2020. Continue Lasix 40 mg twice daily with supplemental potassium.    COVID-19 Education: The signs and symptoms of COVID-19 were discussed with the patient and how to seek care for testing (follow up with PCP or arrange E-visit).  The importance of social distancing was discussed today.  Time:   Today, I have spent 15 minutes with the patient  with telehealth technology discussing the above problems.     Medication Adjustments/Labs and Tests Ordered: Current medicines are reviewed at length with the patient today.  Concerns regarding medicines are outlined above.   Tests Ordered: No orders of the defined types were placed in this encounter.   Medication Changes: No orders of the defined types were placed in this encounter.   Disposition:  Follow up as needed  Signed, [name], MD  07/20/2019 1:12 PM    [facility]

## 2019-07-27 ENCOUNTER — Telehealth: Payer: Medicare Other | Admitting: Cardiovascular Disease

## 2019-08-31 DIAGNOSIS — Z79891 Long term (current) use of opiate analgesic: Secondary | ICD-10-CM | POA: Diagnosis not present

## 2019-08-31 DIAGNOSIS — J449 Chronic obstructive pulmonary disease, unspecified: Secondary | ICD-10-CM | POA: Diagnosis not present

## 2019-08-31 DIAGNOSIS — I480 Paroxysmal atrial fibrillation: Secondary | ICD-10-CM | POA: Diagnosis not present

## 2019-08-31 DIAGNOSIS — E039 Hypothyroidism, unspecified: Secondary | ICD-10-CM | POA: Diagnosis not present

## 2019-09-30 DIAGNOSIS — E039 Hypothyroidism, unspecified: Secondary | ICD-10-CM | POA: Diagnosis not present

## 2019-09-30 DIAGNOSIS — J449 Chronic obstructive pulmonary disease, unspecified: Secondary | ICD-10-CM | POA: Diagnosis not present

## 2019-09-30 DIAGNOSIS — Z79891 Long term (current) use of opiate analgesic: Secondary | ICD-10-CM | POA: Diagnosis not present

## 2019-09-30 DIAGNOSIS — I480 Paroxysmal atrial fibrillation: Secondary | ICD-10-CM | POA: Diagnosis not present

## 2019-10-24 ENCOUNTER — Inpatient Hospital Stay: Payer: Medicare Other

## 2019-10-25 ENCOUNTER — Other Ambulatory Visit: Payer: Self-pay

## 2019-10-25 ENCOUNTER — Inpatient Hospital Stay: Payer: Medicare Other | Attending: Internal Medicine

## 2019-10-25 ENCOUNTER — Ambulatory Visit (HOSPITAL_COMMUNITY)
Admission: RE | Admit: 2019-10-25 | Discharge: 2019-10-25 | Disposition: A | Discharge status: Home / Self Care | Payer: Medicare Other | Source: Ambulatory Visit | Attending: Internal Medicine | Admitting: Internal Medicine

## 2019-10-25 ENCOUNTER — Encounter (HOSPITAL_COMMUNITY): Payer: Self-pay

## 2019-10-25 DIAGNOSIS — C3490 Malignant neoplasm of unspecified part of unspecified bronchus or lung: Secondary | ICD-10-CM | POA: Diagnosis not present

## 2019-10-25 DIAGNOSIS — I700 Atherosclerosis of aorta: Secondary | ICD-10-CM | POA: Insufficient documentation

## 2019-10-25 DIAGNOSIS — E7800 Pure hypercholesterolemia, unspecified: Secondary | ICD-10-CM | POA: Insufficient documentation

## 2019-10-25 DIAGNOSIS — K449 Diaphragmatic hernia without obstruction or gangrene: Secondary | ICD-10-CM | POA: Diagnosis not present

## 2019-10-25 DIAGNOSIS — R0602 Shortness of breath: Secondary | ICD-10-CM | POA: Insufficient documentation

## 2019-10-25 DIAGNOSIS — Z9049 Acquired absence of other specified parts of digestive tract: Secondary | ICD-10-CM | POA: Insufficient documentation

## 2019-10-25 DIAGNOSIS — I513 Intracardiac thrombosis, not elsewhere classified: Secondary | ICD-10-CM | POA: Diagnosis not present

## 2019-10-25 DIAGNOSIS — I2510 Atherosclerotic heart disease of native coronary artery without angina pectoris: Secondary | ICD-10-CM | POA: Diagnosis not present

## 2019-10-25 DIAGNOSIS — I10 Essential (primary) hypertension: Secondary | ICD-10-CM | POA: Insufficient documentation

## 2019-10-25 DIAGNOSIS — Z886 Allergy status to analgesic agent status: Secondary | ICD-10-CM | POA: Insufficient documentation

## 2019-10-25 DIAGNOSIS — Z85038 Personal history of other malignant neoplasm of large intestine: Secondary | ICD-10-CM | POA: Diagnosis not present

## 2019-10-25 DIAGNOSIS — Z79899 Other long term (current) drug therapy: Secondary | ICD-10-CM | POA: Diagnosis not present

## 2019-10-25 DIAGNOSIS — J439 Emphysema, unspecified: Secondary | ICD-10-CM | POA: Diagnosis not present

## 2019-10-25 DIAGNOSIS — S2231XA Fracture of one rib, right side, initial encounter for closed fracture: Secondary | ICD-10-CM | POA: Diagnosis not present

## 2019-10-25 DIAGNOSIS — C3411 Malignant neoplasm of upper lobe, right bronchus or lung: Secondary | ICD-10-CM | POA: Insufficient documentation

## 2019-10-25 DIAGNOSIS — J8410 Pulmonary fibrosis, unspecified: Secondary | ICD-10-CM | POA: Diagnosis not present

## 2019-10-25 LAB — CMP (CANCER CENTER ONLY)
ALT: <6 U/L (ref 0–44)
AST: 13 U/L — ABNORMAL LOW (ref 15–41)
Albumin: 3.3 g/dL — ABNORMAL LOW (ref 3.5–5.0)
Alkaline Phosphatase: 107 U/L (ref 38–126)
Anion gap: 6 (ref 5–15)
BUN: 15 mg/dL (ref 8–23)
CO2: 34 mmol/L — ABNORMAL HIGH (ref 22–32)
Calcium: 8.9 mg/dL (ref 8.9–10.3)
Chloride: 101 mmol/L (ref 98–111)
Creatinine: 1.27 mg/dL — ABNORMAL HIGH (ref 0.61–1.24)
GFR, Est AFR Am: >60 mL/min (ref >60)
GFR, Estimated: 53 mL/min — ABNORMAL LOW (ref >60)
Glucose, Bld: 103 mg/dL — ABNORMAL HIGH (ref 70–99)
Potassium: 4.3 mmol/L (ref 3.5–5.1)
Sodium: 141 mmol/L (ref 135–145)
Total Bilirubin: 0.7 mg/dL (ref 0.3–1.2)
Total Protein: 6.5 g/dL (ref 6.5–8.1)

## 2019-10-25 LAB — CBC WITH DIFFERENTIAL (CANCER CENTER ONLY)
Abs Immature Granulocytes: 0.02 10*3/uL (ref 0.00–0.07)
Basophils Absolute: 0 10*3/uL (ref 0.0–0.1)
Basophils Relative: 0 %
Eosinophils Absolute: 0.2 10*3/uL (ref 0.0–0.5)
Eosinophils Relative: 3 %
HCT: 44 % (ref 39.0–52.0)
Hemoglobin: 13.7 g/dL (ref 13.0–17.0)
Immature Granulocytes: 0 %
Lymphocytes Relative: 28 %
Lymphs Abs: 1.9 10*3/uL (ref 0.7–4.0)
MCH: 28.8 pg (ref 26.0–34.0)
MCHC: 31.1 g/dL (ref 30.0–36.0)
MCV: 92.6 fL (ref 80.0–100.0)
Monocytes Absolute: 0.5 10*3/uL (ref 0.1–1.0)
Monocytes Relative: 8 %
Neutro Abs: 4.1 10*3/uL (ref 1.7–7.7)
Neutrophils Relative %: 61 %
Platelet Count: 127 10*3/uL — ABNORMAL LOW (ref 150–400)
RBC: 4.75 MIL/uL (ref 4.22–5.81)
RDW: 13.5 % (ref 11.5–15.5)
WBC Count: 6.7 10*3/uL (ref 4.0–10.5)
nRBC: 0 % (ref 0.0–0.2)

## 2019-10-25 MED ORDER — SODIUM CHLORIDE (PF) 0.9 % IJ SOLN
INTRAMUSCULAR | Status: DC
Start: 2019-10-25 — End: 2019-10-26
  Filled 2019-10-25: qty 50

## 2019-10-25 MED ORDER — IOHEXOL 300 MG/ML  SOLN
75.0000 mL | Freq: Once | INTRAMUSCULAR | Status: AC | PRN
  Administered 2019-10-25: 75 mL via INTRAVENOUS

## 2019-10-26 ENCOUNTER — Telehealth: Payer: Self-pay | Admitting: Internal Medicine

## 2019-10-26 ENCOUNTER — Other Ambulatory Visit: Payer: Self-pay

## 2019-10-26 ENCOUNTER — Inpatient Hospital Stay (HOSPITAL_BASED_OUTPATIENT_CLINIC_OR_DEPARTMENT_OTHER): Payer: Medicare Other | Admitting: Internal Medicine

## 2019-10-26 ENCOUNTER — Encounter: Payer: Self-pay | Admitting: Internal Medicine

## 2019-10-26 DIAGNOSIS — C3411 Malignant neoplasm of upper lobe, right bronchus or lung: Secondary | ICD-10-CM | POA: Diagnosis not present

## 2019-10-26 DIAGNOSIS — C3490 Malignant neoplasm of unspecified part of unspecified bronchus or lung: Secondary | ICD-10-CM | POA: Diagnosis not present

## 2019-10-26 DIAGNOSIS — E7800 Pure hypercholesterolemia, unspecified: Secondary | ICD-10-CM | POA: Diagnosis not present

## 2019-10-26 DIAGNOSIS — R0602 Shortness of breath: Secondary | ICD-10-CM | POA: Diagnosis not present

## 2019-10-26 DIAGNOSIS — I2510 Atherosclerotic heart disease of native coronary artery without angina pectoris: Secondary | ICD-10-CM | POA: Diagnosis not present

## 2019-10-26 DIAGNOSIS — I513 Intracardiac thrombosis, not elsewhere classified: Secondary | ICD-10-CM | POA: Diagnosis not present

## 2019-10-26 DIAGNOSIS — I10 Essential (primary) hypertension: Secondary | ICD-10-CM | POA: Diagnosis not present

## 2019-10-26 NOTE — Progress Notes (Signed)
[facility] [phone]   Fax:([phone] OFFICE PROGRESS NOTE  [name], [place] [address]  DIAGNOSIS: Unresectable he stage IIB (T3, N0, M0) non-small cell lung cancer, adenocarcinoma with positive EGFR amplification (G598V, G719A) diagnosed in March of 2015.  PRIOR THERAPY: Stereotactic radiotherapy to the right upper lobe pulmonary nodules under the care of Dr. Tammi Klippel completed on 07/26/2013.  CURRENT THERAPY: Observation.  INTERVAL HISTORY: Adam Ward 84 y.o. male returns to the clinic today for follow-up visit accompanied by his wife.  The patient is feeling fine today with no concerning complaints.  He denied having any chest pain but has mild shortness of breath with exertion with no cough or hemoptysis.  He denied having any fever or chills.  He has no nausea, vomiting, diarrhea or constipation.  He denied having any headache or visual changes.  He had repeat CT scan of the chest performed recently and he is here for evaluation and discussion of his discuss results.  MEDICAL HISTORY: Past Medical History:  Diagnosis Date  . Acute respiratory failure with hypoxia ([facility]) 07/26/2011  . Anxiety   . Bulla of lung ([facility]) 07/28/2011  . Cavitary lesion of lung 07/28/2011   AFB smears x3 negative.  . Colon cancer ([facility]) 1993  . COPD (chronic obstructive pulmonary disease) ([facility])   . GERD (gastroesophageal reflux disease)    Hx: of  . Headache(784.0)   . High cholesterol   . Hypertension   . Lung cancer ([facility])   . Numbness and tingling    Hx: of in right hand and B/LLE  . Pneumonia   . Rapid atrial fibrillation ([facility]) 07/28/2011   Paroxysmal, newly diagnosed.  . Tobacco abuse 08/01/2011    ALLERGIES:  is allergic to celebrex [celecoxib] and tetracyclines & related.  MEDICATIONS:  Current Outpatient Medications  Medication Sig Dispense Refill  . alprazolam (XANAX) 2 MG tablet Take 2 mg by mouth 3 (three) times  daily.    Marland Kitchen aspirin EC 81 MG tablet Take 81 mg by mouth daily.    Marland Kitchen docusate sodium (COLACE) 100 MG capsule Take 100-200 mg by mouth daily as needed for mild constipation or moderate constipation.     . furosemide (LASIX) 40 MG tablet TAKE (1) TABLET BY MOUTH TWICE DAILY. 60 tablet 6  . levothyroxine (SYNTHROID, LEVOTHROID) 50 MCG tablet Take 50 mcg by mouth daily before breakfast.    . potassium chloride SA (K-DUR,KLOR-CON) 20 MEQ tablet Take 1 tablet (20 mEq total) by mouth daily. 30 tablet 6  . simvastatin (ZOCOR) 20 MG tablet Take 20 mg by mouth daily.     No current facility-administered medications for this visit.    SURGICAL HISTORY:  Past Surgical History:  Procedure Laterality Date  . APPENDECTOMY    . CERVICAL DISC SURGERY    . COLON SURGERY    . EYE SURGERY     as a child  . TONSILLECTOMY    . VIDEO BRONCHOSCOPY WITH ENDOBRONCHIAL NAVIGATION N/A 05/18/2013   Procedure: VIDEO BRONCHOSCOPY WITH ENDOBRONCHIAL NAVIGATION;  Surgeon: [name], MD;  Location: [facility];  Service: Thoracic;  Laterality: N/A;    REVIEW OF SYSTEMS:  A comprehensive review of systems was negative except for: Constitutional: positive for fatigue Respiratory: positive for dyspnea on exertion   PHYSICAL EXAMINATION: General appearance: alert, cooperative, fatigued and no distress Head: Normocephalic, without obvious abnormality, atraumatic Neck: no adenopathy, no JVD, supple, symmetrical, trachea midline and thyroid  not enlarged, symmetric, no tenderness/mass/nodules Lymph nodes: Cervical, supraclavicular, and axillary nodes normal. Resp: clear to auscultation bilaterally Back: symmetric, no curvature. ROM normal. No CVA tenderness. Cardio: regular rate and rhythm, S1, S2 normal, no murmur, click, rub or gallop GI: soft, non-tender; bowel sounds normal; no masses,  no organomegaly Extremities: extremities normal, atraumatic, no cyanosis or edema  ECOG PERFORMANCE STATUS: 1 - Symptomatic but  completely ambulatory  Blood pressure 134/69, pulse 79, temperature (!) 97.5 F (36.4 C), temperature source Tympanic, resp. rate 18, height _0  (1.854 m), weight 152 lb 11.2 oz (69.3 kg), SpO2 97 %.  LABORATORY DATA: Lab Results  Component Value Date   WBC 6.7 10/25/2019   HGB 13.7 10/25/2019   HCT 44.0 10/25/2019   MCV 92.6 10/25/2019   PLT 127 (L) 10/25/2019      Chemistry      Component Value Date/Time   NA 141 10/25/2019 1241   NA 142 09/30/2016 1244   K 4.3 10/25/2019 1241   K 4.2 09/30/2016 1244   CL 101 10/25/2019 1241   CO2 34 (H) 10/25/2019 1241   CO2 35 (H) 09/30/2016 1244   BUN 15 10/25/2019 1241   BUN 21.7 09/30/2016 1244   CREATININE 1.27 (H) 10/25/2019 1241   CREATININE 1.1 09/30/2016 1244      Component Value Date/Time   CALCIUM 8.9 10/25/2019 1241   CALCIUM 9.1 09/30/2016 1244   ALKPHOS 107 10/25/2019 1241   ALKPHOS 112 09/30/2016 1244   AST 13 (L) 10/25/2019 1241   AST 21 09/30/2016 1244   ALT <6 10/25/2019 1241   ALT 7 09/30/2016 1244   BILITOT 0.7 10/25/2019 1241   BILITOT 0.44 09/30/2016 1244       RADIOGRAPHIC STUDIES: CT Chest W Contrast  Result Date: 10/25/2019 CLINICAL DATA:  Non-small cell lung cancer staging EXAM: CT CHEST WITH CONTRAST TECHNIQUE: Multidetector CT imaging of the chest was performed during intravenous contrast administration. CONTRAST:  75m OMNIPAQUE IOHEXOL 300 MG/ML  SOLN COMPARISON:  10/22/2018, 04/21/2018 FINDINGS: Cardiovascular: Severe mixed aortic atherosclerosis with extensive irregular mural thrombus of the descending thoracic aorta. Normal heart size. Left coronary artery calcifications. No pericardial effusion. Mediastinum/Nodes: No enlarged mediastinal, hilar, or axillary lymph nodes. Small hiatal hernia. Thyroid gland, trachea, and esophagus demonstrate no significant findings. Lungs/Pleura: Moderate centrilobular emphysema. Slight interval increase in bandlike fibrotic consolidation of the peripheral right  upper lobe (series 7, image 45). Unchanged ill-defined ground-glass opacity of the dependent right lower lobe, measuring approximately 3.0 x 2.7 cm (series 7, image 74). No pleural effusion or pneumothorax. Upper Abdomen: No acute abnormality. Musculoskeletal: No chest wall mass or suspicious bone lesions identified. Nonacute fracture of the lateral right fourth rib (series 7, image 51). IMPRESSION: 1. Slight interval increase in bandlike fibrotic consolidation of the peripheral right upper lobe, in keeping with expected evolution of radiation fibrosis. No evidence of recurrent malignancy. 2. Unchanged ill-defined ground-glass opacity of the dependent right lower lobe, measuring approximately 3.0 x 2.7 cm and generally nonspecific, indolent adenocarcinoma is difficult to strictly exclude. Attention on follow-up. 3. Emphysema (ICD10-J43.9). 4. Coronary artery disease. 5. Severe aortic atherosclerosis with extensive irregular mural thrombus. Aortic Atherosclerosis (ICD10-I70.0). Electronically Signed   By: AEddie CandleM.D.   On: 10/25/2019 16:59   ASSESSMENT AND PLAN:  This is a very pleasant 84years old white male with unresectable a stage IIb non-small cell lung cancer, adenocarcinoma with EGFR amplification and 2 separate lesions in the right upper lobe status post stereotactic radiotherapy.  The patient has been on observation for several years and he is feeling fine. He had repeat CT scan of the chest performed recently.  I personally and independently reviewed the scans and discussed the results with the patient today. His scan showed no concerning findings for disease recurrence or metastasis. I recommended for him to continue on observation with repeat CT scan of the chest in 1 year. He was advised to call immediately if he has any concerning symptoms in the interval. The patient voices understanding of current disease status and treatment options and is in agreement with the current care plan. All  questions were answered. The patient knows to call the clinic with any problems, questions or concerns. We can certainly see the patient much sooner if necessary.   Disclaimer: This note was dictated with voice recognition software. Similar sounding words can inadvertently be transcribed and may not be corrected upon review.

## 2019-10-26 NOTE — Telephone Encounter (Signed)
Scheduled per 8/25 los. Printed avs and calendar for pt.

## 2019-11-01 DIAGNOSIS — Z79891 Long term (current) use of opiate analgesic: Secondary | ICD-10-CM | POA: Diagnosis not present

## 2019-11-01 DIAGNOSIS — J449 Chronic obstructive pulmonary disease, unspecified: Secondary | ICD-10-CM | POA: Diagnosis not present

## 2019-11-01 DIAGNOSIS — I480 Paroxysmal atrial fibrillation: Secondary | ICD-10-CM | POA: Diagnosis not present

## 2019-11-01 DIAGNOSIS — E039 Hypothyroidism, unspecified: Secondary | ICD-10-CM | POA: Diagnosis not present

## 2019-11-09 DIAGNOSIS — Z6821 Body mass index (BMI) 21.0-21.9, adult: Secondary | ICD-10-CM | POA: Diagnosis not present

## 2019-11-09 DIAGNOSIS — I10 Essential (primary) hypertension: Secondary | ICD-10-CM | POA: Diagnosis not present

## 2019-11-09 DIAGNOSIS — F419 Anxiety disorder, unspecified: Secondary | ICD-10-CM | POA: Diagnosis not present

## 2019-11-09 DIAGNOSIS — Z1389 Encounter for screening for other disorder: Secondary | ICD-10-CM | POA: Diagnosis not present

## 2019-11-09 DIAGNOSIS — I4891 Unspecified atrial fibrillation: Secondary | ICD-10-CM | POA: Diagnosis not present

## 2019-12-01 DIAGNOSIS — I480 Paroxysmal atrial fibrillation: Secondary | ICD-10-CM | POA: Diagnosis not present

## 2019-12-01 DIAGNOSIS — E039 Hypothyroidism, unspecified: Secondary | ICD-10-CM | POA: Diagnosis not present

## 2019-12-01 DIAGNOSIS — J449 Chronic obstructive pulmonary disease, unspecified: Secondary | ICD-10-CM | POA: Diagnosis not present

## 2019-12-22 DIAGNOSIS — E039 Hypothyroidism, unspecified: Secondary | ICD-10-CM | POA: Diagnosis not present

## 2019-12-22 DIAGNOSIS — R7309 Other abnormal glucose: Secondary | ICD-10-CM | POA: Diagnosis not present

## 2019-12-22 DIAGNOSIS — Z1389 Encounter for screening for other disorder: Secondary | ICD-10-CM | POA: Diagnosis not present

## 2019-12-22 DIAGNOSIS — J449 Chronic obstructive pulmonary disease, unspecified: Secondary | ICD-10-CM | POA: Diagnosis not present

## 2019-12-22 DIAGNOSIS — E782 Mixed hyperlipidemia: Secondary | ICD-10-CM | POA: Diagnosis not present

## 2019-12-22 DIAGNOSIS — Z6821 Body mass index (BMI) 21.0-21.9, adult: Secondary | ICD-10-CM | POA: Diagnosis not present

## 2019-12-22 DIAGNOSIS — Z125 Encounter for screening for malignant neoplasm of prostate: Secondary | ICD-10-CM | POA: Diagnosis not present

## 2019-12-22 DIAGNOSIS — Z23 Encounter for immunization: Secondary | ICD-10-CM | POA: Diagnosis not present

## 2019-12-22 DIAGNOSIS — Z0001 Encounter for general adult medical examination with abnormal findings: Secondary | ICD-10-CM | POA: Diagnosis not present

## 2019-12-22 DIAGNOSIS — I10 Essential (primary) hypertension: Secondary | ICD-10-CM | POA: Diagnosis not present

## 2019-12-22 DIAGNOSIS — E7849 Other hyperlipidemia: Secondary | ICD-10-CM | POA: Diagnosis not present

## 2019-12-22 DIAGNOSIS — Z1331 Encounter for screening for depression: Secondary | ICD-10-CM | POA: Diagnosis not present

## 2019-12-31 DIAGNOSIS — I480 Paroxysmal atrial fibrillation: Secondary | ICD-10-CM | POA: Diagnosis not present

## 2019-12-31 DIAGNOSIS — J449 Chronic obstructive pulmonary disease, unspecified: Secondary | ICD-10-CM | POA: Diagnosis not present

## 2019-12-31 DIAGNOSIS — E039 Hypothyroidism, unspecified: Secondary | ICD-10-CM | POA: Diagnosis not present

## 2020-01-12 DIAGNOSIS — Z961 Presence of intraocular lens: Secondary | ICD-10-CM | POA: Diagnosis not present

## 2020-01-31 DIAGNOSIS — E039 Hypothyroidism, unspecified: Secondary | ICD-10-CM | POA: Diagnosis not present

## 2020-01-31 DIAGNOSIS — I480 Paroxysmal atrial fibrillation: Secondary | ICD-10-CM | POA: Diagnosis not present

## 2020-01-31 DIAGNOSIS — J449 Chronic obstructive pulmonary disease, unspecified: Secondary | ICD-10-CM | POA: Diagnosis not present

## 2020-03-02 DIAGNOSIS — E039 Hypothyroidism, unspecified: Secondary | ICD-10-CM | POA: Diagnosis not present

## 2020-03-02 DIAGNOSIS — I480 Paroxysmal atrial fibrillation: Secondary | ICD-10-CM | POA: Diagnosis not present

## 2020-03-02 DIAGNOSIS — J449 Chronic obstructive pulmonary disease, unspecified: Secondary | ICD-10-CM | POA: Diagnosis not present

## 2020-03-31 DIAGNOSIS — J449 Chronic obstructive pulmonary disease, unspecified: Secondary | ICD-10-CM | POA: Diagnosis not present

## 2020-03-31 DIAGNOSIS — E039 Hypothyroidism, unspecified: Secondary | ICD-10-CM | POA: Diagnosis not present

## 2020-03-31 DIAGNOSIS — I480 Paroxysmal atrial fibrillation: Secondary | ICD-10-CM | POA: Diagnosis not present

## 2020-04-30 DIAGNOSIS — E039 Hypothyroidism, unspecified: Secondary | ICD-10-CM | POA: Diagnosis not present

## 2020-04-30 DIAGNOSIS — J449 Chronic obstructive pulmonary disease, unspecified: Secondary | ICD-10-CM | POA: Diagnosis not present

## 2020-04-30 DIAGNOSIS — I480 Paroxysmal atrial fibrillation: Secondary | ICD-10-CM | POA: Diagnosis not present

## 2020-05-24 DIAGNOSIS — F419 Anxiety disorder, unspecified: Secondary | ICD-10-CM | POA: Diagnosis not present

## 2020-08-07 DIAGNOSIS — Z1331 Encounter for screening for depression: Secondary | ICD-10-CM | POA: Diagnosis not present

## 2020-08-07 DIAGNOSIS — Z1389 Encounter for screening for other disorder: Secondary | ICD-10-CM | POA: Diagnosis not present

## 2020-08-07 DIAGNOSIS — I480 Paroxysmal atrial fibrillation: Secondary | ICD-10-CM | POA: Diagnosis not present

## 2020-08-07 DIAGNOSIS — E785 Hyperlipidemia, unspecified: Secondary | ICD-10-CM | POA: Diagnosis not present

## 2020-08-07 DIAGNOSIS — E039 Hypothyroidism, unspecified: Secondary | ICD-10-CM | POA: Diagnosis not present

## 2020-08-07 DIAGNOSIS — C3490 Malignant neoplasm of unspecified part of unspecified bronchus or lung: Secondary | ICD-10-CM | POA: Diagnosis not present

## 2020-08-07 DIAGNOSIS — E7849 Other hyperlipidemia: Secondary | ICD-10-CM | POA: Diagnosis not present

## 2020-08-07 DIAGNOSIS — R7309 Other abnormal glucose: Secondary | ICD-10-CM | POA: Diagnosis not present

## 2020-08-07 DIAGNOSIS — Z6820 Body mass index (BMI) 20.0-20.9, adult: Secondary | ICD-10-CM | POA: Diagnosis not present

## 2020-09-03 DIAGNOSIS — Z20822 Contact with and (suspected) exposure to covid-19: Secondary | ICD-10-CM | POA: Diagnosis not present

## 2020-09-30 DIAGNOSIS — J449 Chronic obstructive pulmonary disease, unspecified: Secondary | ICD-10-CM | POA: Diagnosis not present

## 2020-09-30 DIAGNOSIS — E039 Hypothyroidism, unspecified: Secondary | ICD-10-CM | POA: Diagnosis not present

## 2020-09-30 DIAGNOSIS — I480 Paroxysmal atrial fibrillation: Secondary | ICD-10-CM | POA: Diagnosis not present

## 2020-10-22 ENCOUNTER — Other Ambulatory Visit: Payer: Self-pay

## 2020-10-22 ENCOUNTER — Inpatient Hospital Stay: Payer: Medicare Other | Attending: Internal Medicine

## 2020-10-22 ENCOUNTER — Encounter (HOSPITAL_COMMUNITY): Payer: Self-pay

## 2020-10-22 ENCOUNTER — Ambulatory Visit (HOSPITAL_COMMUNITY)
Admission: RE | Admit: 2020-10-22 | Discharge: 2020-10-22 | Disposition: A | Discharge status: Home / Self Care | Payer: Medicare Other | Source: Ambulatory Visit | Attending: Internal Medicine | Admitting: Internal Medicine

## 2020-10-22 DIAGNOSIS — Z79899 Other long term (current) drug therapy: Secondary | ICD-10-CM | POA: Insufficient documentation

## 2020-10-22 DIAGNOSIS — R0609 Other forms of dyspnea: Secondary | ICD-10-CM | POA: Insufficient documentation

## 2020-10-22 DIAGNOSIS — R918 Other nonspecific abnormal finding of lung field: Secondary | ICD-10-CM | POA: Diagnosis not present

## 2020-10-22 DIAGNOSIS — Z85038 Personal history of other malignant neoplasm of large intestine: Secondary | ICD-10-CM | POA: Insufficient documentation

## 2020-10-22 DIAGNOSIS — C3490 Malignant neoplasm of unspecified part of unspecified bronchus or lung: Secondary | ICD-10-CM

## 2020-10-22 DIAGNOSIS — K449 Diaphragmatic hernia without obstruction or gangrene: Secondary | ICD-10-CM | POA: Insufficient documentation

## 2020-10-22 DIAGNOSIS — R5383 Other fatigue: Secondary | ICD-10-CM | POA: Diagnosis not present

## 2020-10-22 DIAGNOSIS — J439 Emphysema, unspecified: Secondary | ICD-10-CM | POA: Insufficient documentation

## 2020-10-22 DIAGNOSIS — C3411 Malignant neoplasm of upper lobe, right bronchus or lung: Secondary | ICD-10-CM | POA: Diagnosis not present

## 2020-10-22 DIAGNOSIS — I714 Abdominal aortic aneurysm, without rupture: Secondary | ICD-10-CM | POA: Insufficient documentation

## 2020-10-22 DIAGNOSIS — Z886 Allergy status to analgesic agent status: Secondary | ICD-10-CM | POA: Diagnosis not present

## 2020-10-22 DIAGNOSIS — J432 Centrilobular emphysema: Secondary | ICD-10-CM | POA: Insufficient documentation

## 2020-10-22 DIAGNOSIS — Z9049 Acquired absence of other specified parts of digestive tract: Secondary | ICD-10-CM | POA: Insufficient documentation

## 2020-10-22 DIAGNOSIS — I700 Atherosclerosis of aorta: Secondary | ICD-10-CM | POA: Diagnosis not present

## 2020-10-22 DIAGNOSIS — R911 Solitary pulmonary nodule: Secondary | ICD-10-CM | POA: Diagnosis not present

## 2020-10-22 LAB — CMP (CANCER CENTER ONLY)
ALT: 7 U/L (ref 0–44)
AST: 16 U/L (ref 15–41)
Albumin: 3.5 g/dL (ref 3.5–5.0)
Alkaline Phosphatase: 82 U/L (ref 38–126)
Anion gap: 10 (ref 5–15)
BUN: 17 mg/dL (ref 8–23)
CO2: 35 mmol/L — ABNORMAL HIGH (ref 22–32)
Calcium: 8.7 mg/dL — ABNORMAL LOW (ref 8.9–10.3)
Chloride: 98 mmol/L (ref 98–111)
Creatinine: 1.43 mg/dL — ABNORMAL HIGH (ref 0.61–1.24)
GFR, Estimated: 49 mL/min — ABNORMAL LOW (ref >60)
Glucose, Bld: 98 mg/dL (ref 70–99)
Potassium: 4.7 mmol/L (ref 3.5–5.1)
Sodium: 143 mmol/L (ref 135–145)
Total Bilirubin: 0.6 mg/dL (ref 0.3–1.2)
Total Protein: 6.7 g/dL (ref 6.5–8.1)

## 2020-10-22 LAB — CBC WITH DIFFERENTIAL (CANCER CENTER ONLY)
Abs Immature Granulocytes: 0.02 10*3/uL (ref 0.00–0.07)
Basophils Absolute: 0 10*3/uL (ref 0.0–0.1)
Basophils Relative: 1 %
Eosinophils Absolute: 0.3 10*3/uL (ref 0.0–0.5)
Eosinophils Relative: 4 %
HCT: 43.2 % (ref 39.0–52.0)
Hemoglobin: 13.2 g/dL (ref 13.0–17.0)
Immature Granulocytes: 0 %
Lymphocytes Relative: 32 %
Lymphs Abs: 2.1 10*3/uL (ref 0.7–4.0)
MCH: 28.8 pg (ref 26.0–34.0)
MCHC: 30.6 g/dL (ref 30.0–36.0)
MCV: 94.1 fL (ref 80.0–100.0)
Monocytes Absolute: 0.5 10*3/uL (ref 0.1–1.0)
Monocytes Relative: 8 %
Neutro Abs: 3.5 10*3/uL (ref 1.7–7.7)
Neutrophils Relative %: 55 %
Platelet Count: 145 10*3/uL — ABNORMAL LOW (ref 150–400)
RBC: 4.59 MIL/uL (ref 4.22–5.81)
RDW: 13.5 % (ref 11.5–15.5)
WBC Count: 6.4 10*3/uL (ref 4.0–10.5)
nRBC: 0 % (ref 0.0–0.2)

## 2020-10-22 MED ORDER — IOHEXOL 350 MG/ML SOLN
60.0000 mL | Freq: Once | INTRAVENOUS | Status: AC | PRN
  Administered 2020-10-22: 60 mL via INTRAVENOUS

## 2020-10-24 ENCOUNTER — Inpatient Hospital Stay (HOSPITAL_BASED_OUTPATIENT_CLINIC_OR_DEPARTMENT_OTHER): Payer: Medicare Other | Admitting: Internal Medicine

## 2020-10-24 ENCOUNTER — Other Ambulatory Visit: Payer: Self-pay

## 2020-10-24 VITALS — BP 120/67 | HR 73 | Temp 97.3°F | Resp 19 | Ht 73.0 in | Wt 141.2 lb

## 2020-10-24 DIAGNOSIS — K449 Diaphragmatic hernia without obstruction or gangrene: Secondary | ICD-10-CM | POA: Diagnosis not present

## 2020-10-24 DIAGNOSIS — C3490 Malignant neoplasm of unspecified part of unspecified bronchus or lung: Secondary | ICD-10-CM

## 2020-10-24 DIAGNOSIS — R0609 Other forms of dyspnea: Secondary | ICD-10-CM | POA: Diagnosis not present

## 2020-10-24 DIAGNOSIS — R5383 Other fatigue: Secondary | ICD-10-CM | POA: Diagnosis not present

## 2020-10-24 DIAGNOSIS — C3411 Malignant neoplasm of upper lobe, right bronchus or lung: Secondary | ICD-10-CM | POA: Diagnosis not present

## 2020-10-24 DIAGNOSIS — I714 Abdominal aortic aneurysm, without rupture: Secondary | ICD-10-CM | POA: Diagnosis not present

## 2020-10-24 DIAGNOSIS — J432 Centrilobular emphysema: Secondary | ICD-10-CM | POA: Diagnosis not present

## 2020-10-24 NOTE — Progress Notes (Signed)
[facility] Telephone:(336) [phone]   Fax:(336) [phone] OFFICE PROGRESS NOTE  [name], [place] [address]  DIAGNOSIS: Unresectable he stage IIB (T3, N0, M0) non-small cell lung cancer, adenocarcinoma with positive EGFR amplification (G598V, G719A) diagnosed in March of 2015.  PRIOR THERAPY: Stereotactic radiotherapy to the right upper lobe pulmonary nodules under the care of Dr. Tammi Klippel completed on 07/26/2013.  CURRENT THERAPY: Observation.  INTERVAL HISTORY: Adam Ward 85 y.o. male returns to the clinic today for annual follow-up visit accompanied by his wife.  The patient is feeling fine today with no concerning complaints except for the baseline shortness of breath.  He denied having any current chest pain, cough or hemoptysis.  He denied having any fever or chills.  He has no nausea, vomiting, diarrhea or constipation.  He has no headache or visual changes.  He had repeat CT scan of the chest performed recently and he is here for evaluation and discussion of his scan results.  MEDICAL HISTORY: Past Medical History:  Diagnosis Date   Acute respiratory failure with hypoxia ([facility]) 07/26/2011   Anxiety    Bulla of lung ([facility]) 07/28/2011   Cavitary lesion of lung 07/28/2011   AFB smears x3 negative.   Colon cancer ([facility]) 1993   COPD (chronic obstructive pulmonary disease) ([facility])    GERD (gastroesophageal reflux disease)    Hx: of   Headache(784.0)    High cholesterol    Hypertension    Lung cancer ([facility])    Numbness and tingling    Hx: of in right hand and B/LLE   Pneumonia    Rapid atrial fibrillation ([facility]) 07/28/2011   Paroxysmal, newly diagnosed.   Tobacco abuse 08/01/2011    ALLERGIES:  is allergic to celebrex [celecoxib] and tetracyclines & related.  MEDICATIONS:  Current Outpatient Medications  Medication Sig Dispense Refill   alprazolam (XANAX) 2 MG tablet Take 2 mg by mouth 3 (three) times daily.     aspirin EC 81 MG  tablet Take 81 mg by mouth daily.     docusate sodium (COLACE) 100 MG capsule Take 100-200 mg by mouth daily as needed for mild constipation or moderate constipation.      furosemide (LASIX) 40 MG tablet TAKE (1) TABLET BY MOUTH TWICE DAILY. 60 tablet 6   levothyroxine (SYNTHROID, LEVOTHROID) 50 MCG tablet Take 50 mcg by mouth daily before breakfast.     potassium chloride (KLOR-CON) 10 MEQ tablet Take 10 mEq by mouth 2 (two) times daily.     simvastatin (ZOCOR) 20 MG tablet Take 20 mg by mouth daily.     No current facility-administered medications for this visit.    SURGICAL HISTORY:  Past Surgical History:  Procedure Laterality Date   APPENDECTOMY     CERVICAL DISC SURGERY     COLON SURGERY     EYE SURGERY     as a child   TONSILLECTOMY     VIDEO BRONCHOSCOPY WITH ENDOBRONCHIAL NAVIGATION N/A 05/18/2013   Procedure: VIDEO BRONCHOSCOPY WITH ENDOBRONCHIAL NAVIGATION;  Surgeon: [name], MD;  Location: [facility];  Service: Thoracic;  Laterality: N/A;    REVIEW OF SYSTEMS:  A comprehensive review of systems was negative except for: Constitutional: positive for fatigue Respiratory: positive for dyspnea on exertion   PHYSICAL EXAMINATION: General appearance: alert, cooperative, fatigued, and no distress Head: Normocephalic, without obvious abnormality, atraumatic Neck: no adenopathy, no JVD, supple, symmetrical, trachea midline, and thyroid not enlarged, symmetric, no  tenderness/mass/nodules Lymph nodes: Cervical, supraclavicular, and axillary nodes normal. Resp: clear to auscultation bilaterally Back: symmetric, no curvature. ROM normal. No CVA tenderness. Cardio: regular rate and rhythm, S1, S2 normal, no murmur, click, rub or gallop GI: soft, non-tender; bowel sounds normal; no masses,  no organomegaly Extremities: extremities normal, atraumatic, no cyanosis or edema  ECOG PERFORMANCE STATUS: 1 - Symptomatic but completely ambulatory  Blood pressure 120/67, pulse 73,  temperature (!) 97.3 F (36.3 C), temperature source Tympanic, resp. rate 19, height '6\' 1"'  (1.854 m), weight 141 lb 3.2 oz (64 kg), SpO2 94 %.  LABORATORY DATA: Lab Results  Component Value Date   WBC 6.4 10/22/2020   HGB 13.2 10/22/2020   HCT 43.2 10/22/2020   MCV 94.1 10/22/2020   PLT 145 (L) 10/22/2020      Chemistry      Component Value Date/Time   NA 143 10/22/2020 1245   NA 142 09/30/2016 1244   K 4.7 10/22/2020 1245   K 4.2 09/30/2016 1244   CL 98 10/22/2020 1245   CO2 35 (H) 10/22/2020 1245   CO2 35 (H) 09/30/2016 1244   BUN 17 10/22/2020 1245   BUN 21.7 09/30/2016 1244   CREATININE 1.43 (H) 10/22/2020 1245   CREATININE 1.1 09/30/2016 1244      Component Value Date/Time   CALCIUM 8.7 (L) 10/22/2020 1245   CALCIUM 9.1 09/30/2016 1244   ALKPHOS 82 10/22/2020 1245   ALKPHOS 112 09/30/2016 1244   AST 16 10/22/2020 1245   AST 21 09/30/2016 1244   ALT 7 10/22/2020 1245   ALT 7 09/30/2016 1244   BILITOT 0.6 10/22/2020 1245   BILITOT 0.44 09/30/2016 1244       RADIOGRAPHIC STUDIES: CT Chest W Contrast  Result Date: 10/23/2020 CLINICAL DATA:  Non-small-cell lung cancer.  Restaging. EXAM: CT CHEST WITH CONTRAST TECHNIQUE: Multidetector CT imaging of the chest was performed during intravenous contrast administration. CONTRAST:  53m OMNIPAQUE IOHEXOL 350 MG/ML SOLN COMPARISON:  10/25/2019 FINDINGS: Cardiovascular: The heart size is normal. No substantial pericardial effusion. Coronary artery calcification is evident. Moderate atherosclerotic calcification is noted in the wall of the thoracic aorta. Mediastinum/Nodes: Scattered upper normal mediastinal lymph nodes are mildly progressive in the interval. No evidence for gross hilar lymphadenopathy although assessment is limited by the lack of intravenous contrast on today's study. Tiny hiatal hernia. The esophagus has normal imaging features. There is no axillary lymphadenopathy. Lungs/Pleura: Centrilobular and paraseptal  emphysema evident. Biapical pleuroparenchymal scarring again noted. Bandlike focus of consolidative opacity with architectural distortion in the peripheral right upper lobe is stable, compatible with post treatment fibrosis. Ground-glass opacity posterior right lower lobe measured previously at 3.0 x 2.7 cm is 3.1 x 2.8 cm today. A second adjacent small ground-glass opacity in the right lower lobe is stable at 14 mm on image 91/5. 14 mm mixed attenuation but mainly ground-glass opacity in the left upper lobe on 60/5 is stable at 14 mm. No pleural effusion. Upper Abdomen: Infrarenal abdominal aorta measures up to 3.5 cm diameter but has been incompletely visualized. Musculoskeletal: No worrisome lytic or sclerotic osseous abnormality. IMPRESSION: 1. Stable appearance of post treatment fibrosis in the peripheral right upper lobe. No new or progressive findings to suggest recurrent or metastatic disease in the chest. 2. Bilateral ground-glass and mixed attenuation pulmonary nodules are stable in the interval. Continued attention on follow-up recommended. 3. 3.5 cm infrarenal abdominal aortic aneurysm, incompletely visualized. Recommend follow-up ultrasound every 2 years. This recommendation follows ACR consensus guidelines: White  Paper of the ACR Incidental Findings Committee II on Vascular Findings. J Am Coll Radiol 2013; 79:432-761. 4. Aortic Atherosclerosis (ICD10-I70.0) and Emphysema (ICD10-J43.9). Electronically Signed   By: Misty Stanley M.D.   On: 10/23/2020 09:50    ASSESSMENT AND PLAN:  This is a very pleasant 85 years old white male with unresectable a stage IIb non-small cell lung cancer, adenocarcinoma with EGFR amplification and 2 separate lesions in the right upper lobe status post stereotactic radiotherapy. The patient has been in observation since that time with no concerning findings for disease recurrence or metastasis. He has repeat CT scan of the chest performed recently that showed no  concerning finding for disease progression. I recommended for the patient to continue on observation with repeat CT scan of the chest in 1 year. The patient was advised to call immediately if he has any concerning symptoms in the interval. All questions were answered. The patient knows to call the clinic with any problems, questions or concerns. We can certainly see the patient much sooner if necessary.   Disclaimer: This note was dictated with voice recognition software. Similar sounding words can inadvertently be transcribed and may not be corrected upon review.

## 2020-10-31 DIAGNOSIS — E039 Hypothyroidism, unspecified: Secondary | ICD-10-CM | POA: Diagnosis not present

## 2020-10-31 DIAGNOSIS — I480 Paroxysmal atrial fibrillation: Secondary | ICD-10-CM | POA: Diagnosis not present

## 2020-10-31 DIAGNOSIS — Z6820 Body mass index (BMI) 20.0-20.9, adult: Secondary | ICD-10-CM | POA: Diagnosis not present

## 2020-10-31 DIAGNOSIS — J449 Chronic obstructive pulmonary disease, unspecified: Secondary | ICD-10-CM | POA: Diagnosis not present

## 2020-10-31 DIAGNOSIS — Z23 Encounter for immunization: Secondary | ICD-10-CM | POA: Diagnosis not present

## 2020-10-31 DIAGNOSIS — F419 Anxiety disorder, unspecified: Secondary | ICD-10-CM | POA: Diagnosis not present

## 2020-11-06 DIAGNOSIS — Z23 Encounter for immunization: Secondary | ICD-10-CM | POA: Diagnosis not present

## 2021-01-09 DIAGNOSIS — Z20828 Contact with and (suspected) exposure to other viral communicable diseases: Secondary | ICD-10-CM | POA: Diagnosis not present

## 2021-01-14 DIAGNOSIS — Z961 Presence of intraocular lens: Secondary | ICD-10-CM | POA: Diagnosis not present

## 2021-02-18 ENCOUNTER — Emergency Department (HOSPITAL_COMMUNITY)
Admission: EM | Admit: 2021-02-18 | Discharge: 2021-02-18 | Disposition: A | Discharge status: Home / Self Care | Payer: Medicare Other | Attending: Emergency Medicine | Admitting: Emergency Medicine

## 2021-02-18 ENCOUNTER — Emergency Department (HOSPITAL_COMMUNITY): Payer: Medicare Other

## 2021-02-18 ENCOUNTER — Other Ambulatory Visit: Payer: Self-pay

## 2021-02-18 ENCOUNTER — Encounter (HOSPITAL_COMMUNITY): Payer: Self-pay

## 2021-02-18 DIAGNOSIS — S40012A Contusion of left shoulder, initial encounter: Secondary | ICD-10-CM | POA: Diagnosis not present

## 2021-02-18 DIAGNOSIS — Z87891 Personal history of nicotine dependence: Secondary | ICD-10-CM | POA: Insufficient documentation

## 2021-02-18 DIAGNOSIS — S40022A Contusion of left upper arm, initial encounter: Secondary | ICD-10-CM | POA: Diagnosis not present

## 2021-02-18 DIAGNOSIS — W010XXA Fall on same level from slipping, tripping and stumbling without subsequent striking against object, initial encounter: Secondary | ICD-10-CM | POA: Insufficient documentation

## 2021-02-18 DIAGNOSIS — I10 Essential (primary) hypertension: Secondary | ICD-10-CM | POA: Diagnosis not present

## 2021-02-18 DIAGNOSIS — M7989 Other specified soft tissue disorders: Secondary | ICD-10-CM | POA: Diagnosis not present

## 2021-02-18 DIAGNOSIS — Z7982 Long term (current) use of aspirin: Secondary | ICD-10-CM | POA: Insufficient documentation

## 2021-02-18 DIAGNOSIS — Z79899 Other long term (current) drug therapy: Secondary | ICD-10-CM | POA: Diagnosis not present

## 2021-02-18 DIAGNOSIS — R52 Pain, unspecified: Secondary | ICD-10-CM | POA: Diagnosis not present

## 2021-02-18 DIAGNOSIS — Z85118 Personal history of other malignant neoplasm of bronchus and lung: Secondary | ICD-10-CM | POA: Insufficient documentation

## 2021-02-18 DIAGNOSIS — Z85038 Personal history of other malignant neoplasm of large intestine: Secondary | ICD-10-CM | POA: Insufficient documentation

## 2021-02-18 DIAGNOSIS — R0902 Hypoxemia: Secondary | ICD-10-CM | POA: Diagnosis not present

## 2021-02-18 DIAGNOSIS — J449 Chronic obstructive pulmonary disease, unspecified: Secondary | ICD-10-CM | POA: Insufficient documentation

## 2021-02-18 DIAGNOSIS — S4992XA Unspecified injury of left shoulder and upper arm, initial encounter: Secondary | ICD-10-CM | POA: Diagnosis present

## 2021-02-18 DIAGNOSIS — M25519 Pain in unspecified shoulder: Secondary | ICD-10-CM | POA: Diagnosis not present

## 2021-02-18 DIAGNOSIS — J439 Emphysema, unspecified: Secondary | ICD-10-CM | POA: Diagnosis not present

## 2021-02-18 DIAGNOSIS — W19XXXA Unspecified fall, initial encounter: Secondary | ICD-10-CM | POA: Diagnosis not present

## 2021-02-18 NOTE — ED Triage Notes (Signed)
Patient brought in via ems from home. Patient had mechanical fall from bed due to rug slipping. Patient c/o left shoulder and arm pain. VSS. Denies LOC or head injury. A&Ox4

## 2021-02-18 NOTE — Discharge Instructions (Signed)
Please call your doctor's office about your oxygen level and use of your oxygen machine at home.

## 2021-02-18 NOTE — ED Provider Notes (Signed)
[facility] EMERGENCY DEPARTMENT Provider Note   CSN: [id number] Arrival date & time: 02/18/21  1012     History Chief Complaint  Patient presents with   Lanna Poche A Viana is a 85 y.o. male with history of hypertension, hyperlipidemia, dementia, presenting from home with a mechanical fall.  EMS reports the patient was wearing socks and slipped on the hardwood floors today, out of bed and landing on his left side.  He was complaining of pain near his left shoulder and left clavicle.  EMS reports that he had good range of motion with arrival.  He was able to walk.  There is no pain of the hips.  EMS and patient denies striking his head or loss of consciousness.  He is not on blood thinners  Per his medical chart review, he does have a history of "Unresectable he stage IIB (T3, N0, M0) non-small cell lung cancer, adenocarcinoma with positive EGFR amplification (G598V, G719A) diagnosed in March of 2015."  HPI     Past Medical History:  Diagnosis Date   Acute respiratory failure with hypoxia ([place]) 07/26/2011   Anxiety    Bulla of lung ([place]) 07/28/2011   Cavitary lesion of lung 07/28/2011   AFB smears x3 negative.   Colon cancer ([place]) 1993   COPD (chronic obstructive pulmonary disease) (HCC)    GERD (gastroesophageal reflux disease)    Hx: of   Headache(784.0)    High cholesterol    Hypertension    Lung cancer (HCC)    Numbness and tingling    Hx: of in right hand and B/LLE   Pneumonia    Rapid atrial fibrillation ([place]) 07/28/2011   Paroxysmal, newly diagnosed.   Tobacco abuse 08/01/2011    Patient Active Problem List   Diagnosis Date Noted   Thrombocytopenia ([place]) 07/26/2014   AKI (acute kidney injury) ([place]) 07/25/2014   Paroxysmal A-fib ([place]) 07/25/2014   Elevated troponin 07/25/2014   Generalized weakness 07/25/2014   RBBB 07/25/2014   Pure hypercholesterolemia 07/06/2012   Tobacco abuse 08/01/2011   Acute bronchitis 07/28/2011   Lung cancer, right  upper lobe  07/28/2011   Bulla of lung ([place]) 07/28/2011   Cavitary lesion of lung 07/28/2011   Rapid atrial fibrillation ([place]) 07/28/2011   Hyperglycemia 07/27/2011   Elevated brain natriuretic peptide (BNP) level 07/27/2011   CAP (community acquired pneumonia) 07/26/2011   Acute respiratory failure with hypoxia ([place]) 07/26/2011   Hypertension    Anxiety     Past Surgical History:  Procedure Laterality Date   APPENDECTOMY     CERVICAL DISC SURGERY     COLON SURGERY     EYE SURGERY     as a child   TONSILLECTOMY     VIDEO BRONCHOSCOPY WITH ENDOBRONCHIAL NAVIGATION N/A 05/18/2013   Procedure: VIDEO BRONCHOSCOPY WITH ENDOBRONCHIAL NAVIGATION;  Surgeon: [name], MD;  Location: [facility] OR;  Service: Thoracic;  Laterality: N/A;       Family History  Problem Relation Age of Onset   Heart disease Mother    Stroke Father    Pneumonia Sister    Heart disease Brother     Social History   Tobacco Use   Smoking status: Former    Packs/day: 1.00    Years: 50.00    Pack years: 50.00    Types: Cigarettes    Quit date: 07/16/2011    Years since quitting: 9.6   Smokeless tobacco: Never   Tobacco comments:    started  using electric cigarettes in 06-2010  Vaping Use   Vaping Use: Never used  Substance Use Topics   Alcohol use: No   Drug use: No    Home Medications Prior to Admission medications   Medication Sig Start Date End Date Taking? Authorizing Provider  alprazolam Duanne Moron) 2 MG tablet Take 2 mg by mouth 3 (three) times daily.   Yes [provider]  aspirin EC 81 MG tablet Take 81 mg by mouth daily.   Yes [provider]  docusate sodium (COLACE) 100 MG capsule Take 100-200 mg by mouth daily as needed for mild constipation or moderate constipation.    Yes [provider]  furosemide (LASIX) 40 MG tablet TAKE (1) TABLET BY MOUTH TWICE DAILY. Patient taking differently: Take 40 mg by mouth daily. 04/26/19  Yes Herminio Commons, MD  levothyroxine  (SYNTHROID, LEVOTHROID) 50 MCG tablet Take 50 mcg by mouth daily before breakfast.   Yes [provider]  potassium chloride (KLOR-CON) 10 MEQ tablet Take 10 mEq by mouth 2 (two) times daily. 09/27/19  Yes [provider]  simvastatin (ZOCOR) 20 MG tablet Take 20 mg by mouth daily.   Yes [provider]    Allergies    Celebrex [celecoxib] and Tetracyclines & related  Review of Systems   Review of Systems  Constitutional:  Negative for chills and fever.  HENT:  Negative for ear pain and sore throat.   Eyes:  Negative for photophobia and visual disturbance.  Respiratory:  Negative for cough and shortness of breath.   Cardiovascular:  Negative for chest pain and palpitations.  Gastrointestinal:  Negative for abdominal pain and vomiting.  Genitourinary:  Negative for dysuria and hematuria.  Musculoskeletal:  Negative for arthralgias and back pain.  Skin:  Negative for color change and rash.  Neurological:  Negative for syncope and headaches.  All other systems reviewed and are negative.  Physical Exam Updated Vital Signs BP 105/61    Pulse 77    Temp (!) 96.8 F (36 C) (Oral)    Resp 17    Ht '6\' 1"'  (1.854 m)    SpO2 97%    BMI 18.63 kg/m   Physical Exam Constitutional:      General: He is not in acute distress. HENT:     Head: Normocephalic and atraumatic.  Eyes:     Conjunctiva/sclera: Conjunctivae normal.     Pupils: Pupils are equal, round, and reactive to light.  Cardiovascular:     Rate and Rhythm: Normal rate and regular rhythm.  Pulmonary:     Effort: Pulmonary effort is normal. No respiratory distress.  Abdominal:     General: There is no distension.     Tenderness: There is no abdominal tenderness.  Musculoskeletal:     Comments: Ecchymosis of left upper arm, no deformity or tenderness of the shoudler or pain Full ROM of the bilateral arms  Skin:    General: Skin is warm and dry.  Neurological:     General: No focal deficit present.      Mental Status: He is alert. Mental status is at baseline.    ED Results / Procedures / Treatments   Labs (all labs ordered are listed, but only abnormal results are displayed) Labs Reviewed - No data to display  EKG None  Radiology DG Chest Portable 1 View  Result Date: 02/18/2021 CLINICAL DATA:  Bruising LEFT arm, clavicle tender to palpation, fall onto shoulder EXAM: PORTABLE CHEST 1 VIEW COMPARISON:  Portable  exam 1038 hours compared to 07/25/2014 Correlation: CT chest 10/22/2020 FINDINGS: Normal heart size, mediastinal contours, and pulmonary vascularity. Atherosclerotic calcification aorta. Emphysematous and bronchitic changes consistent with COPD. RIGHT upper lobe scarring identified, though the opacity associated with the scarring at the RIGHT upper lobe has increased and appears more masslike than seen in 2016 but unchanged from recent CT in 2022. No pulmonary infiltrate, pleural effusion, or pneumothorax. Bones appear demineralized. No definite fractures. Prior cervical fusion. IMPRESSION: COPD changes with increased focal opacity at scarring at the RIGHT upper lobe, similar to most recent CT. No acute abnormalities. Aortic Atherosclerosis (ICD10-I70.0) and Emphysema (ICD10-J43.9). Electronically Signed   By: Lavonia Dana M.D.   On: 02/18/2021 11:00   DG Shoulder Left  Result Date: 02/18/2021 CLINICAL DATA:  LEFT clavicular pain and tenderness, pain at sternoclavicular joint with swelling bruising at medial LEFT humerus, fall EXAM: LEFT SHOULDER - 2+ VIEW COMPARISON:  None FINDINGS: Osseous demineralization. AC joint alignment normal. No glenohumeral fracture or dislocation. Visualized LEFT ribs intact. Medial LEFT clavicle/LEFT clavicular head is severely limited assessment due to degree of osseous demineralization. Unable to assess integrity of the LEFT clavicular head. Extensive atherosclerotic calcifications at aorta and carotid bifurcations. IMPRESSION: No glenohumeral fracture or  dislocation. Osseous demineralization. Suboptimal assessment of the LEFT clavicular head due to degree of osseous demineralization and superimposed osseous structures; consider dedicated LEFT clavicular radiographs to assess. Electronically Signed   By: Lavonia Dana M.D.   On: 02/18/2021 11:03   DG Humerus Left  Result Date: 02/18/2021 CLINICAL DATA:  Fall onto LEFT shoulder, bruising upper LEFT arm, pain LEFT clavicle as sternal clavicular joint EXAM: LEFT HUMERUS - 2+ VIEW COMPARISON:  None FINDINGS: Osseous demineralization. Elbow and shoulder joint alignments normal. No acute fracture, dislocation, or bone destruction. IMPRESSION: No acute abnormalities. Electronically Signed   By: Lavonia Dana M.D.   On: 02/18/2021 11:00    Procedures Procedures   Medications Ordered in ED Medications - No data to display  ED Course  I have reviewed the triage vital signs and the nursing notes.  Pertinent labs & imaging results that were available during my care of the patient were reviewed by me and considered in my medical decision making (see chart for details).  Xrays reviewed - no acute fx visualized Pt's pain under control Ambulated steadily here No evidence of head injury on exam or per history - doubt ICH or CVA   Clinical Course as of 02/18/21 1711  Mon Feb 18, 2021  1025 85-90% on room air [MT]  1216 Patient's wife is now present at bedside.  Updated him regarding the results.  She reports the patient does have an oxygen machine at home but has not used it in "years," it was for his "prior lung cancer."  Will attempt to ambulate here [MT]  1254 Pt able to ambulate steadily on his own [MT]    Clinical Course User Index [MT] Marta Bouie, Carola Rhine, MD     Final Clinical Impression(s) / ED Diagnoses Final diagnoses:  Fall, initial encounter  Contusion of left upper extremity, initial encounter    Rx / DC Orders ED Discharge Orders     None        Jakera Beaupre, Carola Rhine, MD 02/18/21  1711

## 2021-02-21 DIAGNOSIS — I480 Paroxysmal atrial fibrillation: Secondary | ICD-10-CM | POA: Diagnosis not present

## 2021-02-21 DIAGNOSIS — Z681 Body mass index (BMI) 19 or less, adult: Secondary | ICD-10-CM | POA: Diagnosis not present

## 2021-02-21 DIAGNOSIS — I10 Essential (primary) hypertension: Secondary | ICD-10-CM | POA: Diagnosis not present

## 2021-02-21 DIAGNOSIS — E063 Autoimmune thyroiditis: Secondary | ICD-10-CM | POA: Diagnosis not present

## 2021-02-21 DIAGNOSIS — J449 Chronic obstructive pulmonary disease, unspecified: Secondary | ICD-10-CM | POA: Diagnosis not present

## 2021-02-21 DIAGNOSIS — S20219A Contusion of unspecified front wall of thorax, initial encounter: Secondary | ICD-10-CM | POA: Diagnosis not present

## 2021-03-06 DIAGNOSIS — Z0000 Encounter for general adult medical examination without abnormal findings: Secondary | ICD-10-CM | POA: Diagnosis not present

## 2021-03-06 DIAGNOSIS — F419 Anxiety disorder, unspecified: Secondary | ICD-10-CM | POA: Diagnosis not present

## 2021-03-06 DIAGNOSIS — Z681 Body mass index (BMI) 19 or less, adult: Secondary | ICD-10-CM | POA: Diagnosis not present

## 2021-03-06 DIAGNOSIS — E039 Hypothyroidism, unspecified: Secondary | ICD-10-CM | POA: Diagnosis not present

## 2021-03-06 DIAGNOSIS — S20219A Contusion of unspecified front wall of thorax, initial encounter: Secondary | ICD-10-CM | POA: Diagnosis not present

## 2021-03-06 DIAGNOSIS — R7309 Other abnormal glucose: Secondary | ICD-10-CM | POA: Diagnosis not present

## 2021-03-06 DIAGNOSIS — I480 Paroxysmal atrial fibrillation: Secondary | ICD-10-CM | POA: Diagnosis not present

## 2021-03-06 DIAGNOSIS — E785 Hyperlipidemia, unspecified: Secondary | ICD-10-CM | POA: Diagnosis not present

## 2021-03-06 DIAGNOSIS — Z1331 Encounter for screening for depression: Secondary | ICD-10-CM | POA: Diagnosis not present

## 2021-03-06 DIAGNOSIS — J449 Chronic obstructive pulmonary disease, unspecified: Secondary | ICD-10-CM | POA: Diagnosis not present

## 2021-03-06 DIAGNOSIS — I4891 Unspecified atrial fibrillation: Secondary | ICD-10-CM | POA: Diagnosis not present

## 2021-03-06 DIAGNOSIS — E782 Mixed hyperlipidemia: Secondary | ICD-10-CM | POA: Diagnosis not present

## 2021-03-06 DIAGNOSIS — I10 Essential (primary) hypertension: Secondary | ICD-10-CM | POA: Diagnosis not present

## 2021-03-11 DIAGNOSIS — F419 Anxiety disorder, unspecified: Secondary | ICD-10-CM | POA: Diagnosis not present

## 2021-03-11 DIAGNOSIS — Z681 Body mass index (BMI) 19 or less, adult: Secondary | ICD-10-CM | POA: Diagnosis not present

## 2021-04-08 DIAGNOSIS — Z20822 Contact with and (suspected) exposure to covid-19: Secondary | ICD-10-CM | POA: Diagnosis not present

## 2021-06-11 DIAGNOSIS — Z681 Body mass index (BMI) 19 or less, adult: Secondary | ICD-10-CM | POA: Diagnosis not present

## 2021-06-11 DIAGNOSIS — E039 Hypothyroidism, unspecified: Secondary | ICD-10-CM | POA: Diagnosis not present

## 2021-06-17 DIAGNOSIS — Z20822 Contact with and (suspected) exposure to covid-19: Secondary | ICD-10-CM | POA: Diagnosis not present

## 2021-08-31 DEATH — deceased

## 2021-10-22 ENCOUNTER — Inpatient Hospital Stay: Payer: Self-pay | Attending: Internal Medicine

## 2021-10-24 ENCOUNTER — Inpatient Hospital Stay: Payer: Self-pay | Admitting: Internal Medicine
